# Patient Record
Sex: Female | Born: 1955
Health system: Southern US, Community
[De-identification: ages and names within clinical notes are randomized; demographics above are authoritative.]

## PROBLEM LIST (undated history)

## (undated) DIAGNOSIS — I10 Essential (primary) hypertension: Secondary | ICD-10-CM

## (undated) DIAGNOSIS — K219 Gastro-esophageal reflux disease without esophagitis: Secondary | ICD-10-CM

## (undated) DIAGNOSIS — R519 Headache, unspecified: Secondary | ICD-10-CM

## (undated) DIAGNOSIS — R51 Headache: Secondary | ICD-10-CM

## (undated) DIAGNOSIS — R32 Unspecified urinary incontinence: Secondary | ICD-10-CM

## (undated) HISTORY — DX: Unspecified urinary incontinence: R32

## (undated) HISTORY — DX: Headache: R51

## (undated) HISTORY — DX: Gastro-esophageal reflux disease without esophagitis: K21.9

## (undated) HISTORY — DX: Essential (primary) hypertension: I10

## (undated) HISTORY — DX: Headache, unspecified: R51.9

## (undated) HISTORY — PX: TUBAL LIGATION: SHX77

---

## 1983-12-01 HISTORY — PX: ABDOMINAL HYSTERECTOMY: SHX81

## 2006-08-09 ENCOUNTER — Ambulatory Visit: Payer: Self-pay | Admitting: Internal Medicine

## 2006-09-20 ENCOUNTER — Ambulatory Visit: Payer: Self-pay | Admitting: Internal Medicine

## 2006-09-21 ENCOUNTER — Ambulatory Visit: Payer: Self-pay | Admitting: Internal Medicine

## 2008-04-24 ENCOUNTER — Ambulatory Visit: Payer: Self-pay

## 2011-11-03 ENCOUNTER — Encounter: Payer: Self-pay | Admitting: Internal Medicine

## 2011-12-29 ENCOUNTER — Ambulatory Visit: Payer: Self-pay

## 2012-01-07 ENCOUNTER — Ambulatory Visit: Payer: Self-pay

## 2013-08-15 ENCOUNTER — Encounter: Payer: Self-pay | Admitting: Internal Medicine

## 2013-08-21 ENCOUNTER — Encounter: Payer: Self-pay | Admitting: Internal Medicine

## 2013-12-12 ENCOUNTER — Encounter: Payer: Self-pay | Admitting: Adult Health

## 2013-12-12 ENCOUNTER — Encounter (INDEPENDENT_AMBULATORY_CARE_PROVIDER_SITE_OTHER): Payer: No Typology Code available for payment source | Admitting: Adult Health

## 2013-12-12 NOTE — Progress Notes (Signed)
Pre visit review using our clinic review tool, if applicable. No additional management support is needed unless otherwise documented below in the visit note. 

## 2013-12-14 NOTE — Progress Notes (Signed)
This encounter was created in error - please disregard.  This encounter was created in error - please disregard.

## 2014-01-03 ENCOUNTER — Ambulatory Visit: Payer: No Typology Code available for payment source | Admitting: Adult Health

## 2014-05-15 ENCOUNTER — Ambulatory Visit: Payer: Self-pay | Admitting: Pain Medicine

## 2014-06-07 ENCOUNTER — Ambulatory Visit: Payer: Self-pay | Admitting: Gastroenterology

## 2014-12-01 ENCOUNTER — Inpatient Hospital Stay: Payer: Self-pay | Admitting: Internal Medicine

## 2014-12-01 LAB — CBC WITH DIFFERENTIAL/PLATELET
BASOS ABS: 0.2 10*3/uL — AB (ref 0.0–0.1)
Basophil %: 1 %
EOS ABS: 0.1 10*3/uL (ref 0.0–0.7)
Eosinophil %: 0.5 %
HCT: 28.9 % — ABNORMAL LOW (ref 35.0–47.0)
HGB: 9.6 g/dL — AB (ref 12.0–16.0)
LYMPHS PCT: 17.2 %
Lymphocyte #: 3 10*3/uL (ref 1.0–3.6)
MCH: 29.6 pg (ref 26.0–34.0)
MCHC: 33.1 g/dL (ref 32.0–36.0)
MCV: 90 fL (ref 80–100)
MONO ABS: 0.7 x10 3/mm (ref 0.2–0.9)
Monocyte %: 4 %
NEUTROS PCT: 77.3 %
Neutrophil #: 13.6 10*3/uL — ABNORMAL HIGH (ref 1.4–6.5)
PLATELETS: 359 10*3/uL (ref 150–440)
RBC: 3.23 10*6/uL — AB (ref 3.80–5.20)
RDW: 13.2 % (ref 11.5–14.5)
WBC: 17.6 10*3/uL — ABNORMAL HIGH (ref 3.6–11.0)

## 2014-12-01 LAB — PROTIME-INR
INR: 1
Prothrombin Time: 13.3 secs (ref 11.5–14.7)

## 2014-12-01 LAB — COMPREHENSIVE METABOLIC PANEL
ANION GAP: 10 (ref 7–16)
AST: 28 U/L (ref 15–37)
Albumin: 3.6 g/dL (ref 3.4–5.0)
Alkaline Phosphatase: 94 U/L
BILIRUBIN TOTAL: 0.2 mg/dL (ref 0.2–1.0)
BUN: 52 mg/dL — ABNORMAL HIGH (ref 7–18)
CALCIUM: 8.8 mg/dL (ref 8.5–10.1)
CO2: 25 mmol/L (ref 21–32)
CREATININE: 1.35 mg/dL — AB (ref 0.60–1.30)
Chloride: 101 mmol/L (ref 98–107)
EGFR (Non-African Amer.): 43 — ABNORMAL LOW
GFR CALC AF AMER: 52 — AB
Glucose: 150 mg/dL — ABNORMAL HIGH (ref 65–99)
Osmolality: 289 (ref 275–301)
Potassium: 4.1 mmol/L (ref 3.5–5.1)
SGPT (ALT): 30 U/L
Sodium: 136 mmol/L (ref 136–145)
TOTAL PROTEIN: 7.5 g/dL (ref 6.4–8.2)

## 2014-12-01 LAB — HEMOGLOBIN: HGB: 6.7 g/dL — AB (ref 12.0–16.0)

## 2014-12-01 LAB — LIPASE, BLOOD: LIPASE: 137 U/L (ref 73–393)

## 2014-12-01 LAB — APTT: Activated PTT: 27.6 secs (ref 23.6–35.9)

## 2014-12-02 LAB — CBC WITH DIFFERENTIAL/PLATELET
BASOS ABS: 0.1 10*3/uL (ref 0.0–0.1)
Basophil %: 0.6 %
Eosinophil #: 0.2 10*3/uL (ref 0.0–0.7)
Eosinophil %: 1.6 %
HCT: 31.3 % — AB (ref 35.0–47.0)
HGB: 10.6 g/dL — ABNORMAL LOW (ref 12.0–16.0)
LYMPHS ABS: 3.8 10*3/uL — AB (ref 1.0–3.6)
Lymphocyte %: 34.4 %
MCH: 31.1 pg (ref 26.0–34.0)
MCHC: 33.8 g/dL (ref 32.0–36.0)
MCV: 92 fL (ref 80–100)
MONO ABS: 0.8 x10 3/mm (ref 0.2–0.9)
Monocyte %: 7.2 %
Neutrophil #: 6.2 10*3/uL (ref 1.4–6.5)
Neutrophil %: 56.2 %
PLATELETS: 214 10*3/uL (ref 150–440)
RBC: 3.4 10*6/uL — ABNORMAL LOW (ref 3.80–5.20)
RDW: 13.3 % (ref 11.5–14.5)
WBC: 11 10*3/uL (ref 3.6–11.0)

## 2014-12-02 LAB — HEMOGLOBIN: HGB: 10 g/dL — ABNORMAL LOW (ref 12.0–16.0)

## 2014-12-02 LAB — BASIC METABOLIC PANEL
ANION GAP: 6 — AB (ref 7–16)
BUN: 31 mg/dL — ABNORMAL HIGH (ref 7–18)
CALCIUM: 7.6 mg/dL — AB (ref 8.5–10.1)
Chloride: 113 mmol/L — ABNORMAL HIGH (ref 98–107)
Co2: 21 mmol/L (ref 21–32)
Creatinine: 0.97 mg/dL (ref 0.60–1.30)
EGFR (Non-African Amer.): >60
Glucose: 96 mg/dL (ref 65–99)
Osmolality: 286 (ref 275–301)
Potassium: 4.1 mmol/L (ref 3.5–5.1)
Sodium: 140 mmol/L (ref 136–145)

## 2015-01-05 DIAGNOSIS — K922 Gastrointestinal hemorrhage, unspecified: Secondary | ICD-10-CM | POA: Insufficient documentation

## 2015-03-27 NOTE — Consult Note (Signed)
PATIENT NAME:  Priscilla Meza, Priscilla Meza MR#:  [id number] DATE OF BIRTH:  12-06-1955  DATE OF CONSULTATION:  12/02/2014  REFERRING PHYSICIAN:  Santina Evans P. Clent Ridges, MD  CONSULTING PHYSICIAN:  Dow Adolph, MD  REASON FOR CONSULTATION: Melena, hematemesis, anemia.   HISTORY OF PRESENT ILLNESS: Priscilla Meza is a 59 year old female with a past medical history notable for iron-deficiency anemia, hypertension and chronic back pain who is presenting to the Emergency Room for evaluation of melena, hematemesis, weakness and fatigue. Priscilla Meza does have a history of anemia. Of note, Priscilla Meza did undergo a colonoscopy and upper endoscopy back in July 2015 by Dr. Daleen Squibb for iron deficiency anemia. No source of anemia was found at that time. Priscilla Meza did have some mild gastritis on her upper endoscopy. On her colonoscopy, Priscilla Meza only had nonbleeding internal hemorrhoids.   Currently, Priscilla Meza reports that Priscilla Meza has had multiple episodes of black bowel movements in the past 24 hours. Priscilla Meza also had several episodes of dark brown/black vomitus. In addition, Priscilla Meza was feeling very weak, lightheaded and dizzy at home. Priscilla Meza presented to the Emergency Room and was found to be hypotensive in there with a systolic in the 90s. Her hemoglobin was 9.6 on presentation. Priscilla Meza does admit to taking intermittent Goody powder for her back pain.   Upon being admitted to the hospital, Priscilla Meza was noted to continue to be hypotensive. Priscilla Meza had a repeat hemoglobin of 6.9 and received 2 units of packed red blood cells with a response to 10.   PAST MEDICAL HISTORY:  1.  Iron-deficiency anemia.  2.  Gastritis.  3.  Hypertension.  4.  Shingles.  5.  Hyperlipidemia.  6.  Chronic low back pain.   PAST SURGICAL HISTORY: Just a hysterectomy.   SOCIAL HISTORY: Priscilla Meza was a former smoker. Priscilla Meza does now use vapor cigarette. Priscilla Meza denies alcohol.   FAMILY HISTORY: Priscilla Meza does have a grandmother with colon cancer.   ALLERGIES: No allergies.   HOME MEDICATIONS:  1.  Vitamin D 50,000  units daily.  2.  Omeprazole 20 mg daily.  3.  Norco 325/5 t.i.d.  4.  Lisinopril 20 mg daily.  5.  Hydrochlorothiazide 25 mg daily. 6.  Acyclovir 800 mg 5 times a day.   REVIEW OF SYSTEMS: A 10-system review was conducted and is negative except as stated in the HPI.   PHYSICAL EXAMINATION:  VITAL SIGNS: Currently, her temperature is 97.6, pulse 69, respirations 18, blood pressure 125/71, pulse oximetry 98% on room air.  GENERAL: Alert and oriented times 4.  No acute distress. Appears stated age. HEENT: Normocephalic/atraumatic. Extraocular movements are intact. Anicteric. NECK: Soft, supple. JVP appears normal. No adenopathy. CHEST: Clear to auscultation. No wheeze or crackle. Respirations unlabored. HEART: Regular. No murmur, rub, or gallop.  Normal S1 and S2. ABDOMEN: Soft, nontender, nondistended.  Normal active bowel sounds in all four quadrants.  No organomegaly. No masses EXTREMITIES: No swelling, well perfused. SKIN: No rash or lesion. Skin color, texture, turgor normal. NEUROLOGICAL: Grossly intact. PSYCHIATRIC: Normal tone and affect. MUSCULOSKELETAL: No joint swelling or erythema.    LABORATORY DATA: Currently her sodium is 140, potassium 4.1, BUN 31, creatinine 0.97. Lipase is normal. Liver enzymes are normal. White count 11, current hemoglobin 10.6, hematocrit 31, platelets 214,000. Her INR is 1.0.   ASSESSMENT:  Upper gastrointestinal bleed. Although Priscilla Meza did have an EGD back in July 2015, Priscilla Meza does appear to have had a significant upper GI bleed with a large drop in her hemoglobin from baseline and also  evidence of hemodynamic changes. Fortunately, Priscilla Meza has responded well to PPI drip and packed red blood cells.   RECOMMENDATIONS:  1.  N.p.o. after midnight.  2.  We will perform an EGD today for diagnostic and potentially therapeutic purposes.  3.  Continue PPI drip.  4.  Check H. pylori serology.  5.  Further recommendations pending above.   Thank you for this consult.     ____________________________ Dow AdolphMatthew Rein, MD mr:JT D: 12/02/2014 12:36:00 ET T: 12/02/2014 14:42:35 ET JOB#: [id number]  cc: Dow AdolphMatthew Rein, MD, <Dictator> Kathalene FramesMATTHEW G REIN MD ELECTRONICALLY SIGNED 12/03/2014 14:19

## 2015-03-27 NOTE — H&P (Signed)
PATIENT NAME:  Priscilla Meza, Priscilla Meza MR#:  [id number] DATE OF BIRTH:  July 21, 1956  DATE OF ADMISSION:  12/01/2014  PRIMARY CARE PHYSICIAN: Dr. Brooke Dare.   PRIMARY GASTROENTEROLOGIST: Midge Minium, MD.   REFERRING EMERGENCY ROOM PHYSICIAN: Glennie Isle, MD.   CHIEF COMPLAINT: Melena and hematochezia.   HISTORY OF PRESENT ILLNESS: This very pleasant, 59 year old woman with past medical history of iron-deficiency anemia, hypertension, gastritis seen on EGD in July of 2015, presents today with 24 hours of dark, black, tarry stools. She reports that she has had some abdominal cramping, multiple episodes of black stool, began to feel very weak. Today was so weak she was unable to move easily around her house without dizziness. She finally called EMS and when EMS arrived, she had 3 episodes of coffee-ground emesis. At the time of presentation to the emergency room, she is slightly hypotensive with a blood pressure of 97/62. Her hemoglobin is 9.6. She does report that she 2 Goody's powders daily and uses naproxen at least every other day for back pain.  PAST MEDICAL HISTORY: 1. Iron deficiency anemia.  2. Gastritis.  3. Hypertension.  4. Shingles.  5. Hyperlipidemia.  6. Chronic low back pain.   PAST SURGICAL HISTORY: Partial hysterectomy.   SOCIAL HISTORY: The patient lives alone. She smoked cigarettes until February of 2015 and now she uses a vapor cigarette. She does not drink alcohol. No illicit substance abuse. She is not currently working.   FAMILY MEDICAL HISTORY: Negative for coronary artery disease or stroke. One of her grandmothers had colon cancer. Her mother had diverticulitis.   ALLERGIES: No known allergies.   HOME MEDICATIONS: 1. Vitamin D 50,000 units 1 tablet once a week.  2. Omeprazole 20 mg 1 tablet once a day.  3. Norco 325 mg/5 mg orally 3 times a day as needed for pain.  4. Loperamide 2 mg 1 tablet every 4 hours as needed.  5. Lisinopril 20 mg 1 tablet once a day.   6. Hydrochlorothiazide 25 mg 1 tablet once a day.  7. Acyclovir 800 mg 1 tablet 5 times a day.   REVIEW OF SYSTEMS: CONSTITUTIONAL: Negative for fevers or chills. Positive for weakness, fatigue, abdominal pain. No change in weight.   HEENT: No change in vision or hearing. No pain in eyes or ears. No sore throat, difficulty swallowing or sinus pain.   RESPIRATORY: No cough, wheezing, hemoptysis or dyspnea.   CARDIOVASCULAR: No chest pain, orthopnea, edema or palpitations.   GASTROINTESTINAL: Positive as above for nausea, vomiting, diarrhea, abdominal pain, hematemesis, melena, but not hematochezia.   GENITOURINARY: Negative for dysuria or frequency.   MUSCULOSKELETAL: She does have chronic back pain. No new pain in the neck, back, shoulders, knees or hips. She does have a history of arthritis. No gout.   NEUROLOGIC: No focal numbness or weakness. No dysarthria. No history of CVA or seizure. No dementia.   PSYCHIATRIC: No uncontrolled depression or anxiety. No bipolar disorder or schizophrenia.   PHYSICAL EXAMINATION: VITAL SIGNS: Temperature 98.2, pulse 90, respirations 20, blood pressure 97/62, oxygenation 98% on room air.   GENERAL: The patient appears very fatigued and weak.   HEENT: Pupils are equal, round and reactive to light. Conjunctivae are clear. Extraocular motion intact. Mucous membranes are dry. There is blood caking on the teeth. Posterior oropharynx clear with no exudate, edema or erythema.   NECK: Supple. Trachea midline. No cervical lymphadenopathy. Thyroid nontender.   RESPIRATORY: Lungs clear to auscultation bilaterally on anterior examination.   CARDIAC: Tachycardic,  regular. No peripheral edema. Peripheral pulses are 2+.   ABDOMEN: Soft, diffusely tender. She localizes to be left upper quadrant. No guarding, no rebound, no mass, no hepatosplenomegaly.   MUSCULOSKELETAL: No joint effusions. Range of motion is normal, strength is 5/5 throughout.   SKIN: On  the left neck, underneath the hairline, there is an area of several small scabs. No blistering. No Exudates.    NEUROLOGIC: Cranial nerves II through XII grossly intact. Strength and sensation intact. Tone is normal. Nonfocal examination.   PSYCHIATRIC: The patient is oriented x 4. She is fairly fatigued. No signs of uncontrolled depression or anxiety at this time.   LABORATORY DATA: Sodium 136, potassium 4.0, chloride 101, bicarbonate 25, BUN 52, creatinine 1.35, glucose 150, lipase 137. LFTs are normal. White blood cells 17.6, hemoglobin 9.6, platelets 359,000, MCV is 90. INR is 1.0.   IMAGING: Three-way abdominal film shows no acute findings.   ASSESSMENT AND PLAN: 1. Acute gastrointestinal bleed: Likely an upper gastroinestinal bleed given hematemesis. We will recheck her CBC every 6 hours. We will type and screen for 2 units. I do not know what her baseline hemoglobin is. She does have a history of anemia. Would transfuse if she goes below 7. I have consulted gastroenterology and spoken to Dr. Shelle Ironein about this patient. She will need an esophagogastroduodenoscopy. The patient is on a Protonix drip. Upper gastroinestinal bleed is most likely due to nonsteroidal anti-inflammatory use.  2. Hypotension: The patient has chronic hypertension and reports that it is poorly controlled even on lisinopril and hydrochlorothiazide. Currently, she is hypotensive. I will hold off on her antihypertensive medications. Provide IV fluids. Continue to monitor her closely.  3. Acute blood loss anemia: I do not have a starting hemoglobin for her. Currently, it is 9.6. I have 2 units typed and crossed and ready for transfusion should her next CBC show a hemoglobin less than 7.  4. Acute kidney injury: I do not have a baseline creatinine for this patient. We will provide IV hydration. Continue to monitor.  5. Leukocytosis: This is likely due to gastroinestinal bleed: Chest x-ray shows no pneumonia. We will obtain a  urinalysis as well.  6. Hypertension: Hold antihypertensives.  7. Hyperlipidemia: Continue statin once she is taking p.o.  8. Shingles: Seems to be improving with valacyclovir. Continue when she is taking p.o.  9. Prophylaxis: She is on a proton pump inhibitor drip. No pharmacological deep venous thrombosis prophylaxis as she is having gastrointestinal bleed.   TIME SPENT ON ADMISSION: 50 minutes.     ____________________________ Ena Dawleyatherine P. Clent RidgesWalsh, MD cpw:TT D: 12/01/2014 19:22:20 ET T: 12/01/2014 19:39:58 ET JOB#: [id number]  cc: Santina Evansatherine P. Clent RidgesWalsh, MD, <Dictator> Gale JourneyATHERINE P Jernee Murtaugh MD ELECTRONICALLY SIGNED 12/02/2014 20:11

## 2015-03-31 NOTE — Consult Note (Signed)
Brief Consult Note: Diagnosis: UGI Bleed.   Patient was seen by consultant.   Consult note dictated.   Recommend to proceed with surgery or procedure.   Discussed with Attending MD.   Comments: UGIB.  Anemia.   - EGD today - cont PPI drip for now.  Electronic Signatures: Dow Adolphein, Matthew (MD)  (Signed 03-Jan-16 12:31)  Authored: Brief Consult Note   Last Updated: 03-Jan-16 12:31 by Dow Adolphein, Matthew (MD)

## 2015-03-31 NOTE — Consult Note (Signed)
Details:   - GI NOte:  EGD:  single angioectasia treated. Also gastric erosions.   Recs: - protonix 40 BID for 1 month then daily - no nsaids - check h pylori serology  - safe for d/c this afternoon if repeat Hgb stable.   Electronic Signatures: Dow Adolphein, Matthew (MD)  (Signed 03-Jan-16 14:18)  Authored: Details   Last Updated: 03-Jan-16 14:18 by Dow Adolphein, Matthew (MD)

## 2015-03-31 NOTE — Discharge Summary (Signed)
PATIENT NAME:  Priscilla Meza, Priscilla Meza MR#:  [id number] DATE OF BIRTH:  [birth year]  DATE OF ADMISSION:  12/01/2014 DATE OF DISCHARGE:  12/02/2014  PRIMARY CARE PHYSICIAN: Dr. Vear ClockPhillips.  DISCHARGE DIAGNOSES: 1.  Gastrointestinal bleeding with single angioectasia and gastric erosions.  2.  Acute blood loss anemia due to gastrointestinal bleeding.  3.  Acute renal failure.  4.  Hypertension.  5.  Hyperlipidemia.   CONDITION: Stable.   CODE STATUS: FULL CODE.   HOME MEDICATIONS: Please refer to the medication reconciliation list.   DIET: Low-sodium diet.   ACTIVITY: As tolerated.   FOLLOWUP CARE: Follow up with PCP within 1 to 2 weeks. Follow up with Dr. Shelle Ironein within 1 to 2 weeks.   REASON FOR ADMISSION: Melena and hematochezia.   HOSPITAL COURSE: The patient is a 59 year old Caucasian female with a history of iron deficiency anemia, hypertension, gastritis, who presented to the ED with melena and hematochezia. The patient's blood pressure was low to 97/62. The patient did report that she had a Goody's powder daily and uses naproxen at least every other day for back pain. For detailed history and physical examination, please refer to the admission note dictated by Dr. Clent RidgesWalsh. On admission date, the patient's hemoglobin was 9.6, WBC 17.6, BUN 52, creatinine 1.35.  1.  Acute GI bleeding with acute blood loss anemia: The patient has been treated with Protonix drip with normal saline fluid support. Dr. Shelle Ironein did an EGD, which showed a single angioectasia which was treated. Also, EGD showed gastric erosions. Dr. Shelle Ironein suggested Protonix 40 mg b.i.d. for 1 month, then change to daily. If hemoglobin is stable, the patient may be discharged this afternoon.  2.  Hypertension: The patient's blood pressure was on the low side. The patient's hypertension medication was on hold. The patient probably will need to resume.  3.  Leukocytosis, improved.   The patient has no complaints. Vital signs are stable. She is  clinically stable and will be discharged to home today. I discussed the patient's discharge plan with the patient, the patient's daughter, and nurse.   TIME SPENT: About 42 minutes.    ____________________________ Shaune PollackQing Makynzi Eastland, MD qc:ts D: 12/02/2014 15:04:52 ET T: 12/02/2014 18:16:12 ET JOB#: [id number]  cc: Shaune PollackQing Drey Shaff, MD, <Dictator> Shaune PollackQING Adarsh Mundorf MD ELECTRONICALLY SIGNED 12/03/2014 17:04

## 2015-05-30 ENCOUNTER — Other Ambulatory Visit: Payer: Self-pay

## 2015-05-30 ENCOUNTER — Ambulatory Visit (INDEPENDENT_AMBULATORY_CARE_PROVIDER_SITE_OTHER): Payer: PRIVATE HEALTH INSURANCE | Admitting: Physician Assistant

## 2015-05-30 ENCOUNTER — Encounter: Payer: Self-pay | Admitting: Physician Assistant

## 2015-05-30 VITALS — BP 140/78 | HR 66 | Temp 98.1°F | Resp 16 | Ht 61.0 in | Wt 160.8 lb

## 2015-05-30 DIAGNOSIS — K219 Gastro-esophageal reflux disease without esophagitis: Secondary | ICD-10-CM | POA: Insufficient documentation

## 2015-05-30 DIAGNOSIS — M545 Low back pain: Secondary | ICD-10-CM | POA: Diagnosis not present

## 2015-05-30 DIAGNOSIS — R32 Unspecified urinary incontinence: Secondary | ICD-10-CM

## 2015-05-30 DIAGNOSIS — R011 Cardiac murmur, unspecified: Secondary | ICD-10-CM | POA: Diagnosis not present

## 2015-05-30 DIAGNOSIS — G8929 Other chronic pain: Secondary | ICD-10-CM | POA: Diagnosis not present

## 2015-05-30 DIAGNOSIS — I10 Essential (primary) hypertension: Secondary | ICD-10-CM | POA: Insufficient documentation

## 2015-05-30 DIAGNOSIS — D649 Anemia, unspecified: Secondary | ICD-10-CM | POA: Insufficient documentation

## 2015-05-30 DIAGNOSIS — Z0000 Encounter for general adult medical examination without abnormal findings: Secondary | ICD-10-CM | POA: Diagnosis not present

## 2015-05-30 DIAGNOSIS — E7800 Pure hypercholesterolemia, unspecified: Secondary | ICD-10-CM | POA: Insufficient documentation

## 2015-05-30 DIAGNOSIS — M5136 Other intervertebral disc degeneration, lumbar region: Secondary | ICD-10-CM

## 2015-05-30 DIAGNOSIS — Z1239 Encounter for other screening for malignant neoplasm of breast: Secondary | ICD-10-CM | POA: Diagnosis not present

## 2015-05-30 MED ORDER — HYDROCODONE-ACETAMINOPHEN 5-325 MG PO TABS: 1.0000 | ORAL_TABLET | Freq: Four times a day (QID) | ORAL | Status: DC | PRN
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
## 2015-05-30 NOTE — Patient Instructions (Signed)

## 2015-06-26 ENCOUNTER — Encounter: Payer: Self-pay | Admitting: Physician Assistant

## 2015-06-26 ENCOUNTER — Telehealth: Payer: Self-pay | Admitting: Physician Assistant

## 2015-06-26 DIAGNOSIS — M545 Low back pain: Principal | ICD-10-CM

## 2015-06-26 DIAGNOSIS — G8929 Other chronic pain: Secondary | ICD-10-CM

## 2015-06-26 MED ORDER — HYDROCODONE-ACETAMINOPHEN 5-325 MG PO TABS: 1.0000 | ORAL_TABLET | Freq: Four times a day (QID) | ORAL | Status: DC | PRN

## 2015-06-26 NOTE — Addendum Note (Signed)
Addended by: Margaretann Loveless on: 06/26/2015 01:45 PM   Modules accepted: Orders

## 2015-07-16 ENCOUNTER — Other Ambulatory Visit: Payer: Self-pay | Admitting: Physician Assistant

## 2015-07-16 DIAGNOSIS — G8929 Other chronic pain: Secondary | ICD-10-CM

## 2015-07-16 DIAGNOSIS — M545 Low back pain: Principal | ICD-10-CM

## 2015-07-17 ENCOUNTER — Telehealth: Payer: Self-pay | Admitting: Physician Assistant

## 2015-07-17 MED ORDER — HYDROCODONE-ACETAMINOPHEN 5-325 MG PO TABS: 1.0000 | ORAL_TABLET | ORAL | Status: DC | PRN

## 2015-07-17 NOTE — Telephone Encounter (Signed)
Patient advised RX will be ready for pick up this afternoon.

## 2015-07-17 NOTE — Telephone Encounter (Signed)
Pt contacted office for refill request on the following medications: HYDROcodone-acetaminophen (NORCO/VICODIN) 5-325 MG per tablet. Pt stated that she will be out tomorrow night. Pt stated that she requested online but she knew the system was down this morning and she wanted to make sure we got the request. Pt stated she is leaving to go out of town and she wants to make sure she gets it filled before she leaves. Thanks TNP

## 2015-07-17 NOTE — Addendum Note (Signed)
Addended by: Margaretann Loveless on: 07/17/2015 01:43 PM   Modules accepted: Orders

## 2015-07-17 NOTE — Telephone Encounter (Signed)
I will print this as soon as I get to the office.  I did receive the message yesterday but was out of the office so I could not have signed it.  It will be ready for pick-up this afternoon.  Thanks!

## 2015-08-14 ENCOUNTER — Other Ambulatory Visit: Payer: Self-pay | Admitting: Physician Assistant

## 2015-08-14 DIAGNOSIS — M5136 Other intervertebral disc degeneration, lumbar region: Secondary | ICD-10-CM

## 2015-08-14 DIAGNOSIS — G8929 Other chronic pain: Secondary | ICD-10-CM

## 2015-08-14 DIAGNOSIS — M545 Low back pain: Secondary | ICD-10-CM

## 2015-08-15 MED ORDER — HYDROCODONE-ACETAMINOPHEN 5-325 MG PO TABS: 1.0000 | ORAL_TABLET | ORAL | Status: DC | PRN

## 2015-08-15 NOTE — Telephone Encounter (Signed)
Please notify patient I will print once I get to the office and should be ready for pick up anytime after 130 today and before closing at 5pm.  Thanks.

## 2015-09-12 ENCOUNTER — Other Ambulatory Visit: Payer: Self-pay | Admitting: Physician Assistant

## 2015-09-12 DIAGNOSIS — G8929 Other chronic pain: Secondary | ICD-10-CM

## 2015-09-12 DIAGNOSIS — M545 Low back pain, unspecified: Secondary | ICD-10-CM

## 2015-09-12 DIAGNOSIS — K0510 Chronic gingivitis, plaque induced: Secondary | ICD-10-CM

## 2015-09-12 MED ORDER — CLINDAMYCIN HCL 150 MG PO CAPS: 150.0000 mg | ORAL_CAPSULE | Freq: Three times a day (TID) | ORAL | Status: DC

## 2015-09-12 MED ORDER — HYDROCODONE-ACETAMINOPHEN 5-325 MG PO TABS: 1.0000 | ORAL_TABLET | ORAL | Status: DC | PRN

## 2015-09-12 NOTE — Addendum Note (Signed)
Addended by: Margaretann LovelessBURNETTE, JENNIFER M on: 09/12/2015 01:37 PM   Modules accepted: Orders

## 2015-10-10 ENCOUNTER — Other Ambulatory Visit: Payer: Self-pay | Admitting: Physician Assistant

## 2015-10-10 DIAGNOSIS — M5136 Other intervertebral disc degeneration, lumbar region: Secondary | ICD-10-CM

## 2015-10-10 DIAGNOSIS — G8929 Other chronic pain: Secondary | ICD-10-CM

## 2015-10-10 DIAGNOSIS — M545 Low back pain: Secondary | ICD-10-CM

## 2015-10-10 MED ORDER — HYDROCODONE-ACETAMINOPHEN 5-325 MG PO TABS: 1.0000 | ORAL_TABLET | ORAL | Status: DC | PRN

## 2015-11-04 ENCOUNTER — Other Ambulatory Visit: Payer: Self-pay | Admitting: Physician Assistant

## 2015-11-04 DIAGNOSIS — E7800 Pure hypercholesterolemia, unspecified: Secondary | ICD-10-CM

## 2015-11-04 DIAGNOSIS — I10 Essential (primary) hypertension: Secondary | ICD-10-CM

## 2015-11-06 ENCOUNTER — Other Ambulatory Visit: Payer: Self-pay | Admitting: Physician Assistant

## 2015-11-06 DIAGNOSIS — M545 Low back pain: Principal | ICD-10-CM

## 2015-11-06 DIAGNOSIS — G8929 Other chronic pain: Secondary | ICD-10-CM

## 2015-11-06 MED ORDER — HYDROCODONE-ACETAMINOPHEN 5-325 MG PO TABS: 1.0000 | ORAL_TABLET | ORAL | Status: DC | PRN

## 2015-12-05 ENCOUNTER — Other Ambulatory Visit: Payer: Self-pay | Admitting: Physician Assistant

## 2015-12-05 DIAGNOSIS — M545 Low back pain, unspecified: Secondary | ICD-10-CM

## 2015-12-05 DIAGNOSIS — G8929 Other chronic pain: Secondary | ICD-10-CM

## 2015-12-05 MED ORDER — HYDROCODONE-ACETAMINOPHEN 5-325 MG PO TABS: 1.0000 | ORAL_TABLET | ORAL | Status: DC | PRN

## 2015-12-05 NOTE — Telephone Encounter (Signed)
Pt contacted office for refill request on the following medications: °HYDROcodone-acetaminophen (NORCO/VICODIN) 5-325 MG tablet. Thanks TNP °

## 2016-01-01 ENCOUNTER — Other Ambulatory Visit: Payer: Self-pay | Admitting: Physician Assistant

## 2016-01-01 DIAGNOSIS — G8929 Other chronic pain: Secondary | ICD-10-CM

## 2016-01-01 DIAGNOSIS — M545 Low back pain, unspecified: Secondary | ICD-10-CM

## 2016-01-01 MED ORDER — HYDROCODONE-ACETAMINOPHEN 5-325 MG PO TABS: 1.0000 | ORAL_TABLET | ORAL | Status: DC | PRN

## 2016-01-28 ENCOUNTER — Other Ambulatory Visit: Payer: Self-pay | Admitting: Physician Assistant

## 2016-01-28 DIAGNOSIS — M545 Low back pain: Principal | ICD-10-CM

## 2016-01-28 DIAGNOSIS — I10 Essential (primary) hypertension: Secondary | ICD-10-CM

## 2016-01-28 DIAGNOSIS — G8929 Other chronic pain: Secondary | ICD-10-CM

## 2016-01-28 MED ORDER — HYDROCODONE-ACETAMINOPHEN 5-325 MG PO TABS: 1.0000 | ORAL_TABLET | ORAL | Status: DC | PRN

## 2016-02-19 ENCOUNTER — Ambulatory Visit
Admission: RE | Admit: 2016-02-19 | Discharge: 2016-02-19 | Disposition: A | Discharge status: Home / Self Care | Payer: Managed Care, Other (non HMO) | Source: Ambulatory Visit | Attending: Physician Assistant | Admitting: Physician Assistant

## 2016-02-19 DIAGNOSIS — Z1231 Encounter for screening mammogram for malignant neoplasm of breast: Secondary | ICD-10-CM | POA: Insufficient documentation

## 2016-02-19 DIAGNOSIS — Z1239 Encounter for other screening for malignant neoplasm of breast: Secondary | ICD-10-CM

## 2016-02-20 ENCOUNTER — Telehealth: Payer: Self-pay

## 2016-02-20 ENCOUNTER — Other Ambulatory Visit: Payer: Self-pay | Admitting: Physician Assistant

## 2016-02-20 DIAGNOSIS — G8929 Other chronic pain: Secondary | ICD-10-CM

## 2016-02-20 DIAGNOSIS — M545 Low back pain: Principal | ICD-10-CM

## 2016-02-20 MED ORDER — HYDROCODONE-ACETAMINOPHEN 5-325 MG PO TABS: 1.0000 | ORAL_TABLET | ORAL | Status: DC | PRN

## 2016-02-20 NOTE — Telephone Encounter (Signed)
-----   Message from Margaretann LovelessJennifer M Burnette, [place]PA-C sent at 02/20/2016  3:09 PM EDT ----- Normal mammogram. Repeat screening in one year.

## 2016-02-20 NOTE — Telephone Encounter (Signed)
Left patient a voicemail advising her that mammogram was normal (consent to leave message in chart)

## 2016-03-18 ENCOUNTER — Other Ambulatory Visit: Payer: Self-pay | Admitting: Physician Assistant

## 2016-03-18 DIAGNOSIS — K0510 Chronic gingivitis, plaque induced: Secondary | ICD-10-CM

## 2016-03-18 MED ORDER — CLINDAMYCIN HCL 150 MG PO CAPS: 150.0000 mg | ORAL_CAPSULE | Freq: Three times a day (TID) | ORAL | Status: DC

## 2016-03-20 ENCOUNTER — Other Ambulatory Visit: Payer: Self-pay | Admitting: Physician Assistant

## 2016-03-20 DIAGNOSIS — M545 Low back pain: Principal | ICD-10-CM

## 2016-03-20 DIAGNOSIS — G8929 Other chronic pain: Secondary | ICD-10-CM

## 2016-03-20 MED ORDER — HYDROCODONE-ACETAMINOPHEN 5-325 MG PO TABS: 1.0000 | ORAL_TABLET | ORAL | Status: DC | PRN

## 2016-04-15 ENCOUNTER — Telehealth: Payer: Self-pay

## 2016-04-15 ENCOUNTER — Other Ambulatory Visit: Payer: Self-pay | Admitting: Physician Assistant

## 2016-04-15 DIAGNOSIS — M545 Low back pain: Principal | ICD-10-CM

## 2016-04-15 DIAGNOSIS — G8929 Other chronic pain: Secondary | ICD-10-CM

## 2016-04-15 MED ORDER — HYDROCODONE-ACETAMINOPHEN 5-325 MG PO TABS: 1.0000 | ORAL_TABLET | ORAL | Status: DC | PRN

## 2016-04-15 NOTE — Telephone Encounter (Signed)
Patient advised that RX prescription for Norco/Vicodin is up front ready for pick up.  Thanks,  -Armondo Cech

## 2016-04-15 NOTE — Addendum Note (Signed)
Addended by: Margaretann LovelessBURNETTE, Lashonda Sonneborn M on: 04/15/2016 04:00 PM   Modules accepted: Orders

## 2016-05-13 ENCOUNTER — Other Ambulatory Visit: Payer: Self-pay | Admitting: Physician Assistant

## 2016-05-13 DIAGNOSIS — M545 Low back pain, unspecified: Secondary | ICD-10-CM

## 2016-05-13 DIAGNOSIS — G8929 Other chronic pain: Secondary | ICD-10-CM

## 2016-05-13 MED ORDER — HYDROCODONE-ACETAMINOPHEN 5-325 MG PO TABS: 1.0000 | ORAL_TABLET | ORAL | Status: DC | PRN

## 2016-05-13 NOTE — Addendum Note (Signed)
Addended by: Margaretann LovelessBURNETTE, Tyon Cerasoli M on: 05/13/2016 10:18 AM   Modules accepted: Orders

## 2016-06-09 ENCOUNTER — Other Ambulatory Visit: Payer: Self-pay | Admitting: Physician Assistant

## 2016-06-09 DIAGNOSIS — M545 Low back pain: Principal | ICD-10-CM

## 2016-06-09 DIAGNOSIS — G8929 Other chronic pain: Secondary | ICD-10-CM

## 2016-06-09 MED ORDER — HYDROCODONE-ACETAMINOPHEN 5-325 MG PO TABS: 1.0000 | ORAL_TABLET | ORAL | Status: DC | PRN

## 2016-06-09 NOTE — Telephone Encounter (Signed)
Patient advised Rx ready for pick up.  Thanks,  -Priscilla Meza 

## 2016-06-16 ENCOUNTER — Other Ambulatory Visit: Payer: Self-pay

## 2016-06-16 ENCOUNTER — Encounter: Payer: Self-pay | Admitting: Physician Assistant

## 2016-06-16 DIAGNOSIS — K219 Gastro-esophageal reflux disease without esophagitis: Secondary | ICD-10-CM

## 2016-06-16 MED ORDER — PANTOPRAZOLE SODIUM 40 MG PO TBEC: 40.0000 mg | DELAYED_RELEASE_TABLET | Freq: Every day | ORAL | Status: DC

## 2016-06-16 NOTE — Telephone Encounter (Signed)
hey [name] this message came from [place]. Hi [name], Could you please send a [facility] to [facility] [address] ([id number]) to refill my PANTOPRAZOLE SOD DR 40 MG TAB? They said they can't fax you a request since you were not the original prescriber. I take one a day.  Thank you, [name]

## 2016-07-08 ENCOUNTER — Other Ambulatory Visit: Payer: Self-pay | Admitting: Physician Assistant

## 2016-07-08 DIAGNOSIS — G8929 Other chronic pain: Secondary | ICD-10-CM

## 2016-07-08 DIAGNOSIS — M545 Low back pain, unspecified: Secondary | ICD-10-CM

## 2016-07-08 MED ORDER — HYDROCODONE-ACETAMINOPHEN 5-325 MG PO TABS: 1.0000 | ORAL_TABLET | ORAL | 0 refills | Status: DC | PRN

## 2016-07-10 ENCOUNTER — Encounter: Payer: Self-pay | Admitting: Gastroenterology

## 2016-07-27 ENCOUNTER — Other Ambulatory Visit: Payer: Self-pay | Admitting: Physician Assistant

## 2016-07-27 DIAGNOSIS — I10 Essential (primary) hypertension: Secondary | ICD-10-CM

## 2016-07-29 NOTE — Telephone Encounter (Signed)
jennifers patient, please review-aa 

## 2016-08-06 ENCOUNTER — Other Ambulatory Visit: Payer: Self-pay | Admitting: Physician Assistant

## 2016-08-06 DIAGNOSIS — M545 Low back pain: Principal | ICD-10-CM

## 2016-08-06 DIAGNOSIS — G8929 Other chronic pain: Secondary | ICD-10-CM

## 2016-08-06 MED ORDER — HYDROCODONE-ACETAMINOPHEN 5-325 MG PO TABS: 1.0000 | ORAL_TABLET | ORAL | 0 refills | Status: DC | PRN

## 2016-08-06 NOTE — Telephone Encounter (Signed)
Patient is requesting refill

## 2016-08-06 NOTE — Telephone Encounter (Signed)
LM that prescription is up front ready for pick up and also that she is due for CPE.  Thanks,  -Tanuj Mullens

## 2016-08-28 ENCOUNTER — Other Ambulatory Visit: Payer: Self-pay | Admitting: Family Medicine

## 2016-08-28 DIAGNOSIS — I10 Essential (primary) hypertension: Secondary | ICD-10-CM

## 2016-08-28 NOTE — Telephone Encounter (Signed)
See refill request.

## 2016-09-03 ENCOUNTER — Other Ambulatory Visit: Payer: Self-pay | Admitting: Physician Assistant

## 2016-09-03 DIAGNOSIS — M545 Low back pain, unspecified: Secondary | ICD-10-CM

## 2016-09-03 DIAGNOSIS — G8929 Other chronic pain: Secondary | ICD-10-CM

## 2016-09-03 MED ORDER — HYDROCODONE-ACETAMINOPHEN 5-325 MG PO TABS: 1.0000 | ORAL_TABLET | ORAL | 0 refills | Status: DC | PRN

## 2016-09-21 ENCOUNTER — Ambulatory Visit (INDEPENDENT_AMBULATORY_CARE_PROVIDER_SITE_OTHER): Payer: Managed Care, Other (non HMO) | Admitting: Physician Assistant

## 2016-09-21 ENCOUNTER — Encounter: Payer: Self-pay | Admitting: Physician Assistant

## 2016-09-21 VITALS — BP 120/70 | HR 82 | Temp 98.0°F | Resp 16 | Ht 61.0 in | Wt 160.4 lb

## 2016-09-21 DIAGNOSIS — Z833 Family history of diabetes mellitus: Secondary | ICD-10-CM

## 2016-09-21 DIAGNOSIS — Z1159 Encounter for screening for other viral diseases: Secondary | ICD-10-CM | POA: Diagnosis not present

## 2016-09-21 DIAGNOSIS — I159 Secondary hypertension, unspecified: Secondary | ICD-10-CM

## 2016-09-21 DIAGNOSIS — Z0000 Encounter for general adult medical examination without abnormal findings: Secondary | ICD-10-CM

## 2016-09-21 DIAGNOSIS — E7800 Pure hypercholesterolemia, unspecified: Secondary | ICD-10-CM | POA: Diagnosis not present

## 2016-09-21 NOTE — Progress Notes (Signed)
Patient: Priscilla PostinJane C Meza, Female    DOB: 05-23-1956, 60 y.o.   MRN: [id number] Visit Date: 09/21/2016  Today's Provider: Margaretann LovelessJennifer M Azara Gemme, PA-C   Chief Complaint  Patient presents with  . Annual Exam   Subjective:    Annual physical exam Priscilla Meza is a 60 y.o. female who presents today for health maintenance and complete physical. She feels well. She reports not exercising. She reports she is sleeping well.  Per patient she got her Influenza Vaccine [facility]. CPE-05/30/2015 Mammogram:02/19/2016 BI-RADS 1 Colon-09/20/2006-Polyps; Had 05/22/14 [facility] and to repeat in 5 years. EGD: Had UGI bleed in 12/2015 and had EGD with Dr. Shelle Ironein -----------------------------------------------------------------   Review of Systems  Constitutional: Negative.   HENT: Negative.   Eyes: Negative.   Respiratory: Negative.   Cardiovascular: Negative.   Gastrointestinal: Negative.   Endocrine: Negative.   Genitourinary: Positive for enuresis.  Musculoskeletal: Positive for arthralgias and back pain.  Skin: Negative.   Allergic/Immunologic: Negative.   Neurological: Negative.   Hematological: Negative.   Psychiatric/Behavioral: Negative.     Social History      She  reports that she has quit smoking. She has never used smokeless tobacco. She reports that she does not drink alcohol or use drugs.       Social History   Social History  . Marital status: Widowed    Spouse name: N/A  . Number of children: N/A  . Years of education: N/A   Social History Main Topics  . Smoking status: Former Games developermoker  . Smokeless tobacco: Never Used     Comment: quit in 12/2013  . Alcohol use No  . Drug use: No  . Sexual activity: Not Asked   Other Topics Concern  . None   Social History Narrative  . None    Past Medical History:  Diagnosis Date  . Frequent headaches   . GERD (gastroesophageal reflux disease)   . Hypertension   . Urine incontinence       Patient Active Problem List   Diagnosis Date Noted  . Anemia 05/30/2015  . Acid reflux 05/30/2015  . Hypertension 05/30/2015  . Hypercholesteremia 05/30/2015  . Urinary incontinence 05/30/2015  . Chronic low back pain 05/30/2015  . DDD (degenerative disc disease), lumbar 05/30/2015  . Systolic murmur 05/30/2015  . Gastrointestinal bleeding, upper 01/05/2015    Past Surgical History:  Procedure Laterality Date  . ABDOMINAL HYSTERECTOMY  1985  . TUBAL LIGATION      Family History        Family Status  Relation Status  . Maternal Aunt         Her family history includes Breast cancer in her maternal aunt.    No Known Allergies  Current Meds  Medication Sig  . atorvastatin (LIPITOR) 20 MG tablet TAKE 1 TABLET BY MOUTH AT BEDTIME  . hydrochlorothiazide (HYDRODIURIL) 25 MG tablet TAKE 1 TABLET BY MOUTH EVERY DAY  . HYDROcodone-acetaminophen (NORCO/VICODIN) 5-325 MG tablet Take 1 tablet by mouth every 4 (four) hours as needed for moderate pain.  Marland Kitchen. lisinopril (PRINIVIL,ZESTRIL) 20 MG tablet TAKE 1 TABLET EVERY DAY  . pantoprazole (PROTONIX) 40 MG tablet Take 1 tablet (40 mg total) by mouth daily.    Patient Care Team: Margaretann LovelessJennifer M Ilyssa Grennan, PA-C as PCP - General (Physician Assistant)     Objective:   Vitals: BP 120/70 (BP Location: Right Arm, Patient Position: Sitting, Cuff Size: Normal)   Pulse 82  Temp 98 F (36.7 C) (Oral)   Resp 16   Ht 5\' 1"  (1.549 m)   Wt 160 lb 6.4 oz (72.8 kg)   BMI 30.31 kg/m    Physical Exam  Constitutional: She is oriented to person, place, and time. She appears well-developed and well-nourished. No distress.  HENT:  Head: Normocephalic and atraumatic.  Right Ear: Tympanic membrane, external ear and ear canal normal.  Left Ear: Tympanic membrane, external ear and ear canal normal.  Nose: Nose normal.  Mouth/Throat: Uvula is midline, oropharynx is clear and moist and mucous membranes are normal. No oropharyngeal exudate.  Eyes:  Conjunctivae and EOM are normal. Pupils are equal, round, and reactive to light. Right eye exhibits no discharge. Left eye exhibits no discharge. No scleral icterus.  Neck: Normal range of motion. Neck supple. No JVD present. Carotid bruit is not present. No tracheal deviation present. No thyromegaly present.  Cardiovascular: Normal rate, regular rhythm and intact distal pulses.  Exam reveals no gallop and no friction rub.   Murmur heard.  Systolic murmur is present with a grade of 2/6  Pulmonary/Chest: Effort normal and breath sounds normal. No respiratory distress. She has no decreased breath sounds. She has no wheezes. She has no rhonchi. She has no rales. She exhibits no tenderness.  Abdominal: Soft. Bowel sounds are normal. She exhibits no distension and no mass. There is no tenderness. There is no rebound and no guarding.  Musculoskeletal: Normal range of motion. She exhibits no edema or tenderness.  Lymphadenopathy:    She has no cervical adenopathy.  Neurological: She is alert and oriented to person, place, and time.  Skin: Skin is warm and dry. No rash noted. She is not diaphoretic.  Psychiatric: She has a normal mood and affect. Her behavior is normal. Judgment and thought content normal.  Vitals reviewed.    Depression Screen PHQ 2/9 Scores 05/30/2015  PHQ - 2 Score 0      Assessment & Plan:     Routine Health Maintenance and Physical Exam  Exercise Activities and Dietary recommendations Goals    None       There is no immunization history on file for this patient.  Health Maintenance  Topic Date Due  . Hepatitis C Screening  [birth year]  . HIV Screening  09/18/1973  . TETANUS/TDAP  09/18/1977  . PAP SMEAR  09/19/1979  . COLONOSCOPY  09/18/2008  . INFLUENZA VACCINE  06/30/2016  . MAMMOGRAM  02/18/2018      Discussed health benefits of physical activity, and encouraged her to engage in regular exercise appropriate for her age and condition.    1. Annual  physical exam Normal physical exam today. Will check labs as below and f/u pending lab results. If labs are stable and WNL she will not need to have these rechecked for one year at her next annual physical exam. She is to call the office in the meantime if she has any acute issue, questions or concerns. - CBC with Differential/Platelet - TSH  2. Hypercholesteremia Stable on Lipitor 20mg . Will check labs as below and f/u pending results. - Comprehensive metabolic panel - Lipid panel  3. Secondary hypertension Stable on HCTZ 25mg  and lisinopril 40mg . Will check labs as below and f/u pending results. - CBC with Differential/Platelet - Comprehensive metabolic panel  4. Family history of diabetes mellitus Will check labs as below and f/u pending results. - Hemoglobin A1c  5. Need for hepatitis C screening test - Hepatitis C antibody  --------------------------------------------------------------------  Mar Daring, PA-C  [facility]

## 2016-09-21 NOTE — Patient Instructions (Signed)

## 2016-09-25 ENCOUNTER — Telehealth: Payer: Self-pay

## 2016-09-25 LAB — LIPID PANEL
CHOL/HDL RATIO: 4.3 ratio (ref 0.0–4.4)
CHOLESTEROL TOTAL: 172 mg/dL (ref 100–199)
HDL: 40 mg/dL (ref >39)
LDL CALC: 92 mg/dL (ref 0–99)
TRIGLYCERIDES: 198 mg/dL — AB (ref 0–149)
VLDL CHOLESTEROL CAL: 40 mg/dL (ref 5–40)

## 2016-09-25 LAB — CBC WITH DIFFERENTIAL/PLATELET
BASOS ABS: 0 10*3/uL (ref 0.0–0.2)
Basos: 0 %
EOS (ABSOLUTE): 0.2 10*3/uL (ref 0.0–0.4)
EOS: 2 %
HEMATOCRIT: 33.2 % — AB (ref 34.0–46.6)
Hemoglobin: 11.8 g/dL (ref 11.1–15.9)
IMMATURE GRANULOCYTES: 0 %
Immature Grans (Abs): 0 10*3/uL (ref 0.0–0.1)
Lymphocytes Absolute: 2.9 10*3/uL (ref 0.7–3.1)
Lymphs: 33 %
MCH: 30.5 pg (ref 26.6–33.0)
MCHC: 35.5 g/dL (ref 31.5–35.7)
MCV: 86 fL (ref 79–97)
MONOCYTES: 7 %
MONOS ABS: 0.6 10*3/uL (ref 0.1–0.9)
NEUTROS PCT: 58 %
Neutrophils Absolute: 5.2 10*3/uL (ref 1.4–7.0)
Platelets: 339 10*3/uL (ref 150–379)
RBC: 3.87 x10E6/uL (ref 3.77–5.28)
RDW: 12.6 % (ref 12.3–15.4)
WBC: 9 10*3/uL (ref 3.4–10.8)

## 2016-09-25 LAB — COMPREHENSIVE METABOLIC PANEL
ALK PHOS: 77 IU/L (ref 39–117)
ALT: 10 IU/L (ref 0–32)
AST: 10 IU/L (ref 0–40)
Albumin/Globulin Ratio: 1.6 (ref 1.2–2.2)
Albumin: 4.7 g/dL (ref 3.5–5.5)
BUN/Creatinine Ratio: 24 — ABNORMAL HIGH (ref 9–23)
BUN: 25 mg/dL — ABNORMAL HIGH (ref 6–24)
Bilirubin Total: 0.5 mg/dL (ref 0.0–1.2)
CALCIUM: 10.1 mg/dL (ref 8.7–10.2)
CO2: 22 mmol/L (ref 18–29)
CREATININE: 1.03 mg/dL — AB (ref 0.57–1.00)
Chloride: 98 mmol/L (ref 96–106)
GFR calc Af Amer: 69 mL/min/{1.73_m2} (ref >59)
GFR, EST NON AFRICAN AMERICAN: 60 mL/min/{1.73_m2} (ref >59)
GLOBULIN, TOTAL: 3 g/dL (ref 1.5–4.5)
GLUCOSE: 99 mg/dL (ref 65–99)
Potassium: 5 mmol/L (ref 3.5–5.2)
SODIUM: 139 mmol/L (ref 134–144)
Total Protein: 7.7 g/dL (ref 6.0–8.5)

## 2016-09-25 LAB — HEMOGLOBIN A1C
ESTIMATED AVERAGE GLUCOSE: 114 mg/dL
Hgb A1c MFr Bld: 5.6 % (ref 4.8–5.6)

## 2016-09-25 LAB — HEPATITIS C ANTIBODY

## 2016-09-25 LAB — TSH: TSH: 1.01 u[IU]/mL (ref 0.450–4.500)

## 2016-09-25 NOTE — Telephone Encounter (Signed)
-----   Message from Margaretann LovelessJennifer M Burnette, [place]PA-C sent at 09/25/2016  1:07 PM EDT ----- All labs are within normal limits and/or stable.  Thanks! -JB

## 2016-09-25 NOTE — Telephone Encounter (Signed)
Patient advised as directed below.  Thanks,  -Joseline 

## 2016-10-01 ENCOUNTER — Other Ambulatory Visit: Payer: Self-pay | Admitting: Physician Assistant

## 2016-10-01 DIAGNOSIS — M545 Low back pain, unspecified: Secondary | ICD-10-CM

## 2016-10-01 DIAGNOSIS — G8929 Other chronic pain: Secondary | ICD-10-CM

## 2016-10-01 MED ORDER — HYDROCODONE-ACETAMINOPHEN 5-325 MG PO TABS: 1.0000 | ORAL_TABLET | ORAL | 0 refills | Status: DC | PRN

## 2016-10-01 NOTE — Telephone Encounter (Signed)
Rx printed and ready for pick up.  

## 2016-10-28 ENCOUNTER — Other Ambulatory Visit: Payer: Self-pay | Admitting: Physician Assistant

## 2016-10-28 DIAGNOSIS — I10 Essential (primary) hypertension: Secondary | ICD-10-CM

## 2016-10-28 DIAGNOSIS — M545 Low back pain: Principal | ICD-10-CM

## 2016-10-28 DIAGNOSIS — G8929 Other chronic pain: Secondary | ICD-10-CM

## 2016-10-28 MED ORDER — HYDROCODONE-ACETAMINOPHEN 5-325 MG PO TABS: 1.0000 | ORAL_TABLET | ORAL | 0 refills | Status: DC | PRN

## 2016-10-28 NOTE — Telephone Encounter (Signed)
Rx printed

## 2016-11-05 ENCOUNTER — Other Ambulatory Visit: Payer: Self-pay | Admitting: Physician Assistant

## 2016-11-05 DIAGNOSIS — E7800 Pure hypercholesterolemia, unspecified: Secondary | ICD-10-CM

## 2016-11-18 ENCOUNTER — Telehealth: Payer: Self-pay

## 2016-11-18 ENCOUNTER — Other Ambulatory Visit: Payer: Self-pay | Admitting: Physician Assistant

## 2016-11-18 DIAGNOSIS — G8929 Other chronic pain: Secondary | ICD-10-CM

## 2016-11-18 DIAGNOSIS — M545 Low back pain: Principal | ICD-10-CM

## 2016-11-18 MED ORDER — HYDROCODONE-ACETAMINOPHEN 5-325 MG PO TABS: 1.0000 | ORAL_TABLET | ORAL | 0 refills | Status: DC | PRN

## 2016-11-18 NOTE — Telephone Encounter (Signed)
[facility].   HYDROcodone-acetaminophen (NORCO/VICODIN) 5-325 MG tablet  Qty:150 R:0

## 2016-12-15 ENCOUNTER — Other Ambulatory Visit: Payer: Self-pay | Admitting: Physician Assistant

## 2016-12-15 DIAGNOSIS — M545 Low back pain: Principal | ICD-10-CM

## 2016-12-15 DIAGNOSIS — G8929 Other chronic pain: Secondary | ICD-10-CM

## 2016-12-15 MED ORDER — HYDROCODONE-ACETAMINOPHEN 5-325 MG PO TABS: 1.0000 | ORAL_TABLET | ORAL | 0 refills | Status: DC | PRN

## 2016-12-15 NOTE — Telephone Encounter (Signed)
Patient pick up prescription.  Thanks,  -Ivy Meriwether 

## 2016-12-15 NOTE — Telephone Encounter (Signed)
Pt called to check to see if her rx was ready for pick up.  It was not, I spoke to Joseline and she said she would speak to [place] and call the patient back,.  Thanks. teri

## 2017-01-12 ENCOUNTER — Other Ambulatory Visit: Payer: Self-pay | Admitting: Physician Assistant

## 2017-01-12 DIAGNOSIS — G8929 Other chronic pain: Secondary | ICD-10-CM

## 2017-01-12 DIAGNOSIS — M545 Low back pain: Principal | ICD-10-CM

## 2017-01-12 MED ORDER — HYDROCODONE-ACETAMINOPHEN 5-325 MG PO TABS: 1.0000 | ORAL_TABLET | ORAL | 0 refills | Status: DC | PRN

## 2017-01-12 NOTE — Telephone Encounter (Signed)
Patient advised that prescription is placed up front.

## 2017-01-12 NOTE — Telephone Encounter (Signed)
Vicodin Rx printed

## 2017-02-01 ENCOUNTER — Other Ambulatory Visit: Payer: Self-pay | Admitting: Physician Assistant

## 2017-02-01 DIAGNOSIS — Z1231 Encounter for screening mammogram for malignant neoplasm of breast: Secondary | ICD-10-CM

## 2017-02-05 ENCOUNTER — Other Ambulatory Visit: Payer: Self-pay | Admitting: Physician Assistant

## 2017-02-05 DIAGNOSIS — G8929 Other chronic pain: Secondary | ICD-10-CM

## 2017-02-05 DIAGNOSIS — M545 Low back pain, unspecified: Secondary | ICD-10-CM

## 2017-02-05 MED ORDER — HYDROCODONE-ACETAMINOPHEN 5-325 MG PO TABS: 1.0000 | ORAL_TABLET | ORAL | 0 refills | Status: DC | PRN

## 2017-02-05 NOTE — Telephone Encounter (Signed)
Rx printed

## 2017-02-05 NOTE — Telephone Encounter (Signed)
Pt picked up rx

## 2017-02-20 ENCOUNTER — Other Ambulatory Visit: Payer: Self-pay | Admitting: Physician Assistant

## 2017-02-20 DIAGNOSIS — I10 Essential (primary) hypertension: Secondary | ICD-10-CM

## 2017-03-01 ENCOUNTER — Ambulatory Visit
Admission: RE | Admit: 2017-03-01 | Discharge: 2017-03-01 | Disposition: A | Discharge status: Home / Self Care | Payer: Managed Care, Other (non HMO) | Source: Ambulatory Visit | Attending: Physician Assistant | Admitting: Physician Assistant

## 2017-03-01 DIAGNOSIS — Z1231 Encounter for screening mammogram for malignant neoplasm of breast: Secondary | ICD-10-CM | POA: Insufficient documentation

## 2017-03-02 ENCOUNTER — Telehealth: Payer: Self-pay

## 2017-03-02 NOTE — Telephone Encounter (Signed)
LMTCB  Thanks,  -Lanaysia Fritchman 

## 2017-03-02 NOTE — Telephone Encounter (Signed)
Patient called back to let me know she saw the result on her MyChart.  Thanks,  -Partick Musselman

## 2017-03-02 NOTE — Telephone Encounter (Signed)
-----   Message from Margaretann Loveless, PA-C sent at 03/02/2017 10:15 AM EDT ----- Normal mammogram. Repeat screening in one year.

## 2017-03-04 ENCOUNTER — Other Ambulatory Visit: Payer: Self-pay | Admitting: Physician Assistant

## 2017-03-04 DIAGNOSIS — M545 Low back pain: Principal | ICD-10-CM

## 2017-03-04 DIAGNOSIS — G8929 Other chronic pain: Secondary | ICD-10-CM

## 2017-03-05 ENCOUNTER — Other Ambulatory Visit: Payer: Self-pay

## 2017-03-05 MED ORDER — HYDROCODONE-ACETAMINOPHEN 5-325 MG PO TABS: 1.0000 | ORAL_TABLET | ORAL | 0 refills | Status: DC | PRN

## 2017-03-05 NOTE — Telephone Encounter (Signed)
Refilled. [place] Substance abuse registry checked for patient.

## 2017-03-05 NOTE — Telephone Encounter (Signed)
Pt contacted office for refill request on the following medications:  HYDROcodone-acetaminophen (NORCO/VICODIN) 5-325 MG tablet.  CB#[phone]/MW   Pt would like to pick this up today at lunch/MW

## 2017-03-31 ENCOUNTER — Other Ambulatory Visit: Payer: Self-pay | Admitting: Physician Assistant

## 2017-03-31 ENCOUNTER — Encounter (INDEPENDENT_AMBULATORY_CARE_PROVIDER_SITE_OTHER): Payer: Self-pay

## 2017-03-31 DIAGNOSIS — M545 Low back pain, unspecified: Secondary | ICD-10-CM

## 2017-03-31 DIAGNOSIS — G8929 Other chronic pain: Secondary | ICD-10-CM

## 2017-03-31 MED ORDER — HYDROCODONE-ACETAMINOPHEN 5-325 MG PO TABS: 1.0000 | ORAL_TABLET | ORAL | 0 refills | Status: DC | PRN

## 2017-03-31 NOTE — Telephone Encounter (Signed)
Rx printed and will be up front for pick up. [place] Substance registry checked and no red flags noted.

## 2017-04-28 ENCOUNTER — Other Ambulatory Visit: Payer: Self-pay | Admitting: Physician Assistant

## 2017-04-28 DIAGNOSIS — G8929 Other chronic pain: Secondary | ICD-10-CM

## 2017-04-28 DIAGNOSIS — M545 Low back pain: Principal | ICD-10-CM

## 2017-04-28 MED ORDER — HYDROCODONE-ACETAMINOPHEN 5-325 MG PO TABS: 1.0000 | ORAL_TABLET | ORAL | 0 refills | Status: DC | PRN

## 2017-04-28 NOTE — Telephone Encounter (Signed)
Rx printed. NCSR reviewed and no red flags noted. 

## 2017-05-26 ENCOUNTER — Other Ambulatory Visit: Payer: Self-pay | Admitting: Physician Assistant

## 2017-05-26 DIAGNOSIS — M545 Low back pain, unspecified: Secondary | ICD-10-CM

## 2017-05-26 DIAGNOSIS — G8929 Other chronic pain: Secondary | ICD-10-CM

## 2017-05-26 MED ORDER — HYDROCODONE-ACETAMINOPHEN 5-325 MG PO TABS: 1.0000 | ORAL_TABLET | ORAL | 0 refills | Status: DC | PRN

## 2017-05-26 NOTE — Telephone Encounter (Signed)
Rx printed and available for pick up 

## 2017-06-21 ENCOUNTER — Other Ambulatory Visit: Payer: Self-pay | Admitting: Physician Assistant

## 2017-06-21 DIAGNOSIS — M545 Low back pain, unspecified: Secondary | ICD-10-CM

## 2017-06-21 DIAGNOSIS — G8929 Other chronic pain: Secondary | ICD-10-CM

## 2017-06-22 ENCOUNTER — Other Ambulatory Visit: Payer: Self-pay | Admitting: Physician Assistant

## 2017-06-22 DIAGNOSIS — K219 Gastro-esophageal reflux disease without esophagitis: Secondary | ICD-10-CM

## 2017-06-22 MED ORDER — HYDROCODONE-ACETAMINOPHEN 5-325 MG PO TABS: 1.0000 | ORAL_TABLET | ORAL | 0 refills | Status: DC | PRN

## 2017-06-22 NOTE — Telephone Encounter (Signed)
Rx printed. NCCSR reviewed and no red flags noted.  

## 2017-06-22 NOTE — Telephone Encounter (Signed)
Left patient a message advising her that RX is ready for pick up 

## 2017-07-20 ENCOUNTER — Other Ambulatory Visit: Payer: Self-pay | Admitting: Physician Assistant

## 2017-07-20 DIAGNOSIS — M545 Low back pain: Principal | ICD-10-CM

## 2017-07-20 DIAGNOSIS — G8929 Other chronic pain: Secondary | ICD-10-CM

## 2017-07-21 MED ORDER — HYDROCODONE-ACETAMINOPHEN 5-325 MG PO TABS: 1.0000 | ORAL_TABLET | ORAL | 0 refills | Status: DC | PRN

## 2017-07-21 NOTE — Telephone Encounter (Signed)
Rx printed. NCCSR reviewed. 

## 2017-07-21 NOTE — Telephone Encounter (Signed)
LM that prescription is ready for pick up.  Thanks,  -Kearney Evitt 

## 2017-07-23 ENCOUNTER — Other Ambulatory Visit: Payer: Self-pay | Admitting: Physician Assistant

## 2017-07-23 DIAGNOSIS — I10 Essential (primary) hypertension: Secondary | ICD-10-CM

## 2017-08-11 ENCOUNTER — Other Ambulatory Visit: Payer: Self-pay | Admitting: Physician Assistant

## 2017-08-11 DIAGNOSIS — G8929 Other chronic pain: Secondary | ICD-10-CM

## 2017-08-11 DIAGNOSIS — M545 Low back pain: Principal | ICD-10-CM

## 2017-08-11 MED ORDER — HYDROCODONE-ACETAMINOPHEN 5-325 MG PO TABS: 1.0000 | ORAL_TABLET | ORAL | 0 refills | Status: DC | PRN
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
## 2017-08-11 NOTE — Telephone Encounter (Signed)
Patient advised prescription placed up front ready for pick up.  Thanks,  -Zafirah Vanzee 

## 2017-08-20 ENCOUNTER — Other Ambulatory Visit: Payer: Self-pay | Admitting: Physician Assistant

## 2017-08-20 DIAGNOSIS — I10 Essential (primary) hypertension: Secondary | ICD-10-CM

## 2017-09-07 ENCOUNTER — Other Ambulatory Visit: Payer: Self-pay | Admitting: Physician Assistant

## 2017-09-07 DIAGNOSIS — G8929 Other chronic pain: Secondary | ICD-10-CM

## 2017-09-07 DIAGNOSIS — M545 Low back pain: Principal | ICD-10-CM

## 2017-09-08 MED ORDER — HYDROCODONE-ACETAMINOPHEN 5-325 MG PO TABS: 1.0000 | ORAL_TABLET | ORAL | 0 refills | Status: DC | PRN

## 2017-09-08 NOTE — Telephone Encounter (Signed)
NCCSR reviewed. Rx printed. 

## 2017-10-05 ENCOUNTER — Other Ambulatory Visit: Payer: Self-pay | Admitting: Physician Assistant

## 2017-10-05 DIAGNOSIS — M545 Low back pain, unspecified: Secondary | ICD-10-CM

## 2017-10-05 DIAGNOSIS — G8929 Other chronic pain: Secondary | ICD-10-CM

## 2017-10-06 ENCOUNTER — Other Ambulatory Visit: Payer: Self-pay | Admitting: Physician Assistant

## 2017-10-06 DIAGNOSIS — M545 Low back pain, unspecified: Secondary | ICD-10-CM

## 2017-10-06 DIAGNOSIS — G8929 Other chronic pain: Secondary | ICD-10-CM

## 2017-10-06 MED ORDER — HYDROCODONE-ACETAMINOPHEN 5-325 MG PO TABS: 1.0000 | ORAL_TABLET | ORAL | 0 refills | Status: DC | PRN

## 2017-10-06 NOTE — Telephone Encounter (Signed)
NCCSR reviewed. Rx printed. 

## 2017-10-23 ENCOUNTER — Encounter: Payer: Self-pay | Admitting: Physician Assistant

## 2017-10-23 DIAGNOSIS — K047 Periapical abscess without sinus: Secondary | ICD-10-CM

## 2017-10-25 ENCOUNTER — Other Ambulatory Visit: Payer: Self-pay | Admitting: Physician Assistant

## 2017-10-25 DIAGNOSIS — K047 Periapical abscess without sinus: Secondary | ICD-10-CM

## 2017-10-25 DIAGNOSIS — E7800 Pure hypercholesterolemia, unspecified: Secondary | ICD-10-CM

## 2017-10-25 MED ORDER — CLINDAMYCIN HCL 150 MG PO CAPS: 150.0000 mg | ORAL_CAPSULE | Freq: Three times a day (TID) | ORAL | 0 refills | Status: DC

## 2017-11-02 ENCOUNTER — Other Ambulatory Visit: Payer: Self-pay | Admitting: Physician Assistant

## 2017-11-02 DIAGNOSIS — G8929 Other chronic pain: Secondary | ICD-10-CM

## 2017-11-02 DIAGNOSIS — M545 Low back pain: Principal | ICD-10-CM

## 2017-11-03 MED ORDER — HYDROCODONE-ACETAMINOPHEN 5-325 MG PO TABS: 1.0000 | ORAL_TABLET | ORAL | 0 refills | Status: DC | PRN

## 2017-11-03 NOTE — Telephone Encounter (Signed)
NCCSR reviewed and Rx printed. 

## 2017-11-25 ENCOUNTER — Other Ambulatory Visit: Payer: Self-pay | Admitting: Physician Assistant

## 2017-11-25 DIAGNOSIS — M545 Low back pain: Principal | ICD-10-CM

## 2017-11-25 DIAGNOSIS — G8929 Other chronic pain: Secondary | ICD-10-CM

## 2017-11-25 MED ORDER — HYDROCODONE-ACETAMINOPHEN 5-325 MG PO TABS: 1.0000 | ORAL_TABLET | ORAL | 0 refills | Status: DC | PRN

## 2017-11-25 NOTE — Telephone Encounter (Signed)
NCCSR reviewed. Rx printed.   Patient needs to schedule CPE before more refills since she has not been seen since 08/2016

## 2017-11-25 NOTE — Telephone Encounter (Signed)
NCCSR reviewed. Rx printed.  Patient needs to schedule CPE before more refills as she has not been seen

## 2017-11-25 NOTE — Telephone Encounter (Signed)
Patient advised prescription is ready for pick up.  Thanks,  -Bernisha Verma

## 2017-12-06 ENCOUNTER — Ambulatory Visit (INDEPENDENT_AMBULATORY_CARE_PROVIDER_SITE_OTHER): Payer: Managed Care, Other (non HMO) | Admitting: Physician Assistant

## 2017-12-06 ENCOUNTER — Encounter: Payer: Self-pay | Admitting: Physician Assistant

## 2017-12-06 VITALS — BP 130/70 | HR 84 | Temp 98.1°F | Resp 16 | Ht 61.0 in | Wt 156.8 lb

## 2017-12-06 DIAGNOSIS — Z833 Family history of diabetes mellitus: Secondary | ICD-10-CM | POA: Diagnosis not present

## 2017-12-06 DIAGNOSIS — E7800 Pure hypercholesterolemia, unspecified: Secondary | ICD-10-CM | POA: Diagnosis not present

## 2017-12-06 DIAGNOSIS — Z1211 Encounter for screening for malignant neoplasm of colon: Secondary | ICD-10-CM

## 2017-12-06 DIAGNOSIS — Z0000 Encounter for general adult medical examination without abnormal findings: Secondary | ICD-10-CM | POA: Diagnosis not present

## 2017-12-06 DIAGNOSIS — Z1231 Encounter for screening mammogram for malignant neoplasm of breast: Secondary | ICD-10-CM | POA: Diagnosis not present

## 2017-12-06 DIAGNOSIS — I159 Secondary hypertension, unspecified: Secondary | ICD-10-CM

## 2017-12-06 DIAGNOSIS — Z1239 Encounter for other screening for malignant neoplasm of breast: Secondary | ICD-10-CM

## 2017-12-06 NOTE — Progress Notes (Signed)
Patient: Priscilla Meza, Female    DOB: [birth year], 62 y.o.   MRN: [id number] Visit Date: 12/06/2017  Today's Provider: Margaretann Loveless, PA-C   Chief Complaint  Patient presents with  . Annual Exam   Subjective:    Annual physical exam Priscilla Meza is a 62 y.o. female who presents today for health maintenance and complete physical. She feels well. She reports exercising none. She reports she is sleeping well.  Last CPE:09/21/16 Mammogram:03/02/17 BI-RADS 1 Colonoscopy:05/22/14 Polyps, Recheck in 5 yrs Flu Vaccine at [facility] 08/26/2017 -----------------------------------------------------------------   Review of Systems  Constitutional: Negative.   HENT: Negative.   Eyes: Negative.   Respiratory: Negative.   Cardiovascular: Negative.   Gastrointestinal: Negative.   Endocrine: Negative.   Genitourinary: Negative.   Musculoskeletal: Negative.   Skin: Negative.   Allergic/Immunologic: Negative.   Neurological: Negative.   Hematological: Negative.   Psychiatric/Behavioral: Negative.     Social History      She  reports that she has quit smoking. she has never used smokeless tobacco. She reports that she does not drink alcohol or use drugs.       Social History   Socioeconomic History  . Marital status: Widowed    Spouse name: None  . Number of children: None  . Years of education: None  . Highest education level: None  Social Needs  . Financial resource strain: None  . Food insecurity - worry: None  . Food insecurity - inability: None  . Transportation needs - medical: None  . Transportation needs - non-medical: None  Occupational History  . None  Tobacco Use  . Smoking status: Former Games developer  . Smokeless tobacco: Never Used  . Tobacco comment: quit in 12/2013  Substance and Sexual Activity  . Alcohol use: No    Alcohol/week: 0.0 oz  . Drug use: No  . Sexual activity: None  Other Topics Concern  . None  Social History Narrative  . None     Past Medical History:  Diagnosis Date  . Frequent headaches   . GERD (gastroesophageal reflux disease)   . Hypertension   . Urine incontinence      Patient Active Problem List   Diagnosis Date Noted  . Anemia 05/30/2015  . Acid reflux 05/30/2015  . Hypertension 05/30/2015  . Hypercholesteremia 05/30/2015  . Urinary incontinence 05/30/2015  . Chronic low back pain 05/30/2015  . DDD (degenerative disc disease), lumbar 05/30/2015  . Systolic murmur 05/30/2015  . Gastrointestinal bleeding, upper 01/05/2015    Past Surgical History:  Procedure Laterality Date  . ABDOMINAL HYSTERECTOMY  1985  . TUBAL LIGATION      Family History        Family Status  Relation Name Status  . Mat Aunt  (Not Specified)        Her family history includes Breast cancer in her maternal aunt.     No Known Allergies   Current Outpatient Medications:  .  atorvastatin (LIPITOR) 20 MG tablet, TAKE 1 TABLET BY MOUTH AT BEDTIME, Disp: 90 tablet, Rfl: 3 .  hydrochlorothiazide (HYDRODIURIL) 25 MG tablet, TAKE 1 TABLET BY MOUTH EVERY DAY, Disp: 90 tablet, Rfl: 1 .  HYDROcodone-acetaminophen (NORCO/VICODIN) 5-325 MG tablet, Take 1 tablet by mouth every 4 (four) hours as needed for moderate pain., Disp: 150 tablet, Rfl: 0 .  lisinopril (PRINIVIL,ZESTRIL) 20 MG tablet, TAKE 1 TABLET EVERY DAY, Disp: 90 tablet, Rfl: 3 .  pantoprazole (PROTONIX) 40 MG tablet, TAKE  1 TABLET (40 MG TOTAL) BY MOUTH DAILY., Disp: 30 tablet, Rfl: 11 .  clindamycin (CLEOCIN) 150 MG capsule, TAKE 1 CAPSULE (150 MG TOTAL) BY MOUTH 3 (THREE) TIMES DAILY. (Patient not taking: Reported on 12/06/2017), Disp: 21 capsule, Rfl: 0   Patient Care Team: Margaretann LovelessBurnette, Jennifer M, PA-C as PCP - General (Physician Assistant)      Objective:   Vitals: BP 130/70 (BP Location: Left Arm, Patient Position: Sitting, Cuff Size: Normal)   Pulse 84   Temp 98.1 F (36.7 C) (Oral)   Resp 16   Ht 5\' 1"  (1.549 m)   Wt 156 lb 12.8 oz (71.1 kg)   BMI  29.63 kg/m    Physical Exam  Constitutional: She is oriented to person, place, and time. She appears well-developed and well-nourished. No distress.  HENT:  Head: Normocephalic and atraumatic.  Right Ear: Hearing, tympanic membrane, external ear and ear canal normal.  Left Ear: Hearing, tympanic membrane, external ear and ear canal normal.  Nose: Nose normal.  Mouth/Throat: Uvula is midline, oropharynx is clear and moist and mucous membranes are normal. No oropharyngeal exudate.  Eyes: Conjunctivae and EOM are normal. Pupils are equal, round, and reactive to light. Right eye exhibits no discharge. Left eye exhibits no discharge. No scleral icterus.  Neck: Normal range of motion. Neck supple. No JVD present. Carotid bruit is not present. No tracheal deviation present. No thyromegaly present.  Cardiovascular: Normal rate, regular rhythm and intact distal pulses. Exam reveals no gallop and no friction rub.  Murmur heard.  Systolic murmur is present with a grade of 2/6. Pulmonary/Chest: Effort normal and breath sounds normal. No respiratory distress. She has no wheezes. She has no rales. She exhibits no tenderness.  Patient deferred breast exam  Abdominal: Soft. Bowel sounds are normal. She exhibits no distension and no mass. There is no tenderness. There is no rebound and no guarding.  Genitourinary:  Genitourinary Comments: S/p hysterectomy  Musculoskeletal: Normal range of motion. She exhibits no edema or tenderness.  Lymphadenopathy:    She has no cervical adenopathy.  Neurological: She is alert and oriented to person, place, and time. She has normal reflexes.  Skin: Skin is warm and dry. No rash noted. She is not diaphoretic.  Psychiatric: She has a normal mood and affect. Her behavior is normal. Judgment and thought content normal.  Vitals reviewed.    Depression Screen PHQ 2/9 Scores 05/30/2015  PHQ - 2 Score 0      Assessment & Plan:     Routine Health Maintenance and  Physical Exam  Exercise Activities and Dietary recommendations Goals    None       There is no immunization history on file for this patient.  Health Maintenance  Topic Date Due  . TETANUS/TDAP  09/18/1977  . MAMMOGRAM  03/02/2019  . COLONOSCOPY  05/23/2019  . INFLUENZA VACCINE  Completed  . Hepatitis C Screening  Completed  . PAP SMEAR  Discontinued  . HIV Screening  Discontinued     Discussed health benefits of physical activity, and encouraged her to engage in regular exercise appropriate for her age and condition.    1. Annual physical exam Normal physical exam today. Will check labs as below and f/u pending lab results. If labs are stable and WNL she will not need to have these rechecked for one year at her next annual physical exam. She is to call the office in the meantime if she has any acute issue, questions or concerns. -  CBC with Differential/Platelet - Comprehensive metabolic panel - TSH  2. Breast cancer screening There is no family history of breast cancer. She does perform regular self breast exams. Mammogram was ordered as below. Information for [facility] was given to patient so she may schedule her mammogram at her convenience. - MM DIGITAL SCREENING BILATERAL; Future  3. Colon cancer screening Family history of colon cancer in grandmother. Due for repeat colonoscopy next year. Patient aware and in agreement.   4. Hypercholesteremia Stable on atorvastatin 20mg . Will check labs as below and f/u pending results. - Lipid panel  5. Secondary hypertension Stable on HCTZ 25mg  and lisinopril 20mg . Will check labs as below and f/u pending results. - CBC with Differential/Platelet - Comprehensive metabolic panel - TSH  6. Family history of diabetes mellitus Will check labs as below and f/u pending results. - Hemoglobin A1c  --------------------------------------------------------------------    Margaretann Loveless, PA-C  [facility]

## 2017-12-06 NOTE — Patient Instructions (Signed)

## 2017-12-08 ENCOUNTER — Telehealth: Payer: Self-pay

## 2017-12-08 LAB — CBC WITH DIFFERENTIAL/PLATELET
BASOS: 1 %
Basophils Absolute: 0.1 10*3/uL (ref 0.0–0.2)
EOS (ABSOLUTE): 0.2 10*3/uL (ref 0.0–0.4)
EOS: 2 %
HEMATOCRIT: 36.1 % (ref 34.0–46.6)
HEMOGLOBIN: 11.8 g/dL (ref 11.1–15.9)
IMMATURE GRANS (ABS): 0 10*3/uL (ref 0.0–0.1)
Immature Granulocytes: 0 %
LYMPHS ABS: 3.5 10*3/uL — AB (ref 0.7–3.1)
LYMPHS: 33 %
MCH: 30 pg (ref 26.6–33.0)
MCHC: 32.7 g/dL (ref 31.5–35.7)
MCV: 92 fL (ref 79–97)
MONOCYTES: 7 %
Monocytes Absolute: 0.7 10*3/uL (ref 0.1–0.9)
Neutrophils Absolute: 6 10*3/uL (ref 1.4–7.0)
Neutrophils: 57 %
Platelets: 343 10*3/uL (ref 150–379)
RBC: 3.93 x10E6/uL (ref 3.77–5.28)
RDW: 13.7 % (ref 12.3–15.4)
WBC: 10.6 10*3/uL (ref 3.4–10.8)

## 2017-12-08 LAB — HEMOGLOBIN A1C
Est. average glucose Bld gHb Est-mCnc: 114 mg/dL
HEMOGLOBIN A1C: 5.6 % (ref 4.8–5.6)

## 2017-12-08 LAB — COMPREHENSIVE METABOLIC PANEL
ALBUMIN: 4.8 g/dL (ref 3.5–5.5)
ALK PHOS: 71 IU/L (ref 39–117)
ALT: 12 IU/L (ref 0–32)
AST: 17 IU/L (ref 0–40)
Albumin/Globulin Ratio: 1.6 (ref 1.2–2.2)
BILIRUBIN TOTAL: 0.4 mg/dL (ref 0.0–1.2)
BUN / CREAT RATIO: 15 (ref 9–23)
BUN: 15 mg/dL (ref 6–24)
CO2: 21 mmol/L (ref 20–29)
CREATININE: 1 mg/dL (ref 0.57–1.00)
Calcium: 10 mg/dL (ref 8.7–10.2)
Chloride: 103 mmol/L (ref 96–106)
GFR calc non Af Amer: 62 mL/min/{1.73_m2} (ref >59)
GFR, EST AFRICAN AMERICAN: 71 mL/min/{1.73_m2} (ref >59)
GLOBULIN, TOTAL: 3 g/dL (ref 1.5–4.5)
Glucose: 101 mg/dL — ABNORMAL HIGH (ref 65–99)
Potassium: 4.6 mmol/L (ref 3.5–5.2)
SODIUM: 139 mmol/L (ref 134–144)
TOTAL PROTEIN: 7.8 g/dL (ref 6.0–8.5)

## 2017-12-08 LAB — LIPID PANEL
CHOL/HDL RATIO: 3.9 ratio (ref 0.0–4.4)
Cholesterol, Total: 163 mg/dL (ref 100–199)
HDL: 42 mg/dL (ref >39)
LDL CALC: 78 mg/dL (ref 0–99)
TRIGLYCERIDES: 214 mg/dL — AB (ref 0–149)
VLDL Cholesterol Cal: 43 mg/dL — ABNORMAL HIGH (ref 5–40)

## 2017-12-08 LAB — TSH: TSH: 0.733 u[IU]/mL (ref 0.450–4.500)

## 2017-12-08 NOTE — Telephone Encounter (Signed)
-----   Message from Margaretann LovelessJennifer M Burnette, PA-C sent at 12/08/2017  9:38 AM EST ----- All labs are within normal limits and stable.  Thanks! -JB

## 2017-12-08 NOTE — Telephone Encounter (Signed)
LM that I was calling regarding her labs results and reminder that she can also view the results in her mychart. If any questions or concerns to call.  Thanks,  -Priscilla Meza

## 2017-12-09 NOTE — Telephone Encounter (Signed)
Patient advised as below.  

## 2017-12-22 ENCOUNTER — Other Ambulatory Visit: Payer: Self-pay | Admitting: Physician Assistant

## 2017-12-22 DIAGNOSIS — M545 Low back pain, unspecified: Secondary | ICD-10-CM

## 2017-12-22 DIAGNOSIS — G8929 Other chronic pain: Secondary | ICD-10-CM

## 2017-12-22 MED ORDER — HYDROCODONE-ACETAMINOPHEN 5-325 MG PO TABS: 1.0000 | ORAL_TABLET | ORAL | 0 refills | Status: DC | PRN

## 2017-12-22 NOTE — Telephone Encounter (Signed)
NCCSR reviewed. Rx printed. 

## 2017-12-22 NOTE — Telephone Encounter (Signed)
Patient advised RX is ready for pick up.  

## 2018-01-18 ENCOUNTER — Other Ambulatory Visit: Payer: Self-pay | Admitting: Physician Assistant

## 2018-01-18 DIAGNOSIS — G8929 Other chronic pain: Secondary | ICD-10-CM

## 2018-01-18 DIAGNOSIS — M545 Low back pain: Principal | ICD-10-CM

## 2018-01-19 MED ORDER — HYDROCODONE-ACETAMINOPHEN 5-325 MG PO TABS: 1.0000 | ORAL_TABLET | ORAL | 0 refills | Status: DC | PRN

## 2018-01-19 NOTE — Telephone Encounter (Signed)
NCCSR reviewed. Rx printed. 

## 2018-01-19 NOTE — Telephone Encounter (Signed)
Lm-Prescription is placed up front ready for pick up. Thanks,  -Euell Schiff

## 2018-02-12 ENCOUNTER — Other Ambulatory Visit: Payer: Self-pay | Admitting: Physician Assistant

## 2018-02-12 DIAGNOSIS — I10 Essential (primary) hypertension: Secondary | ICD-10-CM

## 2018-02-14 ENCOUNTER — Other Ambulatory Visit: Payer: Self-pay | Admitting: Physician Assistant

## 2018-02-14 DIAGNOSIS — G8929 Other chronic pain: Secondary | ICD-10-CM

## 2018-02-14 DIAGNOSIS — M545 Low back pain, unspecified: Secondary | ICD-10-CM

## 2018-02-15 ENCOUNTER — Other Ambulatory Visit: Payer: Self-pay | Admitting: Physician Assistant

## 2018-02-15 DIAGNOSIS — M545 Low back pain, unspecified: Secondary | ICD-10-CM

## 2018-02-15 DIAGNOSIS — G8929 Other chronic pain: Secondary | ICD-10-CM

## 2018-02-15 MED ORDER — HYDROCODONE-ACETAMINOPHEN 5-325 MG PO TABS: 1.0000 | ORAL_TABLET | ORAL | 0 refills | Status: DC | PRN

## 2018-02-15 NOTE — Progress Notes (Signed)
Rx sent in

## 2018-03-11 ENCOUNTER — Other Ambulatory Visit: Payer: Self-pay | Admitting: Physician Assistant

## 2018-03-11 DIAGNOSIS — M545 Low back pain: Principal | ICD-10-CM

## 2018-03-11 DIAGNOSIS — G8929 Other chronic pain: Secondary | ICD-10-CM

## 2018-03-11 MED ORDER — HYDROCODONE-ACETAMINOPHEN 5-325 MG PO TABS: 1.0000 | ORAL_TABLET | ORAL | 0 refills | Status: DC | PRN

## 2018-03-11 NOTE — Telephone Encounter (Signed)
NCCSR reviewed. No red flags.

## 2018-04-04 ENCOUNTER — Telehealth: Payer: Self-pay | Admitting: Physician Assistant

## 2018-04-04 DIAGNOSIS — M545 Low back pain: Principal | ICD-10-CM

## 2018-04-04 DIAGNOSIS — G8929 Other chronic pain: Secondary | ICD-10-CM

## 2018-04-04 MED ORDER — HYDROCODONE-ACETAMINOPHEN 5-325 MG PO TABS: 1.0000 | ORAL_TABLET | ORAL | 0 refills | Status: DC | PRN

## 2018-04-04 NOTE — Telephone Encounter (Signed)
Pt contacted office for refill request on the following medications:  HYDROcodone-acetaminophen (NORCO/VICODIN) 5-325 MG tablet  [facility]  Last Rx: 03/11/18 LOV: 12/06/17 Pt stated that she is currently [facility] because she has been helping with her parents medication. [facility] pt's preferred [facility] list. Please advise. Thanks TNP

## 2018-04-04 NOTE — Telephone Encounter (Signed)
NCCSR reviewed. Rx sent in. 

## 2018-04-28 ENCOUNTER — Other Ambulatory Visit: Payer: Self-pay | Admitting: Physician Assistant

## 2018-04-28 DIAGNOSIS — M545 Low back pain, unspecified: Secondary | ICD-10-CM

## 2018-04-28 DIAGNOSIS — G8929 Other chronic pain: Secondary | ICD-10-CM

## 2018-04-28 MED ORDER — HYDROCODONE-ACETAMINOPHEN 5-325 MG PO TABS: 1.0000 | ORAL_TABLET | ORAL | 0 refills | Status: DC | PRN

## 2018-04-28 NOTE — Telephone Encounter (Signed)
NCCSR reviewed. 

## 2018-05-20 ENCOUNTER — Other Ambulatory Visit: Payer: Self-pay | Admitting: Physician Assistant

## 2018-05-20 DIAGNOSIS — M545 Low back pain, unspecified: Secondary | ICD-10-CM

## 2018-05-20 DIAGNOSIS — G8929 Other chronic pain: Secondary | ICD-10-CM

## 2018-05-20 MED ORDER — HYDROCODONE-ACETAMINOPHEN 5-325 MG PO TABS: 1.0000 | ORAL_TABLET | ORAL | 0 refills | Status: DC | PRN

## 2018-05-20 NOTE — Telephone Encounter (Signed)
NCCSR reviewed. 

## 2018-05-27 ENCOUNTER — Telehealth: Payer: Self-pay

## 2018-05-27 ENCOUNTER — Emergency Department
Admission: EM | Admit: 2018-05-27 | Discharge: 2018-05-27 | Disposition: A | Discharge status: Home / Self Care | Payer: Managed Care, Other (non HMO) | Attending: Emergency Medicine | Admitting: Emergency Medicine

## 2018-05-27 ENCOUNTER — Other Ambulatory Visit: Payer: Self-pay

## 2018-05-27 DIAGNOSIS — Z203 Contact with and (suspected) exposure to rabies: Secondary | ICD-10-CM | POA: Diagnosis not present

## 2018-05-27 DIAGNOSIS — Z23 Encounter for immunization: Secondary | ICD-10-CM | POA: Diagnosis not present

## 2018-05-27 DIAGNOSIS — F419 Anxiety disorder, unspecified: Secondary | ICD-10-CM

## 2018-05-27 MED ORDER — RABIES VACCINE, PCEC IM SUSR
1.0000 mL | Freq: Once | INTRAMUSCULAR | Status: AC
  Administered 2018-05-27: 1 mL via INTRAMUSCULAR
  Filled 2018-05-27: qty 1

## 2018-05-27 MED ORDER — HYDROXYZINE HCL 50 MG PO TABS: 50.0000 mg | ORAL_TABLET | Freq: Four times a day (QID) | ORAL | 0 refills | Status: DC | PRN

## 2018-05-27 NOTE — Telephone Encounter (Signed)
Please advise 

## 2018-05-27 NOTE — ED Notes (Signed)
No reaction to medication. Pt ambulatory and in NAD at time of discharge.

## 2018-05-27 NOTE — Telephone Encounter (Signed)
Patient advised as directed below. Per patient she went to the ED and got her first Rabies injection today and is scheduled to complete the series.

## 2018-05-27 NOTE — ED Provider Notes (Signed)
[facility] Emergency Department Provider Note   ____________________________________________   None    (approximate)  I have reviewed the triage vital signs and the nursing notes.   HISTORY  Chief Complaint Rabies Injection    HPI Priscilla Meza is a 62 y.o. female patient presents for pre-exposure prophylactics for rabies secondary to beds in her house.  Patient statesanimal control will not remove bats because they have babies until August 2019.   Past Medical History:  Diagnosis Date  . Frequent headaches   . GERD (gastroesophageal reflux disease)   . Hypertension   . Urine incontinence     Patient Active Problem List   Diagnosis Date Noted  . Anemia 05/30/2015  . Acid reflux 05/30/2015  . Hypertension 05/30/2015  . Hypercholesteremia 05/30/2015  . Urinary incontinence 05/30/2015  . Chronic low back pain 05/30/2015  . DDD (degenerative disc disease), lumbar 05/30/2015  . Systolic murmur 05/30/2015  . Gastrointestinal bleeding, upper 01/05/2015    Past Surgical History:  Procedure Laterality Date  . ABDOMINAL HYSTERECTOMY  1985  . TUBAL LIGATION      Prior to Admission medications   Medication Sig Start Date End Date Taking? Authorizing Provider  atorvastatin (LIPITOR) 20 MG tablet TAKE 1 TABLET BY MOUTH AT BEDTIME 10/25/17   Joycelyn Man M, PA-C  clindamycin (CLEOCIN) 150 MG capsule TAKE 1 CAPSULE (150 MG TOTAL) BY MOUTH 3 (THREE) TIMES DAILY. Patient not taking: Reported on 12/06/2017 10/26/17   Margaretann Loveless, PA-C  hydrochlorothiazide (HYDRODIURIL) 25 MG tablet TAKE 1 TABLET BY MOUTH EVERY DAY 02/14/18   Margaretann Loveless, PA-C  HYDROcodone-acetaminophen (NORCO/VICODIN) 5-325 MG tablet Take 1 tablet by mouth every 4 (four) hours as needed for moderate pain. 05/20/18   Margaretann Loveless, PA-C  hydrOXYzine (ATARAX/VISTARIL) 50 MG tablet Take 1 tablet (50 mg total) by mouth every 6 (six) hours as needed. 05/27/18    Margaretann Loveless, PA-C  lisinopril (PRINIVIL,ZESTRIL) 20 MG tablet TAKE 1 TABLET EVERY DAY 07/23/17   Margaretann Loveless, PA-C  pantoprazole (PROTONIX) 40 MG tablet TAKE 1 TABLET (40 MG TOTAL) BY MOUTH DAILY. 06/22/17   Margaretann Loveless, PA-C    Allergies Patient has no known allergies.  Family History  Problem Relation Age of Onset  . Breast cancer Maternal Aunt     Social History Social History   Tobacco Use  . Smoking status: Former Games developer  . Smokeless tobacco: Never Used  . Tobacco comment: quit in 12/2013  Substance Use Topics  . Alcohol use: No    Alcohol/week: 0.0 oz  . Drug use: No    Review of Systems Constitutional: No fever/chills Eyes: No visual changes. ENT: No sore throat. Cardiovascular: Denies chest pain. Respiratory: Denies shortness of breath. Gastrointestinal: No abdominal pain.  No nausea, no vomiting.  No diarrhea.  No constipation. Genitourinary: Negative for dysuria. Musculoskeletal: Chronic back pain. Skin: Negative for rash. Neurological: Negative for headaches, focal weakness or numbness. Endocrine:Hypertension   ____________________________________________   PHYSICAL EXAM:  VITAL SIGNS: ED Triage Vitals [05/27/18 1319]  Enc Vitals Group     BP (!) 156/65     Pulse Rate 91     Resp 18     Temp 98.6 F (37 C)     Temp Source Oral     SpO2 97 %     Weight 153 lb (69.4 kg)     Height 5\' 1"  (1.549 m)     Head Circumference  Peak Flow      Pain Score 0     Pain Loc      Pain Edu?      Excl. in GC?    Constitutional: Alert and oriented. Well appearing and in no acute distress. Cardiovascular: Normal rate, regular rhythm. Grossly normal heart sounds.  Good peripheral circulation. Respiratory: Normal respiratory effort.  No retractions. Lungs CTAB. Musculoskeletal: No lower extremity tenderness nor edema.  No joint effusions. Neurologic:  Normal speech and language. No gross focal neurologic deficits are appreciated. No  gait instability. Skin:  Skin is warm, dry and intact. No rash noted. Psychiatric: Mood and affect are normal. Speech and behavior are normal.  ____________________________________________   LABS (all labs ordered are listed, but only abnormal results are displayed)  Labs Reviewed - No data to display ____________________________________________  EKG   ____________________________________________  RADIOLOGY  ED MD interpretation:    Official radiology report(s): No results found.  ____________________________________________   PROCEDURES  Procedure(s) performed: None  Procedures  Critical Care performed: No  ____________________________________________   INITIAL IMPRESSION / ASSESSMENT AND PLAN / ED COURSE  As part of my medical decision making, I reviewed the following data within the electronic MEDICAL RECORD NUMBER    Patient presents for preexposure prophylactics rabies secondary to infestation in her house.  Patient given first injection today and a schedule to complete the series.      ____________________________________________   FINAL CLINICAL IMPRESSION(S) / ED DIAGNOSES  Final diagnoses:  Rabies, need for prophylactic vaccination against     ED Discharge Orders    None       Note:  This document was prepared using Dragon voice recognition software and may include unintentional dictation errors.    Joni ReiningSmith, Ronald K, PA-C 05/27/18 1344    Sharyn CreamerQuale, Mark, MD 05/27/18 1704

## 2018-05-27 NOTE — Discharge Instructions (Addendum)
Returned on 04 June 2018, 11 June 2018, and 18 June 2017 to complete series of preexposure rabies injections.

## 2018-05-27 NOTE — ED Notes (Signed)
Pt to ED reporting she has bats in her house. Pt does not believe she has been exposed. No symptoms reported. Pt in NAD but requesting the rabies vaccine.

## 2018-05-27 NOTE — Telephone Encounter (Signed)
Patient called saying that she had a bat infestation in her house the last few days. She reports that she has been traumatized by this because she also has a bird-phobia. She will have to move out of her home until they get the bats out.   She is requesting something mild to "calm her nerves" to help her get through this process. [facility] [place] [employer] info is correct. Thanks!

## 2018-05-27 NOTE — ED Triage Notes (Signed)
Pt states she has bats in her home and is needing the rabies injection. States they are not able to remove them until august due to fact that they have babies.

## 2018-05-27 NOTE — Telephone Encounter (Signed)
Vistaril sent in.   Patient also needs to go to health dept for rabies vaccination due to bat exposure if she has not already.

## 2018-05-29 ENCOUNTER — Encounter: Payer: Self-pay | Admitting: Physician Assistant

## 2018-05-29 DIAGNOSIS — F418 Other specified anxiety disorders: Secondary | ICD-10-CM

## 2018-05-30 ENCOUNTER — Ambulatory Visit
Admission: RE | Admit: 2018-05-30 | Discharge: 2018-05-30 | Disposition: A | Discharge status: Home / Self Care | Payer: Managed Care, Other (non HMO) | Source: Ambulatory Visit | Attending: Physician Assistant | Admitting: Physician Assistant

## 2018-05-30 DIAGNOSIS — Z1231 Encounter for screening mammogram for malignant neoplasm of breast: Secondary | ICD-10-CM | POA: Diagnosis present

## 2018-05-30 DIAGNOSIS — Z1239 Encounter for other screening for malignant neoplasm of breast: Secondary | ICD-10-CM

## 2018-05-30 MED ORDER — FLUOXETINE HCL 10 MG PO CAPS: 10.0000 mg | ORAL_CAPSULE | Freq: Every day | ORAL | 3 refills | Status: DC

## 2018-06-01 ENCOUNTER — Telehealth: Payer: Self-pay

## 2018-06-01 NOTE — Telephone Encounter (Signed)
Viewed by Windy CarinaJanie C Crisafulli on 06/01/2018 8:46 AM

## 2018-06-01 NOTE — Telephone Encounter (Signed)
-----   Message from Jennifer M Burnette, PA-C sent at 06/01/2018  8:45 AM EDT ----- Normal mammogram. Repeat screening in one year. 

## 2018-06-04 ENCOUNTER — Other Ambulatory Visit: Payer: Self-pay

## 2018-06-04 ENCOUNTER — Ambulatory Visit
Admission: EM | Admit: 2018-06-04 | Discharge: 2018-06-04 | Disposition: A | Discharge status: Home / Self Care | Payer: Managed Care, Other (non HMO) | Attending: Family Medicine | Admitting: Family Medicine

## 2018-06-04 DIAGNOSIS — Z203 Contact with and (suspected) exposure to rabies: Secondary | ICD-10-CM | POA: Diagnosis not present

## 2018-06-04 DIAGNOSIS — W5589XA Other contact with other mammals, initial encounter: Secondary | ICD-10-CM | POA: Diagnosis not present

## 2018-06-04 DIAGNOSIS — Z23 Encounter for immunization: Secondary | ICD-10-CM

## 2018-06-04 MED ORDER — RABIES VACCINE, PCEC IM SUSR
1.0000 mL | Freq: Once | INTRAMUSCULAR | Status: AC
  Administered 2018-06-04: 1 mL via INTRAMUSCULAR

## 2018-06-04 NOTE — Discharge Instructions (Signed)
Return for 3 vaccine for rabies prophylaxis injection on July 13th, 2019 [facility].

## 2018-06-04 NOTE — ED Triage Notes (Signed)
Patient is here for 2nd rabies vaccine for prophylaxsis. Patient schedule includes injections once weekly x 4 week. Patient instructed to return for additional vaccines on 07/13 and 07/20.

## 2018-06-09 ENCOUNTER — Other Ambulatory Visit: Payer: Self-pay | Admitting: Physician Assistant

## 2018-06-09 DIAGNOSIS — G8929 Other chronic pain: Secondary | ICD-10-CM

## 2018-06-09 DIAGNOSIS — M545 Low back pain, unspecified: Secondary | ICD-10-CM

## 2018-06-09 MED ORDER — HYDROCODONE-ACETAMINOPHEN 5-325 MG PO TABS: 1.0000 | ORAL_TABLET | ORAL | 0 refills | Status: DC | PRN

## 2018-06-09 NOTE — Telephone Encounter (Signed)
NCCSR reviewed. 

## 2018-06-11 ENCOUNTER — Ambulatory Visit
Admission: EM | Admit: 2018-06-11 | Discharge: 2018-06-11 | Disposition: A | Discharge status: Home / Self Care | Payer: Managed Care, Other (non HMO) | Attending: Emergency Medicine | Admitting: Emergency Medicine

## 2018-06-11 DIAGNOSIS — Z23 Encounter for immunization: Secondary | ICD-10-CM | POA: Diagnosis not present

## 2018-06-11 DIAGNOSIS — Z203 Contact with and (suspected) exposure to rabies: Secondary | ICD-10-CM | POA: Diagnosis not present

## 2018-06-11 DIAGNOSIS — W5589XD Other contact with other mammals, subsequent encounter: Secondary | ICD-10-CM | POA: Diagnosis not present

## 2018-06-11 MED ORDER — RABIES VACCINE, PCEC IM SUSR
1.0000 mL | Freq: Once | INTRAMUSCULAR | Status: AC
  Administered 2018-06-11: 1 mL via INTRAMUSCULAR

## 2018-06-11 NOTE — ED Triage Notes (Signed)
Pt presents for her 3rd rabies vaccination for prophylaxis. She states she was advised by the ER that she could have bitten by a bat in her home without realizing it. Therefore, it was decided that she should be vaccinated for rabies after being exposed rather than per-exposure. She will be due for her next vaccine in 7 days, which will be 06/18/2018.

## 2018-06-18 ENCOUNTER — Other Ambulatory Visit: Payer: Self-pay | Admitting: Physician Assistant

## 2018-06-18 ENCOUNTER — Ambulatory Visit
Admission: EM | Admit: 2018-06-18 | Discharge: 2018-06-18 | Disposition: A | Discharge status: Home / Self Care | Payer: Managed Care, Other (non HMO) | Attending: Family Medicine | Admitting: Family Medicine

## 2018-06-18 ENCOUNTER — Encounter: Payer: Self-pay | Admitting: Emergency Medicine

## 2018-06-18 ENCOUNTER — Other Ambulatory Visit: Payer: Self-pay

## 2018-06-18 DIAGNOSIS — Z203 Contact with and (suspected) exposure to rabies: Secondary | ICD-10-CM

## 2018-06-18 DIAGNOSIS — Z23 Encounter for immunization: Secondary | ICD-10-CM | POA: Diagnosis not present

## 2018-06-18 DIAGNOSIS — F419 Anxiety disorder, unspecified: Secondary | ICD-10-CM

## 2018-06-18 DIAGNOSIS — W5589XD Other contact with other mammals, subsequent encounter: Secondary | ICD-10-CM

## 2018-06-18 DIAGNOSIS — K219 Gastro-esophageal reflux disease without esophagitis: Secondary | ICD-10-CM

## 2018-06-18 MED ORDER — RABIES VACCINE, PCEC IM SUSR
1.0000 mL | Freq: Once | INTRAMUSCULAR | Status: AC
  Administered 2018-06-18: 1 mL via INTRAMUSCULAR

## 2018-06-18 NOTE — ED Triage Notes (Signed)
Patient here for her 4th rabies injection prophylactic.

## 2018-06-18 NOTE — ED Triage Notes (Signed)
Patient here for her last dose of rabies vaccine for prophylactic exposure to bats.  Per [facility]'s [facility] ED note and discharge paperwork her 4th rabies (last dose) to be given on 06/18/18.

## 2018-06-21 ENCOUNTER — Other Ambulatory Visit: Payer: Self-pay | Admitting: Physician Assistant

## 2018-06-21 DIAGNOSIS — F418 Other specified anxiety disorders: Secondary | ICD-10-CM

## 2018-06-30 ENCOUNTER — Other Ambulatory Visit: Payer: Self-pay | Admitting: Physician Assistant

## 2018-06-30 DIAGNOSIS — M545 Low back pain, unspecified: Secondary | ICD-10-CM

## 2018-06-30 DIAGNOSIS — G8929 Other chronic pain: Secondary | ICD-10-CM

## 2018-07-01 MED ORDER — HYDROCODONE-ACETAMINOPHEN 5-325 MG PO TABS: 1.0000 | ORAL_TABLET | ORAL | 0 refills | Status: DC | PRN

## 2018-07-01 NOTE — Telephone Encounter (Signed)
NCCSR reviewed. Rx sent in. 

## 2018-07-21 ENCOUNTER — Other Ambulatory Visit: Payer: Self-pay | Admitting: Physician Assistant

## 2018-07-21 DIAGNOSIS — G8929 Other chronic pain: Secondary | ICD-10-CM

## 2018-07-21 DIAGNOSIS — M545 Low back pain, unspecified: Secondary | ICD-10-CM

## 2018-07-21 MED ORDER — HYDROCODONE-ACETAMINOPHEN 5-325 MG PO TABS: 1.0000 | ORAL_TABLET | ORAL | 0 refills | Status: DC | PRN

## 2018-08-11 ENCOUNTER — Other Ambulatory Visit: Payer: Self-pay | Admitting: Physician Assistant

## 2018-08-11 DIAGNOSIS — G8929 Other chronic pain: Secondary | ICD-10-CM

## 2018-08-11 DIAGNOSIS — M545 Low back pain: Principal | ICD-10-CM

## 2018-08-12 ENCOUNTER — Other Ambulatory Visit: Payer: Self-pay | Admitting: Physician Assistant

## 2018-08-12 DIAGNOSIS — M545 Low back pain: Principal | ICD-10-CM

## 2018-08-12 DIAGNOSIS — G8929 Other chronic pain: Secondary | ICD-10-CM

## 2018-08-12 DIAGNOSIS — I10 Essential (primary) hypertension: Secondary | ICD-10-CM

## 2018-08-12 MED ORDER — HYDROCODONE-ACETAMINOPHEN 5-325 MG PO TABS: 1.0000 | ORAL_TABLET | ORAL | 0 refills | Status: DC | PRN

## 2018-08-12 NOTE — Telephone Encounter (Signed)
NCCSR reviewed. 

## 2018-09-01 ENCOUNTER — Other Ambulatory Visit: Payer: Self-pay | Admitting: Physician Assistant

## 2018-09-01 DIAGNOSIS — M545 Low back pain: Principal | ICD-10-CM

## 2018-09-01 DIAGNOSIS — G8929 Other chronic pain: Secondary | ICD-10-CM

## 2018-09-02 MED ORDER — HYDROCODONE-ACETAMINOPHEN 5-325 MG PO TABS: 1.0000 | ORAL_TABLET | ORAL | 0 refills | Status: DC | PRN

## 2018-09-02 NOTE — Telephone Encounter (Signed)
NCCSR reviewed. 

## 2018-09-02 NOTE — Telephone Encounter (Signed)
Please review

## 2018-09-16 ENCOUNTER — Other Ambulatory Visit: Payer: Self-pay | Admitting: Physician Assistant

## 2018-09-16 DIAGNOSIS — K219 Gastro-esophageal reflux disease without esophagitis: Secondary | ICD-10-CM

## 2018-09-26 ENCOUNTER — Other Ambulatory Visit: Payer: Self-pay | Admitting: Physician Assistant

## 2018-09-26 DIAGNOSIS — M545 Low back pain, unspecified: Secondary | ICD-10-CM

## 2018-09-26 DIAGNOSIS — G8929 Other chronic pain: Secondary | ICD-10-CM

## 2018-09-26 NOTE — Telephone Encounter (Signed)
Please review for [name].   Thanks,   -Ancel Easler  

## 2018-09-27 MED ORDER — HYDROCODONE-ACETAMINOPHEN 5-325 MG PO TABS: 1.0000 | ORAL_TABLET | ORAL | 0 refills | Status: DC | PRN

## 2018-09-27 NOTE — Telephone Encounter (Signed)
Pt needing a refill on:  HYDROcodone-acetaminophen (NORCO/VICODIN) 5-325 MG tablet  Please fill at:  [id number] [place], [place] - [address] [phone] (Phone) [phone] (Fax)    Thanks, [employer]

## 2018-09-27 NOTE — Telephone Encounter (Signed)
Pt called requesting refill yesterday and requesting since Antony Contras is out she is requesting someone else fill.

## 2018-09-28 ENCOUNTER — Other Ambulatory Visit: Payer: Self-pay | Admitting: Family Medicine

## 2018-09-28 ENCOUNTER — Other Ambulatory Visit: Payer: Self-pay

## 2018-09-28 DIAGNOSIS — G8929 Other chronic pain: Secondary | ICD-10-CM

## 2018-09-28 DIAGNOSIS — M545 Low back pain, unspecified: Secondary | ICD-10-CM

## 2018-09-28 NOTE — Telephone Encounter (Signed)
She can have 50 tablets until she can get in to see JB. thx

## 2018-09-28 NOTE — Telephone Encounter (Signed)
Patient advised she needed appt. with Antony Contras before she can get any more refills per Dr. Sullivan Lone.  cbe

## 2018-09-28 NOTE — Telephone Encounter (Signed)
Patient needs refill on Hydrocodone-Acet 5-325 mg. [facility] [place].  Patient advised she needs office visit with Antony Contras before any more refills are given per Dr. Sullivan Lone.

## 2018-09-28 NOTE — Telephone Encounter (Signed)
Please review

## 2018-09-29 MED ORDER — HYDROCODONE-ACETAMINOPHEN 5-325 MG PO TABS: 1.0000 | ORAL_TABLET | ORAL | 0 refills | Status: DC | PRN

## 2018-09-29 NOTE — Telephone Encounter (Signed)
Please review for [name].   Thanks,   -Cosandra Plouffe  

## 2018-10-04 ENCOUNTER — Encounter: Payer: Self-pay | Admitting: Physician Assistant

## 2018-10-04 DIAGNOSIS — G8929 Other chronic pain: Secondary | ICD-10-CM

## 2018-10-04 DIAGNOSIS — M545 Low back pain: Principal | ICD-10-CM

## 2018-10-05 MED ORDER — HYDROCODONE-ACETAMINOPHEN 5-325 MG PO TABS: 1.0000 | ORAL_TABLET | ORAL | 0 refills | Status: DC | PRN

## 2018-10-17 ENCOUNTER — Other Ambulatory Visit: Payer: Self-pay | Admitting: Physician Assistant

## 2018-10-17 DIAGNOSIS — I10 Essential (primary) hypertension: Secondary | ICD-10-CM

## 2018-10-24 ENCOUNTER — Other Ambulatory Visit: Payer: Self-pay | Admitting: Physician Assistant

## 2018-10-24 DIAGNOSIS — E7800 Pure hypercholesterolemia, unspecified: Secondary | ICD-10-CM

## 2018-10-25 ENCOUNTER — Other Ambulatory Visit: Payer: Self-pay | Admitting: Physician Assistant

## 2018-10-25 DIAGNOSIS — G8929 Other chronic pain: Secondary | ICD-10-CM

## 2018-10-25 DIAGNOSIS — M545 Low back pain, unspecified: Secondary | ICD-10-CM

## 2018-10-26 MED ORDER — HYDROCODONE-ACETAMINOPHEN 5-325 MG PO TABS: 1.0000 | ORAL_TABLET | ORAL | 0 refills | Status: DC | PRN

## 2018-10-26 NOTE — Telephone Encounter (Signed)
NCCSR reviewed. Patient has appt scheduled for 12/08/18

## 2018-10-26 NOTE — Telephone Encounter (Signed)
She needs to be seen regularly.

## 2018-10-28 ENCOUNTER — Encounter: Payer: Self-pay | Admitting: Physician Assistant

## 2018-10-28 DIAGNOSIS — G8929 Other chronic pain: Secondary | ICD-10-CM

## 2018-10-28 DIAGNOSIS — M545 Low back pain: Principal | ICD-10-CM

## 2018-10-28 MED ORDER — HYDROCODONE-ACETAMINOPHEN 5-325 MG PO TABS: 1.0000 | ORAL_TABLET | ORAL | 0 refills | Status: DC | PRN

## 2018-11-16 ENCOUNTER — Other Ambulatory Visit: Payer: Self-pay | Admitting: Physician Assistant

## 2018-11-16 DIAGNOSIS — G8929 Other chronic pain: Secondary | ICD-10-CM

## 2018-11-16 DIAGNOSIS — M545 Low back pain: Principal | ICD-10-CM

## 2018-11-16 MED ORDER — HYDROCODONE-ACETAMINOPHEN 5-325 MG PO TABS: 1.0000 | ORAL_TABLET | ORAL | 0 refills | Status: DC | PRN

## 2018-11-18 ENCOUNTER — Encounter: Payer: Self-pay | Admitting: Physician Assistant

## 2018-11-18 DIAGNOSIS — M545 Low back pain: Principal | ICD-10-CM

## 2018-11-18 DIAGNOSIS — G8929 Other chronic pain: Secondary | ICD-10-CM

## 2018-11-18 MED ORDER — HYDROCODONE-ACETAMINOPHEN 5-325 MG PO TABS: 1.0000 | ORAL_TABLET | ORAL | 0 refills | Status: DC | PRN

## 2018-12-06 ENCOUNTER — Ambulatory Visit (INDEPENDENT_AMBULATORY_CARE_PROVIDER_SITE_OTHER): Payer: Managed Care, Other (non HMO) | Admitting: Physician Assistant

## 2018-12-06 ENCOUNTER — Encounter: Payer: Self-pay | Admitting: Physician Assistant

## 2018-12-06 VITALS — BP 137/76 | HR 80 | Temp 98.4°F | Resp 16 | Ht 61.0 in | Wt 149.0 lb

## 2018-12-06 DIAGNOSIS — F1190 Opioid use, unspecified, uncomplicated: Secondary | ICD-10-CM

## 2018-12-06 DIAGNOSIS — Z800 Family history of malignant neoplasm of digestive organs: Secondary | ICD-10-CM

## 2018-12-06 DIAGNOSIS — M5136 Other intervertebral disc degeneration, lumbar region: Secondary | ICD-10-CM

## 2018-12-06 DIAGNOSIS — Z0000 Encounter for general adult medical examination without abnormal findings: Secondary | ICD-10-CM

## 2018-12-06 DIAGNOSIS — Z1211 Encounter for screening for malignant neoplasm of colon: Secondary | ICD-10-CM

## 2018-12-06 DIAGNOSIS — I10 Essential (primary) hypertension: Secondary | ICD-10-CM

## 2018-12-06 DIAGNOSIS — Z1239 Encounter for other screening for malignant neoplasm of breast: Secondary | ICD-10-CM

## 2018-12-06 DIAGNOSIS — Z6828 Body mass index (BMI) 28.0-28.9, adult: Secondary | ICD-10-CM

## 2018-12-06 DIAGNOSIS — E7800 Pure hypercholesterolemia, unspecified: Secondary | ICD-10-CM

## 2018-12-06 DIAGNOSIS — D649 Anemia, unspecified: Secondary | ICD-10-CM

## 2018-12-06 NOTE — Progress Notes (Signed)
Patient: Priscilla Meza, Female    DOB: 02/11/1956, 63 y.o.   MRN: [id number] Visit Date: 12/06/2018  Today's Provider: Margaretann LovelessJennifer M , PA-C   Chief Complaint  Patient presents with  . Annual Exam   Subjective:    Annual physical exam Priscilla Meza is a 63 y.o. female who presents today for health maintenance and complete physical. She feels well. She reports exercising none. She reports she is sleeping fairly well. ---------------------------------------------------------------   Review of Systems  Constitutional: Negative.   HENT: Negative.   Eyes: Negative.   Respiratory: Negative.   Cardiovascular: Negative.   Gastrointestinal: Negative.   Endocrine: Negative.   Genitourinary: Positive for frequency.  Musculoskeletal: Positive for arthralgias and back pain.  Skin: Negative.   Allergic/Immunologic: Negative.   Neurological: Negative.   Hematological: Negative.   Psychiatric/Behavioral: Negative.     Social History      She  reports that she has quit smoking. She has never used smokeless tobacco. She reports that she does not drink alcohol or use drugs.       Social History   Socioeconomic History  . Marital status: Widowed    Spouse name: Not on file  . Number of children: Not on file  . Years of education: Not on file  . Highest education level: Not on file  Occupational History  . Not on file  Social Needs  . Financial resource strain: Not on file  . Food insecurity:    Worry: Not on file    Inability: Not on file  . Transportation needs:    Medical: Not on file    Non-medical: Not on file  Tobacco Use  . Smoking status: Former Games developermoker  . Smokeless tobacco: Never Used  . Tobacco comment: quit in 12/2013  Substance and Sexual Activity  . Alcohol use: No    Alcohol/week: 0.0 standard drinks  . Drug use: No  . Sexual activity: Not on file  Lifestyle  . Physical activity:    Days per week: Not on file    Minutes per session: Not on file  .  Stress: Not on file  Relationships  . Social connections:    Talks on phone: Not on file    Gets together: Not on file    Attends religious service: Not on file    Active member of club or organization: Not on file    Attends meetings of clubs or organizations: Not on file    Relationship status: Not on file  Other Topics Concern  . Not on file  Social History Narrative  . Not on file    Past Medical History:  Diagnosis Date  . Frequent headaches   . GERD (gastroesophageal reflux disease)   . Hypertension   . Urine incontinence      Patient Active Problem List   Diagnosis Date Noted  . Anemia 05/30/2015  . Acid reflux 05/30/2015  . Hypertension 05/30/2015  . Hypercholesteremia 05/30/2015  . Urinary incontinence 05/30/2015  . Chronic low back pain 05/30/2015  . DDD (degenerative disc disease), lumbar 05/30/2015  . Systolic murmur 05/30/2015  . Gastrointestinal bleeding, upper 01/05/2015    Past Surgical History:  Procedure Laterality Date  . ABDOMINAL HYSTERECTOMY  1985  . TUBAL LIGATION      Family History        Family Status  Relation Name Status  . Mat Aunt  (Not Specified)        Her family  history includes Breast cancer in her maternal aunt.      No Known Allergies   Current Outpatient Medications:  .  atorvastatin (LIPITOR) 20 MG tablet, TAKE 1 TABLET BY MOUTH AT BEDTIME, Disp: 90 tablet, Rfl: 1 .  hydrochlorothiazide (HYDRODIURIL) 25 MG tablet, TAKE 1 TABLET BY MOUTH EVERY DAY, Disp: 90 tablet, Rfl: 1 .  HYDROcodone-acetaminophen (NORCO/VICODIN) 5-325 MG tablet, Take 1 tablet by mouth every 4 (four) hours as needed for moderate pain., Disp: 150 tablet, Rfl: 0 .  lisinopril (PRINIVIL,ZESTRIL) 20 MG tablet, TAKE 1 TABLET EVERY DAY, Disp: 90 tablet, Rfl: 1 .  pantoprazole (PROTONIX) 40 MG tablet, TAKE 1 TABLET (40 MG TOTAL) BY MOUTH DAILY., Disp: 90 tablet, Rfl: 1 .  clindamycin (CLEOCIN) 150 MG capsule, TAKE 1 CAPSULE (150 MG TOTAL) BY MOUTH 3 (THREE)  TIMES DAILY. (Patient not taking: Reported on 12/06/2017), Disp: 21 capsule, Rfl: 0 .  FLUoxetine (PROZAC) 10 MG capsule, TAKE 1 CAPSULE (10 MG TOTAL) BY MOUTH AT BEDTIME., Disp: 90 capsule, Rfl: 3 .  hydrOXYzine (ATARAX/VISTARIL) 50 MG tablet, TAKE 1 TABLET (50 MG TOTAL) BY MOUTH EVERY 6 (SIX) HOURS AS NEEDED., Disp: 270 tablet, Rfl: 1   Patient Care Team: Reine Just as PCP - General (Physician Assistant)      Objective:   Vitals: BP 137/76 (BP Location: Right Arm, Patient Position: Sitting, Cuff Size: Normal)   Pulse 80   Temp 98.4 F (36.9 C) (Oral)   Resp 16   Ht 5\' 1"  (1.549 m)   Wt 149 lb (67.6 kg)   SpO2 99%   BMI 28.15 kg/m    Vitals:   12/06/18 1614  BP: 137/76  Pulse: 80  Resp: 16  Temp: 98.4 F (36.9 C)  TempSrc: Oral  SpO2: 99%  Weight: 149 lb (67.6 kg)  Height: 5\' 1"  (1.549 m)     Physical Exam Vitals signs reviewed.  Constitutional:      General: She is not in acute distress.    Appearance: She is well-developed. She is not diaphoretic.  HENT:     Head: Normocephalic and atraumatic.     Right Ear: Hearing, tympanic membrane, ear canal and external ear normal.     Left Ear: Hearing, tympanic membrane, ear canal and external ear normal.     Nose: Nose normal.     Mouth/Throat:     Lips: Pink.     Pharynx: Oropharynx is clear. Uvula midline. No oropharyngeal exudate.  Eyes:     General: No scleral icterus.       Right eye: No discharge.        Left eye: No discharge.     Conjunctiva/sclera: Conjunctivae normal.     Pupils: Pupils are equal, round, and reactive to light.  Neck:     Musculoskeletal: Normal range of motion and neck supple.     Thyroid: No thyromegaly.     Vascular: No carotid bruit or JVD.     Trachea: No tracheal deviation.  Cardiovascular:     Rate and Rhythm: Normal rate and regular rhythm.     Heart sounds: Murmur present. Systolic murmur present with a grade of 2/6. No friction rub. No gallop.   Pulmonary:      Effort: Pulmonary effort is normal. No respiratory distress.     Breath sounds: Normal breath sounds. No wheezing or rales.  Chest:     Chest wall: No tenderness.  Abdominal:     General: Bowel sounds are normal. There  is no distension.     Palpations: Abdomen is soft. There is no mass.     Tenderness: There is no abdominal tenderness. There is no guarding or rebound.  Musculoskeletal: Normal range of motion.        General: No tenderness.  Lymphadenopathy:     Cervical: No cervical adenopathy.  Skin:    General: Skin is warm and dry.     Findings: No rash.  Neurological:     Mental Status: She is alert and oriented to person, place, and time.  Psychiatric:        Behavior: Behavior normal.        Thought Content: Thought content normal.        Judgment: Judgment normal.      Depression Screen PHQ 2/9 Scores 12/06/2018 05/30/2015  PHQ - 2 Score 0 0  PHQ- 9 Score 0 -      Assessment & Plan:     Routine Health Maintenance and Physical Exam  Exercise Activities and Dietary recommendations Goals   None     Immunization History  Administered Date(s) Administered  . Influenza-Unspecified 08/12/2018  . Rabies, IM 05/27/2018, 06/04/2018, 06/11/2018, 06/18/2018    Health Maintenance  Topic Date Due  . Janet BerlinETANUS/TDAP  09/18/1977  . COLONOSCOPY  05/23/2019  . MAMMOGRAM  05/30/2020  . INFLUENZA VACCINE  Completed  . Hepatitis C Screening  Completed  . PAP SMEAR-Modifier  Discontinued  . HIV Screening  Discontinued     Discussed health benefits of physical activity, and encouraged her to engage in regular exercise appropriate for her age and condition.    1. Annual physical exam Normal physical exam today. Will check labs as below and f/u pending lab results. If labs are stable and WNL she will not need to have these rechecked for one year at her next annual physical exam. She is to call the office in the meantime if she has any acute issue, questions or concerns.  2.  Breast cancer screening Mammogram due in July 2020.   3. Colon cancer screening Family history of colon cancer in grandmother (cause of death). Due for colonoscopy this year. Previously done by Dr. Servando SnareWohl.  - Ambulatory referral to Gastroenterology  4. Family history of colon cancer Grandmother.  5. Chronic narcotic use Yearly drug screen collected today. Continue Norco as prescribed.  - ToxASSURE Select 13 (MW), Urine  6. DDD (degenerative disc disease), lumbar See above medical treatment plan. - ToxASSURE Select 13 (MW), Urine  7. Essential hypertension Stable. Continue lisinopril 20mg . Will check labs as below and f/u pending results. - CBC w/Diff/Platelet - Comprehensive Metabolic Panel (CMET) - Lipid Profile - HgB A1c - TSH  8. Anemia, unspecified type Will check labs as below and f/u pending results. - CBC w/Diff/Platelet - Comprehensive Metabolic Panel (CMET)  9. Hypercholesteremia Stable. Continue atorvastatin 20mg . Will check labs as below and f/u pending results. - Comprehensive Metabolic Panel (CMET) - Lipid Profile - HgB A1c  10. BMI 28.0-28.9,adult Counseled patient on healthy lifestyle modifications including dieting and exercise.  - Comprehensive Metabolic Panel (CMET) - HgB A1c  --------------------------------------------------------------------    Margaretann LovelessJennifer M , PA-C  [facility]

## 2018-12-06 NOTE — Patient Instructions (Signed)
Health Maintenance for Postmenopausal Women Menopause is a normal process in which your reproductive ability comes to an end. This process happens gradually over a span of months to years, usually between the ages of 62 and 89. Menopause is complete when you have missed 12 consecutive menstrual periods. It is important to talk with your health care provider about some of the most common conditions that affect postmenopausal women, such as heart disease, cancer, and bone loss (osteoporosis). Adopting a healthy lifestyle and getting preventive care can help to promote your health and wellness. Those actions can also lower your chances of developing some of these common conditions. What should I know about menopause? During menopause, you may experience a number of symptoms, such as:  Moderate-to-severe hot flashes.  Night sweats.  Decrease in sex drive.  Mood swings.  Headaches.  Tiredness.  Irritability.  Memory problems.  Insomnia. Choosing to treat or not to treat menopausal changes is an individual decision that you make with your health care provider. What should I know about hormone replacement therapy and supplements? Hormone therapy products are effective for treating symptoms that are associated with menopause, such as hot flashes and night sweats. Hormone replacement carries certain risks, especially as you become older. If you are thinking about using estrogen or estrogen with progestin treatments, discuss the benefits and risks with your health care provider. What should I know about heart disease and stroke? Heart disease, heart attack, and stroke become more likely as you age. This may be due, in part, to the hormonal changes that your body experiences during menopause. These can affect how your body processes dietary fats, triglycerides, and cholesterol. Heart attack and stroke are both medical emergencies. There are many things that you can do to help prevent heart disease  and stroke:  Have your blood pressure checked at least every 1-2 years. High blood pressure causes heart disease and increases the risk of stroke.  If you are 79-72 years old, ask your health care provider if you should take aspirin to prevent a heart attack or a stroke.  Do not use any tobacco products, including cigarettes, chewing tobacco, or electronic cigarettes. If you need help quitting, ask your health care provider.  It is important to eat a healthy diet and maintain a healthy weight. ? Be sure to include plenty of vegetables, fruits, low-fat dairy products, and lean protein. ? Avoid eating foods that are high in solid fats, added sugars, or salt (sodium).  Get regular exercise. This is one of the most important things that you can do for your health. ? Try to exercise for at least 150 minutes each week. The type of exercise that you do should increase your heart rate and make you sweat. This is known as moderate-intensity exercise. ? Try to do strengthening exercises at least twice each week. Do these in addition to the moderate-intensity exercise.  Know your numbers.Ask your health care provider to check your cholesterol and your blood glucose. Continue to have your blood tested as directed by your health care provider.  What should I know about cancer screening? There are several types of cancer. Take the following steps to reduce your risk and to catch any cancer development as early as possible. Breast Cancer  Practice breast self-awareness. ? This means understanding how your breasts normally appear and feel. ? It also means doing regular breast self-exams. Let your health care provider know about any changes, no matter how small.  If you are 40 or  older, have a clinician do a breast exam (clinical breast exam or CBE) every year. Depending on your age, family history, and medical history, it may be recommended that you also have a yearly breast X-ray (mammogram).  If you  have a family history of breast cancer, talk with your health care provider about genetic screening.  If you are at high risk for breast cancer, talk with your health care provider about having an MRI and a mammogram every year.  Breast cancer (BRCA) gene test is recommended for women who have family members with BRCA-related cancers. Results of the assessment will determine the need for genetic counseling and BRCA1 and for BRCA2 testing. BRCA-related cancers include these types: ? Breast. This occurs in males or females. ? Ovarian. ? Tubal. This may also be called fallopian tube cancer. ? Cancer of the abdominal or pelvic lining (peritoneal cancer). ? Prostate. ? Pancreatic. Cervical, Uterine, and Ovarian Cancer Your health care provider may recommend that you be screened regularly for cancer of the pelvic organs. These include your ovaries, uterus, and vagina. This screening involves a pelvic exam, which includes checking for microscopic changes to the surface of your cervix (Pap test).  For women ages 21-65, health care providers may recommend a pelvic exam and a Pap test every three years. For women ages 39-65, they may recommend the Pap test and pelvic exam, combined with testing for human papilloma virus (HPV), every five years. Some types of HPV increase your risk of cervical cancer. Testing for HPV may also be done on women of any age who have unclear Pap test results.  Other health care providers may not recommend any screening for nonpregnant women who are considered low risk for pelvic cancer and have no symptoms. Ask your health care provider if a screening pelvic exam is right for you.  If you have had past treatment for cervical cancer or a condition that could lead to cancer, you need Pap tests and screening for cancer for at least 20 years after your treatment. If Pap tests have been discontinued for you, your risk factors (such as having a new sexual partner) need to be reassessed  to determine if you should start having screenings again. Some women have medical problems that increase the chance of getting cervical cancer. In these cases, your health care provider may recommend that you have screening and Pap tests more often.  If you have a family history of uterine cancer or ovarian cancer, talk with your health care provider about genetic screening.  If you have vaginal bleeding after reaching menopause, tell your health care provider.  There are currently no reliable tests available to screen for ovarian cancer. Lung Cancer Lung cancer screening is recommended for adults 57-50 years old who are at high risk for lung cancer because of a history of smoking. A yearly low-dose CT scan of the lungs is recommended if you:  Currently smoke.  Have a history of at least 30 pack-years of smoking and you currently smoke or have quit within the past 15 years. A pack-year is smoking an average of one pack of cigarettes per day for one year. Yearly screening should:  Continue until it has been 15 years since you quit.  Stop if you develop a health problem that would prevent you from having lung cancer treatment. Colorectal Cancer  This type of cancer can be detected and can often be prevented.  Routine colorectal cancer screening usually begins at age 12 and continues through  age 75.  If you have risk factors for colon cancer, your health care provider may recommend that you be screened at an earlier age.  If you have a family history of colorectal cancer, talk with your health care provider about genetic screening.  Your health care provider may also recommend using home test kits to check for hidden blood in your stool.  A small camera at the end of a tube can be used to examine your colon directly (sigmoidoscopy or colonoscopy). This is done to check for the earliest forms of colorectal cancer.  Direct examination of the colon should be repeated every 5-10 years until  age 75. However, if early forms of precancerous polyps or small growths are found or if you have a family history or genetic risk for colorectal cancer, you may need to be screened more often. Skin Cancer  Check your skin from head to toe regularly.  Monitor any moles. Be sure to tell your health care provider: ? About any new moles or changes in moles, especially if there is a change in a mole's shape or color. ? If you have a mole that is larger than the size of a pencil eraser.  If any of your family members has a history of skin cancer, especially at a young age, talk with your health care provider about genetic screening.  Always use sunscreen. Apply sunscreen liberally and repeatedly throughout the day.  Whenever you are outside, protect yourself by wearing long sleeves, pants, a wide-brimmed hat, and sunglasses. What should I know about osteoporosis? Osteoporosis is a condition in which bone destruction happens more quickly than new bone creation. After menopause, you may be at an increased risk for osteoporosis. To help prevent osteoporosis or the bone fractures that can happen because of osteoporosis, the following is recommended:  If you are 19-50 years old, get at least 1,000 mg of calcium and at least 600 mg of vitamin D per day.  If you are older than age 50 but younger than age 70, get at least 1,200 mg of calcium and at least 600 mg of vitamin D per day.  If you are older than age 70, get at least 1,200 mg of calcium and at least 800 mg of vitamin D per day. Smoking and excessive alcohol intake increase the risk of osteoporosis. Eat foods that are rich in calcium and vitamin D, and do weight-bearing exercises several times each week as directed by your health care provider. What should I know about how menopause affects my mental health? Depression may occur at any age, but it is more common as you become older. Common symptoms of depression include:  Low or sad  mood.  Changes in sleep patterns.  Changes in appetite or eating patterns.  Feeling an overall lack of motivation or enjoyment of activities that you previously enjoyed.  Frequent crying spells. Talk with your health care provider if you think that you are experiencing depression. What should I know about immunizations? It is important that you get and maintain your immunizations. These include:  Tetanus, diphtheria, and pertussis (Tdap) booster vaccine.  Influenza every year before the flu season begins.  Pneumonia vaccine.  Shingles vaccine. Your health care provider may also recommend other immunizations. This information is not intended to replace advice given to you by your health care provider. Make sure you discuss any questions you have with your health care provider. Document Released: 01/08/2006 Document Revised: 06/05/2016 Document Reviewed: 08/20/2015 Elsevier Interactive Patient Education    2019 [place].

## 2018-12-08 ENCOUNTER — Encounter: Payer: Self-pay | Admitting: Physician Assistant

## 2018-12-11 ENCOUNTER — Other Ambulatory Visit: Payer: Self-pay | Admitting: Physician Assistant

## 2018-12-11 DIAGNOSIS — M545 Low back pain: Principal | ICD-10-CM

## 2018-12-11 DIAGNOSIS — G8929 Other chronic pain: Secondary | ICD-10-CM

## 2018-12-12 LAB — TOXASSURE SELECT 13 (MW), URINE

## 2018-12-12 MED ORDER — HYDROCODONE-ACETAMINOPHEN 5-325 MG PO TABS: 1.0000 | ORAL_TABLET | ORAL | 0 refills | Status: DC | PRN

## 2018-12-17 LAB — CBC WITH DIFFERENTIAL/PLATELET
BASOS ABS: 0.1 10*3/uL (ref 0.0–0.2)
Basos: 1 %
EOS (ABSOLUTE): 0.2 10*3/uL (ref 0.0–0.4)
EOS: 3 %
HEMATOCRIT: 32.4 % — AB (ref 34.0–46.6)
HEMOGLOBIN: 11.2 g/dL (ref 11.1–15.9)
IMMATURE GRANS (ABS): 0 10*3/uL (ref 0.0–0.1)
Immature Granulocytes: 0 %
LYMPHS: 33 %
Lymphocytes Absolute: 2.8 10*3/uL (ref 0.7–3.1)
MCH: 30.9 pg (ref 26.6–33.0)
MCHC: 34.6 g/dL (ref 31.5–35.7)
MCV: 90 fL (ref 79–97)
MONOCYTES: 9 %
Monocytes Absolute: 0.8 10*3/uL (ref 0.1–0.9)
NEUTROS ABS: 4.7 10*3/uL (ref 1.4–7.0)
Neutrophils: 54 %
Platelets: 321 10*3/uL (ref 150–450)
RBC: 3.62 x10E6/uL — AB (ref 3.77–5.28)
RDW: 12.2 % (ref 11.7–15.4)
WBC: 8.6 10*3/uL (ref 3.4–10.8)

## 2018-12-17 LAB — COMPREHENSIVE METABOLIC PANEL
ALT: 8 IU/L (ref 0–32)
AST: 13 IU/L (ref 0–40)
Albumin/Globulin Ratio: 1.6 (ref 1.2–2.2)
Albumin: 4.5 g/dL (ref 3.6–4.8)
Alkaline Phosphatase: 75 IU/L (ref 39–117)
BUN/Creatinine Ratio: 21 (ref 12–28)
BUN: 24 mg/dL (ref 8–27)
Bilirubin Total: 0.4 mg/dL (ref 0.0–1.2)
CO2: 22 mmol/L (ref 20–29)
Calcium: 10.2 mg/dL (ref 8.7–10.3)
Chloride: 102 mmol/L (ref 96–106)
Creatinine, Ser: 1.13 mg/dL — ABNORMAL HIGH (ref 0.57–1.00)
GFR calc Af Amer: 61 mL/min/{1.73_m2} (ref >59)
GFR calc non Af Amer: 53 mL/min/{1.73_m2} — ABNORMAL LOW (ref >59)
GLOBULIN, TOTAL: 2.9 g/dL (ref 1.5–4.5)
Glucose: 97 mg/dL (ref 65–99)
Potassium: 4.3 mmol/L (ref 3.5–5.2)
SODIUM: 137 mmol/L (ref 134–144)
Total Protein: 7.4 g/dL (ref 6.0–8.5)

## 2018-12-17 LAB — HEMOGLOBIN A1C
ESTIMATED AVERAGE GLUCOSE: 108 mg/dL
Hgb A1c MFr Bld: 5.4 % (ref 4.8–5.6)

## 2018-12-17 LAB — TSH: TSH: 1.42 u[IU]/mL (ref 0.450–4.500)

## 2018-12-17 LAB — LIPID PANEL
Chol/HDL Ratio: 4 ratio (ref 0.0–4.4)
Cholesterol, Total: 164 mg/dL (ref 100–199)
HDL: 41 mg/dL (ref >39)
LDL Calculated: 86 mg/dL (ref 0–99)
Triglycerides: 184 mg/dL — ABNORMAL HIGH (ref 0–149)
VLDL Cholesterol Cal: 37 mg/dL (ref 5–40)

## 2018-12-19 ENCOUNTER — Telehealth: Payer: Self-pay

## 2018-12-19 ENCOUNTER — Encounter: Payer: Self-pay | Admitting: *Deleted

## 2018-12-19 NOTE — Telephone Encounter (Signed)
-----   Message from Margaretann LovelessJennifer M Burnette, [place]PA-C sent at 12/19/2018  8:40 AM EST ----- All labs a re stable when compared to previous years. Push fluids and continue healthy dieting habits and slowly increase physical activity as tolerated.

## 2018-12-19 NOTE — Telephone Encounter (Signed)
Advised  ED 

## 2018-12-19 NOTE — Telephone Encounter (Signed)
lmtcb

## 2019-01-01 ENCOUNTER — Other Ambulatory Visit: Payer: Self-pay | Admitting: Physician Assistant

## 2019-01-01 DIAGNOSIS — M545 Low back pain, unspecified: Secondary | ICD-10-CM

## 2019-01-01 DIAGNOSIS — G8929 Other chronic pain: Secondary | ICD-10-CM

## 2019-01-02 MED ORDER — HYDROCODONE-ACETAMINOPHEN 5-325 MG PO TABS: 1.0000 | ORAL_TABLET | ORAL | 0 refills | Status: DC | PRN

## 2019-01-24 ENCOUNTER — Other Ambulatory Visit: Payer: Self-pay | Admitting: Physician Assistant

## 2019-01-24 DIAGNOSIS — G8929 Other chronic pain: Secondary | ICD-10-CM

## 2019-01-24 DIAGNOSIS — M545 Low back pain: Principal | ICD-10-CM

## 2019-01-25 MED ORDER — HYDROCODONE-ACETAMINOPHEN 5-325 MG PO TABS: 1.0000 | ORAL_TABLET | ORAL | 0 refills | Status: DC | PRN

## 2019-02-12 ENCOUNTER — Other Ambulatory Visit: Payer: Self-pay | Admitting: Physician Assistant

## 2019-02-12 DIAGNOSIS — I10 Essential (primary) hypertension: Secondary | ICD-10-CM

## 2019-02-15 ENCOUNTER — Other Ambulatory Visit: Payer: Self-pay | Admitting: Physician Assistant

## 2019-02-15 DIAGNOSIS — M545 Low back pain: Principal | ICD-10-CM

## 2019-02-15 DIAGNOSIS — G8929 Other chronic pain: Secondary | ICD-10-CM

## 2019-02-15 MED ORDER — HYDROCODONE-ACETAMINOPHEN 5-325 MG PO TABS: 1.0000 | ORAL_TABLET | ORAL | 0 refills | Status: DC | PRN

## 2019-03-09 ENCOUNTER — Other Ambulatory Visit: Payer: Self-pay | Admitting: Physician Assistant

## 2019-03-09 DIAGNOSIS — M545 Low back pain, unspecified: Secondary | ICD-10-CM

## 2019-03-09 DIAGNOSIS — G8929 Other chronic pain: Secondary | ICD-10-CM

## 2019-03-09 MED ORDER — HYDROCODONE-ACETAMINOPHEN 5-325 MG PO TABS: 1.0000 | ORAL_TABLET | ORAL | 0 refills | Status: DC | PRN

## 2019-03-10 ENCOUNTER — Encounter: Payer: Self-pay | Admitting: Physician Assistant

## 2019-03-20 ENCOUNTER — Other Ambulatory Visit: Payer: Self-pay | Admitting: Physician Assistant

## 2019-03-20 DIAGNOSIS — K219 Gastro-esophageal reflux disease without esophagitis: Secondary | ICD-10-CM

## 2019-03-28 DIAGNOSIS — H00014 Hordeolum externum left upper eyelid: Secondary | ICD-10-CM | POA: Diagnosis not present

## 2019-03-30 ENCOUNTER — Other Ambulatory Visit: Payer: Self-pay | Admitting: Physician Assistant

## 2019-03-30 DIAGNOSIS — G8929 Other chronic pain: Secondary | ICD-10-CM

## 2019-03-30 DIAGNOSIS — M545 Low back pain, unspecified: Secondary | ICD-10-CM

## 2019-03-31 MED ORDER — HYDROCODONE-ACETAMINOPHEN 5-325 MG PO TABS: 1.0000 | ORAL_TABLET | ORAL | 0 refills | Status: DC | PRN

## 2019-03-31 NOTE — Telephone Encounter (Signed)
Please Review

## 2019-04-14 ENCOUNTER — Other Ambulatory Visit: Payer: Self-pay | Admitting: Physician Assistant

## 2019-04-14 DIAGNOSIS — I10 Essential (primary) hypertension: Secondary | ICD-10-CM

## 2019-04-20 ENCOUNTER — Other Ambulatory Visit: Payer: Self-pay | Admitting: Physician Assistant

## 2019-04-20 DIAGNOSIS — G8929 Other chronic pain: Secondary | ICD-10-CM

## 2019-04-21 ENCOUNTER — Other Ambulatory Visit: Payer: Self-pay | Admitting: Physician Assistant

## 2019-04-21 DIAGNOSIS — E7800 Pure hypercholesterolemia, unspecified: Secondary | ICD-10-CM

## 2019-04-23 MED ORDER — HYDROCODONE-ACETAMINOPHEN 5-325 MG PO TABS: 1.0000 | ORAL_TABLET | ORAL | 0 refills | Status: DC | PRN

## 2019-05-02 ENCOUNTER — Telehealth: Payer: Self-pay

## 2019-05-02 NOTE — Telephone Encounter (Signed)
Patient called that sh paid out of pocket and that she will continue to pay and to don't do the PA for the Hydrocodone-Acetaminophen

## 2019-05-11 ENCOUNTER — Telehealth: Payer: 59 | Admitting: Family

## 2019-05-11 DIAGNOSIS — S40861A Insect bite (nonvenomous) of right upper arm, initial encounter: Secondary | ICD-10-CM

## 2019-05-11 DIAGNOSIS — W57XXXA Bitten or stung by nonvenomous insect and other nonvenomous arthropods, initial encounter: Secondary | ICD-10-CM | POA: Diagnosis not present

## 2019-05-11 MED ORDER — DOXYCYCLINE HYCLATE 100 MG PO TABS: 100.0000 mg | ORAL_TABLET | Freq: Two times a day (BID) | ORAL | 0 refills | Status: DC

## 2019-05-11 NOTE — Progress Notes (Signed)
Greater than 5 minutes, yet less than 10 minutes of time have been spent researching, coordinating, and implementing care for this patient today.  Thank you for the details you included in the comment boxes. Those details are very helpful in determining the best course of treatment for you and help us to provide the best care.  Thank you for describing your tick bite, Here is how we plan to help! Based on the information that you shared with me it looks like you have An infected or complicated tick bite that requires a longer course of antibiotics and will need for you to schedule a follow-up visit with a provider.  In most cases a tick bite is painless and does not itch.  Most tick bites in which the tick is quickly removed do not require prescriptions. Ticks can transmit several diseases if they are infected and remain attacked to your skin. Therefore the length that the tick was attached and any symptoms you have experienced after the bite are import to accurately develop your custom treatment plan. In most cases a single dose of doxycycline may prevent the development of a more serious condition.  Based on your information I have Provided a home care guide for tick bites  and  instructions on when to call for help. and Your symptoms indicate that you need a longer course of antibiotics and a follow up visit with a provider. I have sent doxycycline 100 mg twice a day for 21 days to the pharmacy that you selected. You will need to schedule a follow up visit with your provider. If you do not have a primary care provider you may use our telehealth physicians on the web at [facility].  Which ticks  are associated with illness?  The Wood Tick (dog tick) is the size of a watermelon seed and can sometimes transmit Rocky [place] spotted fever and [place] tick fever.   The Deer Tick (black-legged tick) is between the size of a poppy seed (pin head) and an apple seed, and can sometimes transmit  Lyme disease.  A brown to black tick with a white splotch on its back is likely a female Amblyomma americanum ([place] tick). This tick has been associated with Southern Tick Associated illness ( STARI)  Lyme disease has become the most common tick-borne illness in the [place]. The risk of Lyme disease following a recognized deer tick bite is estimated to be 1%.  The majority of cases of Lyme disease start with a bull's eye rash at the site of the tick bite. The rash can occur days to weeks (typically 7-10 days) after a tick bite. Treatment with antibiotics is indicated if this rash appears. Flu-like symptoms may accompany the rash, including: fever, chills, headaches, muscle aches, and fatigue. Removing ticks promptly may prevent tick borne disease.  What can be used to prevent Tick Bites?   Insect repellant with at leas 20% DEET.  Wearing long pants with sock and shoes.  Avoiding tall grass and heavily wooded areas.  Checking your skin after being outdoors.  Shower with a washcloth after outdoor exposures.  HOME CARE ADVICE FOR TICK BITE  1. Wood Tick Removal:  o Use a pair of tweezers and grasp the wood tick close to the skin (on its head). Pull the wood tick straight upward without twisting or crushing it. Maintain a steady pressure until it releases its grip.   o If tweezers aren't available, use fingers, a loop of thread around the jaws,   or a needle between the jaws for traction.  o Note: covering the tick with petroleum jelly, nail polish or rubbing alcohol doesn't work. Neither does touching the tick with a hot or cold object. 2. Tiny Deer Tick Removal:   o Needs to be scraped off with a knife blade or credit card edge. o Place tick in a sealed container (e.g. glass jar, zip lock plastic bag), in case your doctor wants to see it. 3. Tick's Head Removal:  o If the wood tick's head breaks off in the skin, it must be removed. Clean the skin. Then use a sterile needle to  uncover the head and lift it out or scrape it off.  o If a very small piece of the head remains, the skin will eventually slough it off. 4. Antibiotic Ointment:  o Wash the wound and your hands with soap and water after removal to prevent catching any tick disease.  Apply an over the counter antibiotic ointment (e.g. bacitracin) to the bite once. 5. Expected Course: Tick bites normally don't itch or hurt. That's why they often go unnoticed. 6. Call Your Doctor If:  o You can't remove the tick or the tick's head o Fever, a severe head ache, or rash occur in the next 2 weeks o Bite begins to look infected o Lyme's disease is common in your area o You have not had a tetanus in the last 10 years o Your current symptoms become worse    MAKE SURE YOU   Understand these instructions.  Will watch your condition.  Will get help right away if you are not doing well or get worse.   Thank you for choosing an e-visit.  Your e-visit answers were reviewed by a board certified advanced clinical practitioner to complete your personal care plan. Depending upon the condition, your plan could have included both over the counter or prescription medications. Please review your pharmacy choice. If there is a problem you may use MyChart messaging to have the prescription routed to another pharmacy. Your safety is important to us. If you have drug allergies check your prescription carefully.   You can use MyChart to ask questions about today's visit, request a non-urgent call back, or ask for a work or school excuse for 24 hours related to this e-Visit. If it has been greater than 24 hours you will need to follow up with your provider, or enter a new e-Visit to address those concerns.  You will get an email in the next two days asking about your experience. I hope  that your e-visit has been valuable and will speed your recovery   

## 2019-05-14 ENCOUNTER — Other Ambulatory Visit: Payer: Self-pay | Admitting: Family Medicine

## 2019-05-14 DIAGNOSIS — G8929 Other chronic pain: Secondary | ICD-10-CM

## 2019-05-15 MED ORDER — HYDROCODONE-ACETAMINOPHEN 5-325 MG PO TABS: 1.0000 | ORAL_TABLET | ORAL | 0 refills | Status: DC | PRN

## 2019-06-05 ENCOUNTER — Other Ambulatory Visit: Payer: Self-pay | Admitting: Physician Assistant

## 2019-06-05 DIAGNOSIS — G8929 Other chronic pain: Secondary | ICD-10-CM

## 2019-06-05 DIAGNOSIS — M545 Low back pain, unspecified: Secondary | ICD-10-CM

## 2019-06-06 MED ORDER — HYDROCODONE-ACETAMINOPHEN 5-325 MG PO TABS: 1.0000 | ORAL_TABLET | ORAL | 0 refills | Status: DC | PRN

## 2019-06-07 ENCOUNTER — Ambulatory Visit: Payer: Managed Care, Other (non HMO) | Admitting: Physician Assistant

## 2019-06-07 ENCOUNTER — Encounter: Payer: Self-pay | Admitting: Physician Assistant

## 2019-06-07 ENCOUNTER — Other Ambulatory Visit: Payer: Self-pay

## 2019-06-07 ENCOUNTER — Telehealth (INDEPENDENT_AMBULATORY_CARE_PROVIDER_SITE_OTHER): Payer: 59 | Admitting: Physician Assistant

## 2019-06-07 VITALS — BP 150/81 | HR 91 | Wt 150.0 lb

## 2019-06-07 DIAGNOSIS — M5136 Other intervertebral disc degeneration, lumbar region: Secondary | ICD-10-CM | POA: Diagnosis not present

## 2019-06-07 DIAGNOSIS — R69 Illness, unspecified: Secondary | ICD-10-CM | POA: Diagnosis not present

## 2019-06-07 DIAGNOSIS — F1190 Opioid use, unspecified, uncomplicated: Secondary | ICD-10-CM | POA: Diagnosis not present

## 2019-06-07 NOTE — Progress Notes (Signed)
Patient: Priscilla PostinJane C Vondrasek Female    DOB: 21-Apr-1956   63 y.o.   MRN: [id number] Visit Date: 06/07/2019  Today's Provider: Margaretann LovelessJennifer M Burnette, PA-C   No chief complaint on file.  Subjective:    I,Joseline E. Rosas,RMA am acting as a Neurosurgeonscribe for PPG IndustriesJennifer M. Burnette, PA-C.  Virtual Visit via Video Note  I connected with Priscilla Meza on 06/07/19 at  8:40 AM EDT by a video enabled telemedicine application and verified that I am speaking with the correct person using two identifiers.  Location: Patient: home Provider: [facility]   I discussed the limitations of evaluation and management by telemedicine and the availability of in person appointments. The patient expressed understanding and agreed to proceed.   HPI  Patient seeing provider for 6 months follow up chronic narcotic use. Reports she is actually doing slightly better now since she is working from home. She has an activity tracker and has it set to alert her every hour to get up and move. This helps keep her from getting stiff and having issues with moving around.   No Known Allergies   Current Outpatient Medications:  .  atorvastatin (LIPITOR) 20 MG tablet, TAKE 1 TABLET BY MOUTH AT BEDTIME, Disp: 90 tablet, Rfl: 1 .  doxycycline (VIBRA-TABS) 100 MG tablet, Take 1 tablet (100 mg total) by mouth 2 (two) times daily., Disp: 42 tablet, Rfl: 0 .  hydrochlorothiazide (HYDRODIURIL) 25 MG tablet, TAKE 1 TABLET BY MOUTH EVERY DAY, Disp: 90 tablet, Rfl: 1 .  HYDROcodone-acetaminophen (NORCO/VICODIN) 5-325 MG tablet, Take 1 tablet by mouth every 4 (four) hours as needed for moderate pain., Disp: 150 tablet, Rfl: 0 .  lisinopril (ZESTRIL) 20 MG tablet, TAKE 1 TABLET EVERY DAY, Disp: 90 tablet, Rfl: 1 .  pantoprazole (PROTONIX) 40 MG tablet, TAKE 1 TABLET (40 MG TOTAL) BY MOUTH DAILY., Disp: 90 tablet, Rfl: 1  Review of Systems  Constitutional: Negative.   Respiratory: Negative.   Cardiovascular: Negative.   Musculoskeletal: Positive  for arthralgias, back pain, gait problem and myalgias.  Neurological: Negative for weakness and numbness.    Social History   Tobacco Use  . Smoking status: Former Games developermoker  . Smokeless tobacco: Never Used  . Tobacco comment: quit in 12/2013  Substance Use Topics  . Alcohol use: No    Alcohol/week: 0.0 standard drinks      Objective:   There were no vitals taken for this visit. There were no vitals filed for this visit.   Physical Exam Vitals signs reviewed.  Constitutional:      General: She is not in acute distress.    Appearance: Normal appearance. She is well-developed. She is obese. She is not ill-appearing.  HENT:     Head: Normocephalic and atraumatic.  Neck:     Musculoskeletal: Normal range of motion and neck supple.  Pulmonary:     Effort: Pulmonary effort is normal. No respiratory distress.  Neurological:     Mental Status: She is alert.  Psychiatric:        Mood and Affect: Mood normal.        Behavior: Behavior normal.        Thought Content: Thought content normal.        Judgment: Judgment normal.      No results found for any visits on 06/07/19.     Assessment & Plan     1. DDD (degenerative disc disease), lumbar Stable on Hydrocodone-APAP 5-325mg  to take 1  tab PO every 4-6 hours. NCCSR has been reviewed with every fill and she always gets from same provider and fills at the same pharmacy. No red flags noted. She will be due back in the office in 6 months for CPE.   2. Chronic narcotic use See above medical treatment plan.  I discussed the assessment and treatment plan with the patient. The patient was provided an opportunity to ask questions and all were answered. The patient agreed with the plan and demonstrated an understanding of the instructions.   The patient was advised to call back or seek an in-person evaluation if the symptoms worsen or if the condition fails to improve as anticipated.  I provided 17 minutes of non-face-to-face time  during this encounter.    Mar Daring, PA-C  [facility]

## 2019-06-27 ENCOUNTER — Other Ambulatory Visit: Payer: Self-pay | Admitting: Physician Assistant

## 2019-06-27 DIAGNOSIS — G8929 Other chronic pain: Secondary | ICD-10-CM

## 2019-06-27 MED ORDER — HYDROCODONE-ACETAMINOPHEN 5-325 MG PO TABS: 1.0000 | ORAL_TABLET | ORAL | 0 refills | Status: DC | PRN

## 2019-07-19 ENCOUNTER — Other Ambulatory Visit: Payer: Self-pay | Admitting: Physician Assistant

## 2019-07-19 DIAGNOSIS — G8929 Other chronic pain: Secondary | ICD-10-CM

## 2019-07-19 MED ORDER — HYDROCODONE-ACETAMINOPHEN 5-325 MG PO TABS: 1.0000 | ORAL_TABLET | ORAL | 0 refills | Status: DC | PRN

## 2019-08-06 ENCOUNTER — Other Ambulatory Visit: Payer: Self-pay | Admitting: Physician Assistant

## 2019-08-06 DIAGNOSIS — I10 Essential (primary) hypertension: Secondary | ICD-10-CM

## 2019-08-10 ENCOUNTER — Other Ambulatory Visit: Payer: Self-pay | Admitting: Physician Assistant

## 2019-08-10 DIAGNOSIS — G8929 Other chronic pain: Secondary | ICD-10-CM

## 2019-08-10 MED ORDER — HYDROCODONE-ACETAMINOPHEN 5-325 MG PO TABS: 1.0000 | ORAL_TABLET | ORAL | 0 refills | Status: DC | PRN

## 2019-09-01 ENCOUNTER — Other Ambulatory Visit: Payer: Self-pay | Admitting: Physician Assistant

## 2019-09-01 DIAGNOSIS — G8929 Other chronic pain: Secondary | ICD-10-CM

## 2019-09-01 DIAGNOSIS — M545 Low back pain, unspecified: Secondary | ICD-10-CM

## 2019-09-01 MED ORDER — HYDROCODONE-ACETAMINOPHEN 5-325 MG PO TABS: 1.0000 | ORAL_TABLET | ORAL | 0 refills | Status: DC | PRN

## 2019-09-04 ENCOUNTER — Other Ambulatory Visit: Payer: Self-pay | Admitting: Physician Assistant

## 2019-09-04 DIAGNOSIS — K219 Gastro-esophageal reflux disease without esophagitis: Secondary | ICD-10-CM

## 2019-09-06 DIAGNOSIS — Z23 Encounter for immunization: Secondary | ICD-10-CM | POA: Diagnosis not present

## 2019-09-22 ENCOUNTER — Other Ambulatory Visit: Payer: Self-pay | Admitting: Physician Assistant

## 2019-09-22 DIAGNOSIS — G8929 Other chronic pain: Secondary | ICD-10-CM

## 2019-09-22 MED ORDER — HYDROCODONE-ACETAMINOPHEN 5-325 MG PO TABS: 1.0000 | ORAL_TABLET | ORAL | 0 refills | Status: DC | PRN

## 2019-09-25 ENCOUNTER — Other Ambulatory Visit: Payer: Self-pay | Admitting: Physician Assistant

## 2019-09-25 DIAGNOSIS — I10 Essential (primary) hypertension: Secondary | ICD-10-CM

## 2019-10-03 ENCOUNTER — Other Ambulatory Visit: Payer: Self-pay | Admitting: Physician Assistant

## 2019-10-03 DIAGNOSIS — I10 Essential (primary) hypertension: Secondary | ICD-10-CM

## 2019-10-09 ENCOUNTER — Other Ambulatory Visit: Payer: Self-pay | Admitting: Physician Assistant

## 2019-10-09 DIAGNOSIS — E7800 Pure hypercholesterolemia, unspecified: Secondary | ICD-10-CM

## 2019-11-06 ENCOUNTER — Other Ambulatory Visit: Payer: Self-pay | Admitting: Physician Assistant

## 2019-11-06 DIAGNOSIS — M545 Low back pain, unspecified: Secondary | ICD-10-CM

## 2019-11-06 MED ORDER — HYDROCODONE-ACETAMINOPHEN 5-325 MG PO TABS: 1.0000 | ORAL_TABLET | ORAL | 0 refills | Status: DC | PRN

## 2019-12-03 ENCOUNTER — Other Ambulatory Visit: Payer: Self-pay | Admitting: Physician Assistant

## 2019-12-03 DIAGNOSIS — M545 Low back pain, unspecified: Secondary | ICD-10-CM

## 2019-12-03 DIAGNOSIS — G8929 Other chronic pain: Secondary | ICD-10-CM

## 2019-12-04 MED ORDER — HYDROCODONE-ACETAMINOPHEN 5-325 MG PO TABS: 1.0000 | ORAL_TABLET | ORAL | 0 refills | Status: DC | PRN

## 2019-12-08 ENCOUNTER — Encounter: Payer: Self-pay | Admitting: Physician Assistant

## 2019-12-08 ENCOUNTER — Ambulatory Visit: Payer: 59 | Admitting: Physician Assistant

## 2019-12-21 ENCOUNTER — Encounter: Payer: 59 | Admitting: Physician Assistant

## 2019-12-26 ENCOUNTER — Other Ambulatory Visit: Payer: Self-pay | Admitting: Physician Assistant

## 2019-12-26 DIAGNOSIS — G8929 Other chronic pain: Secondary | ICD-10-CM

## 2019-12-27 ENCOUNTER — Other Ambulatory Visit: Payer: Self-pay | Admitting: Physician Assistant

## 2019-12-27 DIAGNOSIS — Z1231 Encounter for screening mammogram for malignant neoplasm of breast: Secondary | ICD-10-CM

## 2019-12-27 MED ORDER — HYDROCODONE-ACETAMINOPHEN 5-325 MG PO TABS: 1.0000 | ORAL_TABLET | ORAL | 0 refills | Status: DC | PRN

## 2020-01-01 ENCOUNTER — Ambulatory Visit
Admission: RE | Admit: 2020-01-01 | Discharge: 2020-01-01 | Disposition: A | Discharge status: Home / Self Care | Payer: 59 | Source: Ambulatory Visit | Attending: Physician Assistant | Admitting: Physician Assistant

## 2020-01-01 DIAGNOSIS — Z1231 Encounter for screening mammogram for malignant neoplasm of breast: Secondary | ICD-10-CM

## 2020-01-01 NOTE — Progress Notes (Signed)
Patient: Priscilla Meza, Female    DOB: November 02, 1956, 64 y.o.   MRN: [id number] Visit Date: 01/04/2020  Today's Provider: Mar Daring, PA-C   Chief Complaint  Patient presents with  . Annual Exam   Subjective:     Annual physical exam Priscilla Meza is a 64 y.o. female who presents today for health maintenance and complete physical. She feels well. She reports exercising no. She reports she is sleeping well.  01/01/2020 Mammogram-BI-RADS 1 06/07/2014 Colonoscopy-internal hemorrhaoids Tdap-overdue-pt refused today -----------------------------------------------------------------   Review of Systems  Constitutional: Negative.   HENT: Negative.   Eyes: Negative.   Respiratory: Negative.   Cardiovascular: Negative.   Gastrointestinal: Negative.   Endocrine: Negative.   Genitourinary: Negative.   Musculoskeletal: Negative.   Skin: Negative.   Allergic/Immunologic: Negative.   Neurological: Negative.   Hematological: Negative.   Psychiatric/Behavioral: Negative.     Social History      She  reports that she has quit smoking. She has never used smokeless tobacco. She reports that she does not drink alcohol or use drugs.       Social History   Socioeconomic History  . Marital status: Widowed    Spouse name: Not on file  . Number of children: Not on file  . Years of education: Not on file  . Highest education level: Not on file  Occupational History  . Not on file  Tobacco Use  . Smoking status: Former Research scientist (life sciences)  . Smokeless tobacco: Never Used  . Tobacco comment: quit in 12/2013  Substance and Sexual Activity  . Alcohol use: No    Alcohol/week: 0.0 standard drinks  . Drug use: No  . Sexual activity: Not on file  Other Topics Concern  . Not on file  Social History Narrative  . Not on file   Social Determinants of Health   Financial Resource Strain:   . Difficulty of Paying Living Expenses: Not on file  Food Insecurity:   . Worried About Ship broker in the Last Year: Not on file  . Ran Out of Food in the Last Year: Not on file  Transportation Needs:   . Lack of Transportation (Medical): Not on file  . Lack of Transportation (Non-Medical): Not on file  Physical Activity:   . Days of Exercise per Week: Not on file  . Minutes of Exercise per Session: Not on file  Stress:   . Feeling of Stress : Not on file  Social Connections:   . Frequency of Communication with Friends and Family: Not on file  . Frequency of Social Gatherings with Friends and Family: Not on file  . Attends Religious Services: Not on file  . Active Member of Clubs or Organizations: Not on file  . Attends Archivist Meetings: Not on file  . Marital Status: Not on file    Past Medical History:  Diagnosis Date  . Frequent headaches   . GERD (gastroesophageal reflux disease)   . Hypertension   . Urine incontinence      Patient Active Problem List   Diagnosis Date Noted  . Chronic narcotic use 06/07/2019  . Family history of colon cancer 12/06/2018  . Anemia 05/30/2015  . Acid reflux 05/30/2015  . Hypertension 05/30/2015  . Hypercholesteremia 05/30/2015  . Urinary incontinence 05/30/2015  . Chronic low back pain 05/30/2015  . DDD (degenerative disc disease), lumbar 05/30/2015  . Systolic murmur 75/08/2584  . Gastrointestinal bleeding, upper 01/05/2015  Past Surgical History:  Procedure Laterality Date  . ABDOMINAL HYSTERECTOMY  1985  . TUBAL LIGATION      Family History        Family Status  Relation Name Status  . Mat Aunt  (Not Specified)        Her family history includes Breast cancer in her maternal aunt.      No Known Allergies   Current Outpatient Medications:  .  atorvastatin (LIPITOR) 20 MG tablet, TAKE 1 TABLET BY MOUTH EVERYDAY AT BEDTIME, Disp: 30 tablet, Rfl: 5 .  hydrochlorothiazide (HYDRODIURIL) 25 MG tablet, TAKE 1 TABLET BY MOUTH EVERY DAY, Disp: 90 tablet, Rfl: 1 .  HYDROcodone-acetaminophen  (NORCO/VICODIN) 5-325 MG tablet, Take 1 tablet by mouth every 4 (four) hours as needed for moderate pain., Disp: 150 tablet, Rfl: 0 .  lisinopril (ZESTRIL) 20 MG tablet, TAKE 1 TABLET BY MOUTH EVERY DAY, Disp: 30 tablet, Rfl: 3 .  pantoprazole (PROTONIX) 40 MG tablet, TAKE 1 TABLET BY MOUTH EVERY DAY, Disp: 30 tablet, Rfl: 5   Patient Care Team: Reine Just as PCP - General (Physician Assistant)    Objective:    Vitals: BP 118/70 (BP Location: Left Arm, Patient Position: Sitting, Cuff Size: Normal)   Pulse 75   Temp (!) 96.9 F (36.1 C) (Temporal)   Resp 16   Ht 5' 1.5" (1.562 m)   Wt 137 lb (62.1 kg)   BMI 25.47 kg/m    Vitals:   01/04/20 0905 01/04/20 0906  BP: (!) 143/78 118/70  Pulse: 71 75  Resp: 16   Temp: (!) 96.9 F (36.1 C)   TempSrc: Temporal   Weight: 137 lb (62.1 kg)   Height: 5' 1.5" (1.562 m)      Physical Exam Vitals reviewed.  Constitutional:      General: She is not in acute distress.    Appearance: Normal appearance. She is well-developed and normal weight. She is not ill-appearing or diaphoretic.  HENT:     Head: Normocephalic and atraumatic.     Right Ear: Tympanic membrane, ear canal and external ear normal.     Left Ear: Tympanic membrane, ear canal and external ear normal.  Eyes:     General: No scleral icterus.       Right eye: No discharge.        Left eye: No discharge.     Extraocular Movements: Extraocular movements intact.     Conjunctiva/sclera: Conjunctivae normal.     Pupils: Pupils are equal, round, and reactive to light.  Neck:     Thyroid: No thyromegaly.     Vascular: No carotid bruit or JVD.     Trachea: No tracheal deviation.  Cardiovascular:     Rate and Rhythm: Normal rate and regular rhythm.     Pulses: Normal pulses.     Heart sounds: Murmur present. Systolic murmur present with a grade of 2/6. No friction rub. No gallop.   Pulmonary:     Effort: Pulmonary effort is normal. No respiratory distress.      Breath sounds: Normal breath sounds. No wheezing or rales.  Chest:     Chest wall: No tenderness.  Abdominal:     General: Bowel sounds are normal. There is no distension.     Palpations: Abdomen is soft. There is no mass.     Tenderness: There is no abdominal tenderness. There is no guarding or rebound.  Musculoskeletal:        General: No tenderness.  Normal range of motion.     Cervical back: Normal range of motion and neck supple.  Lymphadenopathy:     Cervical: No cervical adenopathy.  Skin:    General: Skin is warm and dry.     Findings: No rash.  Neurological:     Mental Status: She is alert and oriented to person, place, and time.  Psychiatric:        Behavior: Behavior normal.        Thought Content: Thought content normal.        Judgment: Judgment normal.      Depression Screen PHQ 2/9 Scores 01/04/2020 12/06/2018 05/30/2015  PHQ - 2 Score 0 0 0  PHQ- 9 Score 1 0 -       Assessment & Plan:     Routine Health Maintenance and Physical Exam  Exercise Activities and Dietary recommendations Goals   None     Immunization History  Administered Date(s) Administered  . Influenza-Unspecified 08/12/2018  . Rabies, IM 05/27/2018, 06/04/2018, 06/11/2018, 06/18/2018    Health Maintenance  Topic Date Due  . Janet Berlin  09/18/1977  . COLONOSCOPY  05/23/2019  . INFLUENZA VACCINE  07/01/2019  . MAMMOGRAM  12/31/2021  . Hepatitis C Screening  Completed  . PAP SMEAR-Modifier  Discontinued  . HIV Screening  Discontinued     Discussed health benefits of physical activity, and encouraged her to engage in regular exercise appropriate for her age and condition.    1. Annual physical exam Normal physical exam today. Will check labs as below and f/u pending lab results. If labs are stable and WNL she will not need to have these rechecked for one year at her next annual physical exam. She is to call the office in the meantime if she has any acute issue, questions or  concerns. - CBC w/Diff/Platelet - Comprehensive Metabolic Panel (CMET) - TSH - Lipid Panel With LDL/HDL Ratio - HgB A1c  2. Colon cancer screening Family history in maternal grandmother. Dr. Servando Snare did last in 2015. Referral placed for colonoscopy. - Ambulatory referral to Gastroenterology  3. Essential hypertension Stable. Continue HCTZ 25mg , Lisinopril 20mg . Will check labs as below and f/u pending results. - CBC w/Diff/Platelet - Comprehensive Metabolic Panel (CMET) - Lipid Panel With LDL/HDL Ratio - HgB A1c  4. Hypercholesteremia Stable. Continue Atorvastatin 20mg . Will check labs as below and f/u pending results. - CBC w/Diff/Platelet - Comprehensive Metabolic Panel (CMET) - Lipid Panel With LDL/HDL Ratio - HgB A1c  5. Family history of colon cancer Grandmother as stated above.  --------------------------------------------------------------------    , PA-C  [facility]

## 2020-01-02 ENCOUNTER — Telehealth: Payer: Self-pay

## 2020-01-02 NOTE — Telephone Encounter (Signed)
-----   Message from Margaretann Loveless, [place] sent at 01/02/2020  1:58 PM EST ----- Normal mammogram. Repeat screening in one year.

## 2020-01-02 NOTE — Telephone Encounter (Signed)
Comment seen by patient Priscilla Meza on 01/02/2020 2:17 PM EST

## 2020-01-04 ENCOUNTER — Other Ambulatory Visit: Payer: Self-pay

## 2020-01-04 ENCOUNTER — Ambulatory Visit (INDEPENDENT_AMBULATORY_CARE_PROVIDER_SITE_OTHER): Payer: 59 | Admitting: Physician Assistant

## 2020-01-04 ENCOUNTER — Encounter: Payer: Self-pay | Admitting: Physician Assistant

## 2020-01-04 VITALS — BP 118/70 | HR 75 | Temp 96.9°F | Resp 16 | Ht 61.5 in | Wt 137.0 lb

## 2020-01-04 DIAGNOSIS — E7800 Pure hypercholesterolemia, unspecified: Secondary | ICD-10-CM | POA: Diagnosis not present

## 2020-01-04 DIAGNOSIS — Z0000 Encounter for general adult medical examination without abnormal findings: Secondary | ICD-10-CM | POA: Diagnosis not present

## 2020-01-04 DIAGNOSIS — Z800 Family history of malignant neoplasm of digestive organs: Secondary | ICD-10-CM

## 2020-01-04 DIAGNOSIS — Z1211 Encounter for screening for malignant neoplasm of colon: Secondary | ICD-10-CM | POA: Diagnosis not present

## 2020-01-04 DIAGNOSIS — I10 Essential (primary) hypertension: Secondary | ICD-10-CM

## 2020-01-04 NOTE — Patient Instructions (Signed)

## 2020-01-05 ENCOUNTER — Telehealth: Payer: Self-pay

## 2020-01-05 LAB — COMPREHENSIVE METABOLIC PANEL
ALT: 6 IU/L (ref 0–32)
AST: 12 IU/L (ref 0–40)
Albumin/Globulin Ratio: 1.7 (ref 1.2–2.2)
Albumin: 4.5 g/dL (ref 3.8–4.8)
Alkaline Phosphatase: 51 IU/L (ref 39–117)
BUN/Creatinine Ratio: 19 (ref 12–28)
BUN: 19 mg/dL (ref 8–27)
Bilirubin Total: 0.5 mg/dL (ref 0.0–1.2)
CO2: 20 mmol/L (ref 20–29)
Calcium: 10 mg/dL (ref 8.7–10.3)
Chloride: 105 mmol/L (ref 96–106)
Creatinine, Ser: 0.98 mg/dL (ref 0.57–1.00)
GFR calc Af Amer: 72 mL/min/{1.73_m2} (ref >59)
GFR calc non Af Amer: 62 mL/min/{1.73_m2} (ref >59)
Globulin, Total: 2.6 g/dL (ref 1.5–4.5)
Glucose: 90 mg/dL (ref 65–99)
Potassium: 4.3 mmol/L (ref 3.5–5.2)
Sodium: 142 mmol/L (ref 134–144)
Total Protein: 7.1 g/dL (ref 6.0–8.5)

## 2020-01-05 LAB — HEMOGLOBIN A1C
Est. average glucose Bld gHb Est-mCnc: 100 mg/dL
Hgb A1c MFr Bld: 5.1 % (ref 4.8–5.6)

## 2020-01-05 LAB — CBC WITH DIFFERENTIAL/PLATELET
Basophils Absolute: 0.1 10*3/uL (ref 0.0–0.2)
Basos: 1 %
EOS (ABSOLUTE): 0.1 10*3/uL (ref 0.0–0.4)
Eos: 2 %
Hematocrit: 32.6 % — ABNORMAL LOW (ref 34.0–46.6)
Hemoglobin: 11.3 g/dL (ref 11.1–15.9)
Immature Grans (Abs): 0 10*3/uL (ref 0.0–0.1)
Immature Granulocytes: 0 %
Lymphocytes Absolute: 2.4 10*3/uL (ref 0.7–3.1)
Lymphs: 30 %
MCH: 31.9 pg (ref 26.6–33.0)
MCHC: 34.7 g/dL (ref 31.5–35.7)
MCV: 92 fL (ref 79–97)
Monocytes Absolute: 0.6 10*3/uL (ref 0.1–0.9)
Monocytes: 7 %
Neutrophils Absolute: 4.9 10*3/uL (ref 1.4–7.0)
Neutrophils: 60 %
Platelets: 296 10*3/uL (ref 150–450)
RBC: 3.54 x10E6/uL — ABNORMAL LOW (ref 3.77–5.28)
RDW: 13.1 % (ref 11.7–15.4)
WBC: 8.1 10*3/uL (ref 3.4–10.8)

## 2020-01-05 LAB — LIPID PANEL WITH LDL/HDL RATIO
Cholesterol, Total: 163 mg/dL (ref 100–199)
HDL: 43 mg/dL (ref >39)
LDL Chol Calc (NIH): 94 mg/dL (ref 0–99)
LDL/HDL Ratio: 2.2 ratio (ref 0.0–3.2)
Triglycerides: 149 mg/dL (ref 0–149)
VLDL Cholesterol Cal: 26 mg/dL (ref 5–40)

## 2020-01-05 LAB — TSH: TSH: 0.952 u[IU]/mL (ref 0.450–4.500)

## 2020-01-05 NOTE — Telephone Encounter (Signed)
Patient advised on lab results. 

## 2020-01-16 ENCOUNTER — Other Ambulatory Visit: Payer: Self-pay | Admitting: Physician Assistant

## 2020-01-16 DIAGNOSIS — M545 Low back pain, unspecified: Secondary | ICD-10-CM

## 2020-01-16 DIAGNOSIS — G8929 Other chronic pain: Secondary | ICD-10-CM

## 2020-01-16 NOTE — Telephone Encounter (Signed)
Please advise 

## 2020-01-17 MED ORDER — HYDROCODONE-ACETAMINOPHEN 5-325 MG PO TABS: 1.0000 | ORAL_TABLET | ORAL | 0 refills | Status: DC | PRN

## 2020-01-22 ENCOUNTER — Other Ambulatory Visit: Payer: Self-pay | Admitting: Physician Assistant

## 2020-01-22 DIAGNOSIS — I10 Essential (primary) hypertension: Secondary | ICD-10-CM

## 2020-01-24 ENCOUNTER — Encounter: Payer: Self-pay | Admitting: *Deleted

## 2020-02-01 ENCOUNTER — Other Ambulatory Visit: Payer: Self-pay | Admitting: Physician Assistant

## 2020-02-01 DIAGNOSIS — I10 Essential (primary) hypertension: Secondary | ICD-10-CM

## 2020-02-04 ENCOUNTER — Ambulatory Visit: Payer: 59 | Attending: Internal Medicine

## 2020-02-04 DIAGNOSIS — Z23 Encounter for immunization: Secondary | ICD-10-CM | POA: Insufficient documentation

## 2020-02-04 NOTE — Progress Notes (Signed)
   Covid-19 Vaccination Clinic  Name:  RAEANNE DESCHLER    MRN: [id number] DOB: [birth year]  02/04/2020  Ms. Rackers was observed post Covid-19 immunization for 15 minutes without incident. She was provided with Vaccine Information Sheet and instruction to access the V-Safe system.   Ms. Ozbun was instructed to call 911 with any severe reactions post vaccine: Marland Kitchen Difficulty breathing  . Swelling of face and throat  . A fast heartbeat  . A bad rash all over body  . Dizziness and weakness   Immunizations Administered    Name Date Dose VIS Date Route   Pfizer COVID-19 Vaccine 02/04/2020  3:23 PM 0.3 mL 11/10/2019 Intramuscular   Manufacturer: [employer], [employer]   Lot: [id number]   NDC: [address]-7

## 2020-02-07 ENCOUNTER — Other Ambulatory Visit: Payer: Self-pay | Admitting: Physician Assistant

## 2020-02-07 DIAGNOSIS — G8929 Other chronic pain: Secondary | ICD-10-CM

## 2020-02-07 DIAGNOSIS — M545 Low back pain, unspecified: Secondary | ICD-10-CM

## 2020-02-08 MED ORDER — HYDROCODONE-ACETAMINOPHEN 5-325 MG PO TABS: 1.0000 | ORAL_TABLET | ORAL | 0 refills | Status: DC | PRN

## 2020-02-27 ENCOUNTER — Ambulatory Visit: Payer: 59 | Attending: Internal Medicine

## 2020-02-27 DIAGNOSIS — Z23 Encounter for immunization: Secondary | ICD-10-CM

## 2020-02-27 NOTE — Progress Notes (Signed)
   Covid-19 Vaccination Clinic  Name:  Priscilla Meza    MRN: [id number] DOB: 08-Dec-1955  02/27/2020  [name] was observed post Covid-19 immunization for 15 minutes without incident. She was provided with Vaccine Information Sheet and instruction to access the V-Safe system.   [name] was instructed to call 911 with any severe reactions post vaccine: Marland Kitchen Difficulty breathing  . Swelling of face and throat  . A fast heartbeat  . A bad rash all over body  . Dizziness and weakness   Immunizations Administered    Name Date Dose VIS Date Route   Pfizer COVID-19 Vaccine 02/27/2020  9:37 AM 0.3 mL 11/10/2019 Intramuscular   Manufacturer: [employer], [employer]   Lot: [id number]   NDC: [address]-5

## 2020-02-28 ENCOUNTER — Other Ambulatory Visit: Payer: Self-pay | Admitting: Physician Assistant

## 2020-02-28 DIAGNOSIS — G8929 Other chronic pain: Secondary | ICD-10-CM

## 2020-02-29 ENCOUNTER — Other Ambulatory Visit: Payer: Self-pay | Admitting: Physician Assistant

## 2020-02-29 DIAGNOSIS — K219 Gastro-esophageal reflux disease without esophagitis: Secondary | ICD-10-CM

## 2020-02-29 MED ORDER — HYDROCODONE-ACETAMINOPHEN 5-325 MG PO TABS: 1.0000 | ORAL_TABLET | ORAL | 0 refills | Status: DC | PRN

## 2020-03-21 ENCOUNTER — Other Ambulatory Visit: Payer: Self-pay | Admitting: Physician Assistant

## 2020-03-21 DIAGNOSIS — G8929 Other chronic pain: Secondary | ICD-10-CM

## 2020-03-22 MED ORDER — HYDROCODONE-ACETAMINOPHEN 5-325 MG PO TABS: 1.0000 | ORAL_TABLET | ORAL | 0 refills | Status: DC | PRN

## 2020-03-25 ENCOUNTER — Other Ambulatory Visit: Payer: Self-pay | Admitting: Physician Assistant

## 2020-03-25 DIAGNOSIS — E7800 Pure hypercholesterolemia, unspecified: Secondary | ICD-10-CM

## 2020-03-25 NOTE — Telephone Encounter (Signed)
Requested Prescriptions  Pending Prescriptions Disp Refills  . atorvastatin (LIPITOR) 20 MG tablet [Pharmacy Med Name: ATORVASTATIN 20 MG TABLET] 90 tablet 3    Sig: TAKE 1 TABLET BY MOUTH EVERYDAY AT BEDTIME     Cardiovascular:  Antilipid - Statins Failed - 03/25/2020  2:31 PM      Failed - LDL in normal range and within 360 days    LDL Chol Calc (NIH)  Date Value Ref Range Status  01/04/2020 94 0 - 99 mg/dL Final         Passed - Total Cholesterol in normal range and within 360 days    Cholesterol, Total  Date Value Ref Range Status  01/04/2020 163 100 - 199 mg/dL Final         Passed - HDL in normal range and within 360 days    HDL  Date Value Ref Range Status  01/04/2020 43 >39 mg/dL Final         Passed - Triglycerides in normal range and within 360 days    Triglycerides  Date Value Ref Range Status  01/04/2020 149 0 - 149 mg/dL Final         Passed - Patient is not pregnant      Passed - Valid encounter within last 12 months    Recent Outpatient Visits          2 months ago Annual physical exam   [facility] Joycelyn Man M, PA-C   9 months ago DDD (degenerative disc disease), lumbar   [facility] [place], Alessandra Bevels, [place]   1 year ago Annual physical exam   [facility] [place], Alessandra Bevels, [place]   2 years ago Annual physical exam   [facility] [place], Alessandra Bevels, [place]   3 years ago Annual physical exam   [facility] [place], Alessandra Bevels, [place]      Future Appointments            In 3 months Burnette, Alessandra Bevels, PA-C [facility]

## 2020-04-14 ENCOUNTER — Other Ambulatory Visit: Payer: Self-pay | Admitting: Physician Assistant

## 2020-04-14 DIAGNOSIS — G8929 Other chronic pain: Secondary | ICD-10-CM

## 2020-04-16 NOTE — Telephone Encounter (Signed)
Please advise 

## 2020-04-16 NOTE — Telephone Encounter (Signed)
Pt following up on her refill request  HYDROcodone-acetaminophen (NORCO/VICODIN) 5-325 MG tablet  Pt states this is a 25 day supply. Victorino Dike is out this week and pt tates she needs Rx,=.

## 2020-04-17 ENCOUNTER — Other Ambulatory Visit: Payer: Self-pay | Admitting: Physician Assistant

## 2020-04-17 DIAGNOSIS — M545 Low back pain, unspecified: Secondary | ICD-10-CM

## 2020-04-17 DIAGNOSIS — G8929 Other chronic pain: Secondary | ICD-10-CM

## 2020-04-17 NOTE — Telephone Encounter (Signed)
PT need a refill HYDROcodone-acetaminophen (NORCO/VICODIN) 5-325 MG tablet [[id number]]   [id number] - [place], [place] - [address]

## 2020-04-17 NOTE — Addendum Note (Signed)
Addended by: Amado Coe on: 04/17/2020 03:03 PM   Modules accepted: Orders

## 2020-04-18 NOTE — Telephone Encounter (Signed)
Patient advised as below. Patient reports she will talk to Janeann Forehand on Monday. Patient is upset that mediation was not refilled.

## 2020-04-18 NOTE — Telephone Encounter (Signed)
LMTCB

## 2020-04-18 NOTE — Telephone Encounter (Signed)
I see that the pharmacy deems it as 25 day, but that is just based on number of pills.  We do not give 25 day refills, but 30 days.  Will not fill early at this time.  Can discuss with PCP on Monday when she is back in the office.  Will send FYI to PCP as well.

## 2020-04-18 NOTE — Telephone Encounter (Signed)
Patient is consistently filling early.  Next refill not due until 04/24/20

## 2020-04-18 NOTE — Telephone Encounter (Signed)
LOV  Date  01/04/20  LRF  Date       03/22/20     #      150           RF  Last labs  Date

## 2020-04-18 NOTE — Telephone Encounter (Signed)
Patient advised as below. Patient reports that her medication is a 25 day supply not 30 day. Patient request her medication to be refilled. Please advise.

## 2020-04-22 ENCOUNTER — Other Ambulatory Visit: Payer: Self-pay | Admitting: Physician Assistant

## 2020-04-22 DIAGNOSIS — I10 Essential (primary) hypertension: Secondary | ICD-10-CM

## 2020-04-22 DIAGNOSIS — M545 Low back pain, unspecified: Secondary | ICD-10-CM

## 2020-04-22 MED ORDER — HYDROCODONE-ACETAMINOPHEN 5-325 MG PO TABS: 1.0000 | ORAL_TABLET | ORAL | 0 refills | Status: DC | PRN

## 2020-04-22 NOTE — Telephone Encounter (Signed)
Requested Prescriptions  Pending Prescriptions Disp Refills  . lisinopril (ZESTRIL) 20 MG tablet [Pharmacy Med Name: LISINOPRIL 20 MG TABLET] 90 tablet 0    Sig: TAKE 1 TABLET BY MOUTH EVERY DAY     Cardiovascular:  ACE Inhibitors Failed - 04/22/2020 10:31 AM      Failed - Valid encounter within last 6 months    Recent Outpatient Visits          3 months ago Annual physical exam   [facility] Joycelyn Man M, PA-C   10 months ago DDD (degenerative disc disease), lumbar   [facility] [place], Alessandra Bevels, [place]   1 year ago Annual physical exam   [facility] [place], Alessandra Bevels, [place]   2 years ago Annual physical exam   [facility] Joycelyn Man M, [place]   3 years ago Annual physical exam   [facility] [place], Alessandra Bevels, [place]      Future Appointments            In 2 months Burnette, Alessandra Bevels, PA-C [facility]           Passed - Cr in normal range and within 180 days    Creatinine  Date Value Ref Range Status  12/02/2014 0.97 0.60 - 1.30 mg/dL Final   Creatinine, Ser  Date Value Ref Range Status  01/04/2020 0.98 0.57 - 1.00 mg/dL Final         Passed - K in normal range and within 180 days    Potassium  Date Value Ref Range Status  01/04/2020 4.3 3.5 - 5.2 mmol/L Final  12/02/2014 4.1 3.5 - 5.1 mmol/L Final         Passed - Patient is not pregnant      Passed - Last BP in normal range    BP Readings from Last 1 Encounters:  01/04/20 118/70

## 2020-05-13 ENCOUNTER — Other Ambulatory Visit: Payer: Self-pay | Admitting: Physician Assistant

## 2020-05-13 DIAGNOSIS — G8929 Other chronic pain: Secondary | ICD-10-CM

## 2020-05-13 MED ORDER — HYDROCODONE-ACETAMINOPHEN 5-325 MG PO TABS: 1.0000 | ORAL_TABLET | ORAL | 0 refills | Status: DC | PRN

## 2020-06-05 ENCOUNTER — Other Ambulatory Visit: Payer: Self-pay | Admitting: Physician Assistant

## 2020-06-05 DIAGNOSIS — M545 Low back pain, unspecified: Secondary | ICD-10-CM

## 2020-06-05 MED ORDER — HYDROCODONE-ACETAMINOPHEN 5-325 MG PO TABS: 1.0000 | ORAL_TABLET | ORAL | 0 refills | Status: DC | PRN

## 2020-06-26 ENCOUNTER — Other Ambulatory Visit: Payer: Self-pay | Admitting: Physician Assistant

## 2020-06-26 DIAGNOSIS — G8929 Other chronic pain: Secondary | ICD-10-CM

## 2020-06-27 MED ORDER — HYDROCODONE-ACETAMINOPHEN 5-325 MG PO TABS: 1.0000 | ORAL_TABLET | ORAL | 0 refills | Status: DC | PRN

## 2020-07-04 ENCOUNTER — Telehealth: Payer: Self-pay | Admitting: Physician Assistant

## 2020-07-08 ENCOUNTER — Telehealth (INDEPENDENT_AMBULATORY_CARE_PROVIDER_SITE_OTHER): Payer: 59 | Admitting: Physician Assistant

## 2020-07-08 ENCOUNTER — Encounter: Payer: Self-pay | Admitting: Physician Assistant

## 2020-07-08 VITALS — BP 144/73

## 2020-07-08 DIAGNOSIS — E7800 Pure hypercholesterolemia, unspecified: Secondary | ICD-10-CM | POA: Diagnosis not present

## 2020-07-08 DIAGNOSIS — G8929 Other chronic pain: Secondary | ICD-10-CM | POA: Diagnosis not present

## 2020-07-08 DIAGNOSIS — I10 Essential (primary) hypertension: Secondary | ICD-10-CM | POA: Diagnosis not present

## 2020-07-08 DIAGNOSIS — F1190 Opioid use, unspecified, uncomplicated: Secondary | ICD-10-CM | POA: Diagnosis not present

## 2020-07-08 DIAGNOSIS — M5441 Lumbago with sciatica, right side: Secondary | ICD-10-CM

## 2020-07-08 DIAGNOSIS — M5442 Lumbago with sciatica, left side: Secondary | ICD-10-CM

## 2020-07-08 DIAGNOSIS — R69 Illness, unspecified: Secondary | ICD-10-CM | POA: Diagnosis not present

## 2020-07-08 NOTE — Progress Notes (Signed)
MyChart Video Visit    Virtual Visit via Video Note   This visit type was conducted due to national recommendations for restrictions regarding the COVID-19 Pandemic (e.g. social distancing) in an effort to limit this patient's exposure and mitigate transmission in our community. This patient is at least at moderate risk for complications without adequate follow up. This format is felt to be most appropriate for this patient at this time. Physical exam was limited by quality of the video and audio technology used for the visit.   I connected with Priscilla Meza on 07/08/20 at 10:40 AM EDT by a video enabled telemedicine application and verified that I am speaking with the correct person using two identifiers.  I discussed the limitations of evaluation and management by telemedicine and the availability of in person appointments. The patient expressed understanding and agreed to proceed.  Margaretann Loveless, PA-C   Patient location: Home Provider location: [facility]   Patient: Priscilla Meza   DOB: [birth year]   64 y.o. Female  MRN: [id number] Visit Date: 07/08/2020  Today's healthcare provider: Margaretann Loveless, PA-C   No chief complaint on file.  Subjective    HPI: Hypertension, follow-up  BP Readings from Last 3 Encounters:  07/08/20 (!) 144/73  01/04/20 118/70  06/07/19 (!) 150/81   Wt Readings from Last 3 Encounters:  01/04/20 137 lb (62.1 kg)  06/07/19 150 lb (68 kg)  12/06/18 149 lb (67.6 kg)     She was last seen for hypertension 6 months ago.  BP at that visit was 150/81. Management since that visit includes none.  She reports excellent compliance with treatment. She is not having side effects.  She is following a Keto diet. She is not exercising. She does not smoke.  Use of agents associated with hypertension: none.   Outside blood pressures are not being checked regularly. Symptoms: No chest pain No chest pressure  No palpitations No syncope  No dyspnea  No orthopnea  No paroxysmal nocturnal dyspnea No lower extremity edema   Pertinent labs: Lab Results  Component Value Date   CHOL 163 01/04/2020   HDL 43 01/04/2020   LDLCALC 94 01/04/2020   TRIG 149 01/04/2020   CHOLHDL 4.0 12/16/2018   Lab Results  Component Value Date   NA 142 01/04/2020   K 4.3 01/04/2020   CREATININE 0.98 01/04/2020   GFRNONAA 62 01/04/2020   GFRAA 72 01/04/2020   GLUCOSE 90 01/04/2020     The 10-year ASCVD risk score Denman George DC Jr., et al., 2013) is: 6.2%   --------------------------------------------------------------------------------------------------- Lipid/Cholesterol, Follow-up  Last lipid panel Other pertinent labs  Lab Results  Component Value Date   CHOL 163 01/04/2020   HDL 43 01/04/2020   LDLCALC 94 01/04/2020   TRIG 149 01/04/2020   CHOLHDL 4.0 12/16/2018   Lab Results  Component Value Date   ALT 6 01/04/2020   AST 12 01/04/2020   PLT 296 01/04/2020   TSH 0.952 01/04/2020     She was last seen for this 6 months ago.  Management since that visit includes none.  She reports excellent compliance with treatment. She is not having side effects.   Symptoms: No chest pain No chest pressure/discomfort  No dyspnea No lower extremity edema  No numbness or tingling of extremity No orthopnea  No palpitations No paroxysmal nocturnal dyspnea  No speech difficulty No syncope   Current diet: Keto Current exercise: none  The 10-year ASCVD risk score Denman George DC Jr.,  et al., 2013) is: 6.2%  ---------------------------------------------------------------------------------------------------   Patient Active Problem List   Diagnosis Date Noted  . Chronic narcotic use 06/07/2019  . Family history of colon cancer 12/06/2018  . Anemia 05/30/2015  . Acid reflux 05/30/2015  . Hypertension 05/30/2015  . Hypercholesteremia 05/30/2015  . Urinary incontinence 05/30/2015  . Chronic low back pain 05/30/2015  . DDD (degenerative disc disease),  lumbar 05/30/2015  . Systolic murmur 05/30/2015  . Gastrointestinal bleeding, upper 01/05/2015   Past Medical History:  Diagnosis Date  . Frequent headaches   . GERD (gastroesophageal reflux disease)   . Hypertension   . Urine incontinence    No Known Allergies    Medications: Outpatient Medications Prior to Visit  Medication Sig  . atorvastatin (LIPITOR) 20 MG tablet TAKE 1 TABLET BY MOUTH EVERYDAY AT BEDTIME  . hydrochlorothiazide (HYDRODIURIL) 25 MG tablet TAKE 1 TABLET BY MOUTH EVERY DAY  . HYDROcodone-acetaminophen (NORCO/VICODIN) 5-325 MG tablet Take 1 tablet by mouth every 4 (four) hours as needed for moderate pain.  Marland Kitchen lisinopril (ZESTRIL) 20 MG tablet TAKE 1 TABLET BY MOUTH EVERY DAY  . pantoprazole (PROTONIX) 40 MG tablet TAKE 1 TABLET BY MOUTH EVERY DAY   No facility-administered medications prior to visit.    Review of Systems  Constitutional: Negative.   Respiratory: Negative.   Cardiovascular: Negative.   Neurological: Negative.     Last metabolic panel Lab Results  Component Value Date   GLUCOSE 90 01/04/2020   NA 142 01/04/2020   K 4.3 01/04/2020   CL 105 01/04/2020   CO2 20 01/04/2020   BUN 19 01/04/2020   CREATININE 0.98 01/04/2020   GFRNONAA 62 01/04/2020   GFRAA 72 01/04/2020   CALCIUM 10.0 01/04/2020   PROT 7.1 01/04/2020   ALBUMIN 4.5 01/04/2020   LABGLOB 2.6 01/04/2020   AGRATIO 1.7 01/04/2020   BILITOT 0.5 01/04/2020   ALKPHOS 51 01/04/2020   AST 12 01/04/2020   ALT 6 01/04/2020   ANIONGAP 6 (L) 12/02/2014   Last lipids Lab Results  Component Value Date   CHOL 163 01/04/2020   HDL 43 01/04/2020   LDLCALC 94 01/04/2020   TRIG 149 01/04/2020   CHOLHDL 4.0 12/16/2018      Objective    BP (!) 144/73 (BP Location: Left Arm, Patient Position: Sitting, Cuff Size: Normal)    Physical Exam Vitals reviewed.  Constitutional:      Appearance: Normal appearance. She is well-developed.  HENT:     Head: Normocephalic and atraumatic.    Pulmonary:     Effort: Pulmonary effort is normal. No respiratory distress.  Musculoskeletal:     Cervical back: Normal range of motion and neck supple.  Neurological:     Mental Status: She is alert.  Psychiatric:        Mood and Affect: Mood normal.        Behavior: Behavior normal.        Thought Content: Thought content normal.        Judgment: Judgment normal.        Assessment & Plan     1. Essential hypertension Stable. Continue Lisinopril 20mg  and HCTZ 25mg .   2. Chronic narcotic use NCCSR reviewed. No previous red flag usage or refills. Continue Norco 5-235mg  as prescribed. Will need urine drug screen at next in office visit.   3. Chronic bilateral low back pain with bilateral sciatica See above medical treatment plan.  4. Hypercholesterolemia Stable. Continue Atorvastatin 20mg . Will check labs as below  and f/u pending results.   No follow-ups on file.     I discussed the assessment and treatment plan with the patient. The patient was provided an opportunity to ask questions and all were answered. The patient agreed with the plan and demonstrated an understanding of the instructions.   The patient was advised to call back or seek an in-person evaluation if the symptoms worsen or if the condition fails to improve as anticipated.  I provided 12 minutes of non-face-to-face time during this encounter.  Delmer Islam, PA-C, have reviewed all documentation for this visit. The documentation on 07/21/20 for the exam, diagnosis, procedures, and orders are all accurate and complete.  Reine Just [facility] [phone] (phone) [phone] (fax)  [facility]

## 2020-07-08 NOTE — Patient Instructions (Signed)

## 2020-07-18 ENCOUNTER — Other Ambulatory Visit: Payer: Self-pay | Admitting: Physician Assistant

## 2020-07-18 DIAGNOSIS — G8929 Other chronic pain: Secondary | ICD-10-CM

## 2020-07-18 MED ORDER — HYDROCODONE-ACETAMINOPHEN 5-325 MG PO TABS: 1.0000 | ORAL_TABLET | ORAL | 0 refills | Status: DC | PRN

## 2020-07-27 ENCOUNTER — Other Ambulatory Visit: Payer: Self-pay | Admitting: Physician Assistant

## 2020-07-27 DIAGNOSIS — I10 Essential (primary) hypertension: Secondary | ICD-10-CM

## 2020-07-27 NOTE — Telephone Encounter (Signed)
Requested Prescriptions  Pending Prescriptions Disp Refills  . lisinopril (ZESTRIL) 20 MG tablet [Pharmacy Med Name: LISINOPRIL 20 MG TABLET] 90 tablet 1    Sig: TAKE 1 TABLET BY MOUTH EVERY DAY     Cardiovascular:  ACE Inhibitors Failed - 07/27/2020  8:53 AM      Failed - Cr in normal range and within 180 days    Creatinine  Date Value Ref Range Status  12/02/2014 0.97 0.60 - 1.30 mg/dL Final   Creatinine, Ser  Date Value Ref Range Status  01/04/2020 0.98 0.57 - 1.00 mg/dL Final         Failed - K in normal range and within 180 days    Potassium  Date Value Ref Range Status  01/04/2020 4.3 3.5 - 5.2 mmol/L Final  12/02/2014 4.1 3.5 - 5.1 mmol/L Final         Failed - Last BP in normal range    BP Readings from Last 1 Encounters:  07/08/20 (!) 144/73         Passed - Patient is not pregnant      Passed - Valid encounter within last 6 months    Recent Outpatient Visits          2 weeks ago Essential hypertension   [facility] [place], Alessandra Bevels, [place]   6 months ago Annual physical exam   [facility] [place], Alessandra Bevels, [place]   1 year ago DDD (degenerative disc disease), lumbar   [facility] [place], Alessandra Bevels, [place]   1 year ago Annual physical exam   [facility] [place], Alessandra Bevels, [place]   2 years ago Annual physical exam   [facility] [place], [place], [place]

## 2020-07-29 ENCOUNTER — Other Ambulatory Visit: Payer: Self-pay | Admitting: Physician Assistant

## 2020-07-29 DIAGNOSIS — I10 Essential (primary) hypertension: Secondary | ICD-10-CM

## 2020-08-13 ENCOUNTER — Other Ambulatory Visit: Payer: Self-pay | Admitting: Physician Assistant

## 2020-08-13 DIAGNOSIS — G8929 Other chronic pain: Secondary | ICD-10-CM

## 2020-08-13 MED ORDER — HYDROCODONE-ACETAMINOPHEN 5-325 MG PO TABS: 1.0000 | ORAL_TABLET | ORAL | 0 refills | Status: DC | PRN

## 2020-09-09 ENCOUNTER — Other Ambulatory Visit: Payer: Self-pay | Admitting: Physician Assistant

## 2020-09-09 DIAGNOSIS — G8929 Other chronic pain: Secondary | ICD-10-CM

## 2020-09-09 MED ORDER — HYDROCODONE-ACETAMINOPHEN 5-325 MG PO TABS: 1.0000 | ORAL_TABLET | ORAL | 0 refills | Status: DC | PRN

## 2020-10-01 ENCOUNTER — Other Ambulatory Visit: Payer: Self-pay | Admitting: Physician Assistant

## 2020-10-01 DIAGNOSIS — M545 Low back pain, unspecified: Secondary | ICD-10-CM

## 2020-10-01 MED ORDER — HYDROCODONE-ACETAMINOPHEN 5-325 MG PO TABS: 1.0000 | ORAL_TABLET | ORAL | 0 refills | Status: DC | PRN

## 2020-10-22 ENCOUNTER — Other Ambulatory Visit: Payer: Self-pay | Admitting: Physician Assistant

## 2020-10-22 DIAGNOSIS — G8929 Other chronic pain: Secondary | ICD-10-CM

## 2020-10-22 DIAGNOSIS — M545 Low back pain, unspecified: Secondary | ICD-10-CM

## 2020-10-22 MED ORDER — HYDROCODONE-ACETAMINOPHEN 5-325 MG PO TABS: 1.0000 | ORAL_TABLET | ORAL | 0 refills | Status: DC | PRN

## 2020-11-17 ENCOUNTER — Other Ambulatory Visit: Payer: Self-pay | Admitting: Physician Assistant

## 2020-11-17 DIAGNOSIS — G8929 Other chronic pain: Secondary | ICD-10-CM

## 2020-11-18 MED ORDER — HYDROCODONE-ACETAMINOPHEN 5-325 MG PO TABS: 1.0000 | ORAL_TABLET | ORAL | 0 refills | Status: DC | PRN

## 2020-12-11 ENCOUNTER — Other Ambulatory Visit: Payer: Self-pay | Admitting: Physician Assistant

## 2020-12-11 DIAGNOSIS — G8929 Other chronic pain: Secondary | ICD-10-CM

## 2020-12-11 DIAGNOSIS — M545 Low back pain, unspecified: Secondary | ICD-10-CM

## 2020-12-12 MED ORDER — HYDROCODONE-ACETAMINOPHEN 5-325 MG PO TABS: 1.0000 | ORAL_TABLET | ORAL | 0 refills | Status: DC | PRN

## 2020-12-13 ENCOUNTER — Telehealth: Payer: Self-pay

## 2020-12-13 DIAGNOSIS — M545 Low back pain, unspecified: Secondary | ICD-10-CM

## 2020-12-13 DIAGNOSIS — G8929 Other chronic pain: Secondary | ICD-10-CM

## 2020-12-13 MED ORDER — HYDROCODONE-ACETAMINOPHEN 5-325 MG PO TABS: 1.0000 | ORAL_TABLET | ORAL | 0 refills | Status: DC | PRN

## 2020-12-13 NOTE — Telephone Encounter (Signed)
Resent

## 2020-12-13 NOTE — Telephone Encounter (Signed)
Copied from CRM [id number]. Topic: General - Other >> Dec 13, 2020 10:13 AM Marylen Ponto wrote: Reason for CRM: Pt stated her pharmacy did not receive the Rx for HYDROcodone-acetaminophen (NORCO/VICODIN) 5-325 MG tablet. Pt requests that Rx be sent asap to [id number] - [place], [place] - [address]

## 2021-01-05 ENCOUNTER — Other Ambulatory Visit: Payer: Self-pay | Admitting: Physician Assistant

## 2021-01-05 DIAGNOSIS — G8929 Other chronic pain: Secondary | ICD-10-CM

## 2021-01-06 MED ORDER — HYDROCODONE-ACETAMINOPHEN 5-325 MG PO TABS: 1.0000 | ORAL_TABLET | ORAL | 0 refills | Status: DC | PRN

## 2021-01-18 ENCOUNTER — Other Ambulatory Visit: Payer: Self-pay | Admitting: Physician Assistant

## 2021-01-18 DIAGNOSIS — I10 Essential (primary) hypertension: Secondary | ICD-10-CM

## 2021-01-19 NOTE — Telephone Encounter (Signed)
Requested medication (s) are due for refill today: Yes  Requested medication (s) are on the active medication list: Yes  Last refill:    Future visit scheduled: No  Notes to clinic:  Pt. States she will schedule appointment through My Chart.    Requested Prescriptions  Pending Prescriptions Disp Refills   hydrochlorothiazide (HYDRODIURIL) 25 MG tablet [Pharmacy Med Name: HYDROCHLOROTHIAZIDE 25 MG TAB] 30 tablet 5    Sig: TAKE 1 TABLET BY MOUTH EVERY DAY      Cardiovascular: Diuretics - Thiazide Failed - 01/18/2021  3:28 PM      Failed - Ca in normal range and within 360 days    Calcium  Date Value Ref Range Status  01/04/2020 10.0 8.7 - 10.3 mg/dL Final   Calcium, Total  Date Value Ref Range Status  12/02/2014 7.6 (L) 8.5 - 10.1 mg/dL Final          Failed - Cr in normal range and within 360 days    Creatinine  Date Value Ref Range Status  12/02/2014 0.97 0.60 - 1.30 mg/dL Final   Creatinine, Ser  Date Value Ref Range Status  01/04/2020 0.98 0.57 - 1.00 mg/dL Final          Failed - K in normal range and within 360 days    Potassium  Date Value Ref Range Status  01/04/2020 4.3 3.5 - 5.2 mmol/L Final  12/02/2014 4.1 3.5 - 5.1 mmol/L Final          Failed - Na in normal range and within 360 days    Sodium  Date Value Ref Range Status  01/04/2020 142 134 - 144 mmol/L Final  12/02/2014 140 136 - 145 mmol/L Final          Failed - Last BP in normal range    BP Readings from Last 1 Encounters:  07/08/20 (!) 144/73          Failed - Valid encounter within last 6 months    Recent Outpatient Visits           6 months ago Essential hypertension   [facility] [place], Alessandra Be[place], [place]   1 year ago Annual physical exam   [facility] [place], Alessandra Bevels, PA-C   1 year ago DDD (degenerative disc disease), lumbar   [facility] [place], [place]andra Be[place], [place]   2 years ago Annual physical exam   [facility] Joycelyn Man M, [place]   3 years ago Annual physical exam   [facility] Joycelyn Man M, PA-C                  lisinopril (ZESTRIL) 20 MG tablet [Pharmacy Med Name: LISINOPRIL 20 MG TABLET] 30 tablet 5    Sig: TAKE 1 TABLET BY MOUTH EVERY DAY      Cardiovascular:  ACE Inhibitors Failed - 01/18/2021  3:28 PM      Failed - Cr in normal range and within 180 days    Creatinine  Date Value Ref Range Status  12/02/2014 0.97 0.60 - 1.30 mg/dL Final   Creatinine, Ser  Date Value Ref Range Status  01/04/2020 0.98 0.57 - 1.00 mg/dL Final          Failed - K in normal range and within 180 days    Potassium  Date Value Ref Range Status  01/04/2020 4.3 3.5 - 5.2 mmol/L Final  12/02/2014 4.1 3.5 - 5.1 mmol/L Final  Failed - Last BP in normal range    BP Readings from Last 1 Encounters:  07/08/20 (!) 144/73          Failed - Valid encounter within last 6 months    Recent Outpatient Visits           6 months ago Essential hypertension   [facility] [place], Alessandra Bevels, [place]   1 year ago Annual physical exam   [facility] [place], Alessandra Be[place], [place]   1 year ago DDD (degenerative disc disease), lumbar   [facility] [place], Alessandra Be[place]s, [place]   2 years ago Annual physical exam   [facility] [place], Alessandra Bevels, [place]   3 years ago Annual physical exam   [facility] Joycelyn Man M, [place]                Passed - Patient is not pregnant

## 2021-01-27 ENCOUNTER — Ambulatory Visit (INDEPENDENT_AMBULATORY_CARE_PROVIDER_SITE_OTHER): Payer: 59 | Admitting: Physician Assistant

## 2021-01-27 ENCOUNTER — Encounter: Payer: Self-pay | Admitting: Physician Assistant

## 2021-01-27 ENCOUNTER — Other Ambulatory Visit: Payer: Self-pay

## 2021-01-27 VITALS — BP 145/78 | HR 60 | Temp 98.0°F | Ht 61.5 in | Wt 142.0 lb

## 2021-01-27 DIAGNOSIS — Z1231 Encounter for screening mammogram for malignant neoplasm of breast: Secondary | ICD-10-CM

## 2021-01-27 DIAGNOSIS — K219 Gastro-esophageal reflux disease without esophagitis: Secondary | ICD-10-CM | POA: Diagnosis not present

## 2021-01-27 DIAGNOSIS — M5442 Lumbago with sciatica, left side: Secondary | ICD-10-CM

## 2021-01-27 DIAGNOSIS — Z1211 Encounter for screening for malignant neoplasm of colon: Secondary | ICD-10-CM

## 2021-01-27 DIAGNOSIS — E7800 Pure hypercholesterolemia, unspecified: Secondary | ICD-10-CM

## 2021-01-27 DIAGNOSIS — Z800 Family history of malignant neoplasm of digestive organs: Secondary | ICD-10-CM | POA: Diagnosis not present

## 2021-01-27 DIAGNOSIS — I10 Essential (primary) hypertension: Secondary | ICD-10-CM

## 2021-01-27 DIAGNOSIS — M5441 Lumbago with sciatica, right side: Secondary | ICD-10-CM | POA: Diagnosis not present

## 2021-01-27 DIAGNOSIS — G8929 Other chronic pain: Secondary | ICD-10-CM

## 2021-01-27 DIAGNOSIS — Z6826 Body mass index (BMI) 26.0-26.9, adult: Secondary | ICD-10-CM

## 2021-01-27 DIAGNOSIS — Z0000 Encounter for general adult medical examination without abnormal findings: Secondary | ICD-10-CM

## 2021-01-27 MED ORDER — HYDROCHLOROTHIAZIDE 25 MG PO TABS: 25.0000 mg | ORAL_TABLET | Freq: Every day | ORAL | 3 refills | Status: DC

## 2021-01-27 MED ORDER — PANTOPRAZOLE SODIUM 40 MG PO TBEC: 40.0000 mg | DELAYED_RELEASE_TABLET | Freq: Every day | ORAL | 3 refills | Status: DC

## 2021-01-27 MED ORDER — LISINOPRIL 20 MG PO TABS: 20.0000 mg | ORAL_TABLET | Freq: Every day | ORAL | 3 refills | Status: DC

## 2021-01-27 MED ORDER — ATORVASTATIN CALCIUM 20 MG PO TABS
ORAL_TABLET | ORAL | 3 refills | Status: DC
Start: 2021-01-27 — End: 2021-12-09

## 2021-01-27 MED ORDER — HYDROCODONE-ACETAMINOPHEN 5-325 MG PO TABS: 1.0000 | ORAL_TABLET | ORAL | 0 refills | Status: DC | PRN

## 2021-01-27 NOTE — Progress Notes (Signed)
Complete physical exam   Patient: Priscilla Meza   DOB: 1956-06-11   65 y.o. Female  MRN: [id number] Visit Date: 01/27/2021  Today's healthcare provider: Margaretann Loveless, PA-C   Chief Complaint  Patient presents with  . Annual Exam   Subjective    Priscilla Meza is a 65 y.o. female who presents today for a complete physical exam.  She reports consuming a general diet. The patient does not participate in regular exercise at present. She generally feels fairly well. She reports sleeping well. She does not have additional problems to discuss today.  HPI    Past Medical History:  Diagnosis Date  . Frequent headaches   . GERD (gastroesophageal reflux disease)   . Hypertension   . Urine incontinence    Past Surgical History:  Procedure Laterality Date  . ABDOMINAL HYSTERECTOMY  1985  . TUBAL LIGATION     Social History   Socioeconomic History  . Marital status: Widowed    Spouse name: Not on file  . Number of children: Not on file  . Years of education: Not on file  . Highest education level: Not on file  Occupational History  . Not on file  Tobacco Use  . Smoking status: Former Games developer  . Smokeless tobacco: Never Used  . Tobacco comment: quit in 12/2013  Vaping Use  . Vaping Use: Every day  Substance and Sexual Activity  . Alcohol use: No    Alcohol/week: 0.0 standard drinks  . Drug use: No  . Sexual activity: Not on file  Other Topics Concern  . Not on file  Social History Narrative  . Not on file   Social Determinants of Health   Financial Resource Strain: Not on file  Food Insecurity: Not on file  Transportation Needs: Not on file  Physical Activity: Not on file  Stress: Not on file  Social Connections: Not on file  Intimate Partner Violence: Not on file   Family Status  Relation Name Status  . Mat Aunt  Deceased  . MGM  Deceased   Family History  Problem Relation Age of Onset  . Breast cancer Maternal Aunt   . Colon cancer Maternal  Grandmother    No Known Allergies  Patient Care Team: Reine Just as PCP - General (Physician Assistant)   Medications: Outpatient Medications Prior to Visit  Medication Sig  . atorvastatin (LIPITOR) 20 MG tablet TAKE 1 TABLET BY MOUTH EVERYDAY AT BEDTIME  . hydrochlorothiazide (HYDRODIURIL) 25 MG tablet TAKE 1 TABLET BY MOUTH EVERY DAY  . HYDROcodone-acetaminophen (NORCO/VICODIN) 5-325 MG tablet Take 1 tablet by mouth every 4 (four) hours as needed for moderate pain.  Marland Kitchen lisinopril (ZESTRIL) 20 MG tablet TAKE 1 TABLET BY MOUTH EVERY DAY  . pantoprazole (PROTONIX) 40 MG tablet TAKE 1 TABLET BY MOUTH EVERY DAY   No facility-administered medications prior to visit.    Review of Systems  Constitutional: Negative.   HENT: Negative.   Eyes: Negative.   Respiratory: Negative.   Cardiovascular: Negative.   Gastrointestinal: Negative.   Endocrine: Negative.   Genitourinary: Negative.   Musculoskeletal: Negative.   Skin: Negative.   Allergic/Immunologic: Negative.   Neurological: Negative.   Hematological: Negative.   Psychiatric/Behavioral: Negative.       Objective    BP (!) 145/78 (BP Location: Right Arm, Patient Position: Sitting, Cuff Size: Large)   Pulse 60   Temp 98 F (36.7 C) (Oral)   Ht 5' 1.5" (1.562  m)   Wt 142 lb (64.4 kg)   BMI 26.40 kg/m    Physical Exam Vitals reviewed.  Constitutional:      General: She is not in acute distress.    Appearance: Normal appearance. She is well-developed, well-groomed, overweight and well-nourished. She is not ill-appearing or diaphoretic.  HENT:     Head: Normocephalic and atraumatic.     Right Ear: Tympanic membrane, ear canal and external ear normal.     Left Ear: Tympanic membrane, ear canal and external ear normal.     Mouth/Throat:     Mouth: Oropharynx is clear and moist.  Eyes:     General: No scleral icterus.       Right eye: No discharge.        Left eye: No discharge.     Extraocular  Movements: Extraocular movements intact and EOM normal.     Conjunctiva/sclera: Conjunctivae normal.     Pupils: Pupils are equal, round, and reactive to light.  Neck:     Thyroid: No thyromegaly.     Vascular: No carotid bruit or JVD.     Trachea: No tracheal deviation.  Cardiovascular:     Rate and Rhythm: Normal rate and regular rhythm.     Pulses: Normal pulses and intact distal pulses.     Heart sounds: Normal heart sounds. No murmur heard. No friction rub. No gallop.   Pulmonary:     Effort: Pulmonary effort is normal. No respiratory distress.     Breath sounds: Normal breath sounds. No wheezing or rales.  Chest:     Chest wall: No tenderness.  Abdominal:     General: Abdomen is flat. Bowel sounds are normal. There is no distension.     Palpations: Abdomen is soft. There is no mass.     Tenderness: There is no abdominal tenderness. There is no guarding or rebound.  Musculoskeletal:        General: No tenderness or edema. Normal range of motion.     Cervical back: Normal range of motion and neck supple. No tenderness.     Right lower leg: No edema.     Left lower leg: No edema.  Lymphadenopathy:     Cervical: No cervical adenopathy.  Skin:    General: Skin is warm and dry.     Capillary Refill: Capillary refill takes less than 2 seconds.     Findings: No rash.  Neurological:     General: No focal deficit present.     Mental Status: She is alert and oriented to person, place, and time. Mental status is at baseline.  Psychiatric:        Mood and Affect: Mood and affect and mood normal.        Behavior: Behavior normal. Behavior is cooperative.        Thought Content: Thought content normal.        Judgment: Judgment normal.      Last depression screening scores PHQ 2/9 Scores 01/27/2021 01/04/2020 12/06/2018  PHQ - 2 Score 0 0 0  PHQ- 9 Score 0 1 0   Last fall risk screening Fall Risk  01/27/2021  Falls in the past year? 1  Number falls in past yr: 0  Injury with  Fall? 0  Risk for fall due to : No Fall Risks  Follow up Falls evaluation completed   Last Audit-C alcohol use screening Alcohol Use Disorder Test (AUDIT) 01/27/2021  1. How often do you have a drink containing alcohol?  0  2. How many drinks containing alcohol do you have on a typical day when you are drinking? 0  3. How often do you have six or more drinks on one occasion? 0  AUDIT-C Score 0  Alcohol Brief Interventions/Follow-up AUDIT Score <7 follow-up not indicated   A score of 3 or more in women, and 4 or more in men indicates increased risk for alcohol abuse, EXCEPT if all of the points are from question 1   No results found for any visits on 01/27/21.  Assessment & Plan    Routine Health Maintenance and Physical Exam  Exercise Activities and Dietary recommendations Goals   None     Immunization History  Administered Date(s) Administered  . Influenza-Unspecified 08/12/2018, 09/06/2019, 09/17/2020  . PFIZER(Purple Top)SARS-COV-2 Vaccination 02/04/2020, 02/27/2020, 09/17/2020  . Rabies, IM 05/27/2018, 06/04/2018, 06/11/2018, 06/18/2018    Health Maintenance  Topic Date Due  . TETANUS/TDAP  Never done  . COLONOSCOPY (Pts 45-105yrs Insurance coverage will need to be confirmed)  05/23/2019  . MAMMOGRAM  12/31/2021  . INFLUENZA VACCINE  Completed  . COVID-19 Vaccine  Completed  . Hepatitis C Screening  Completed  . PAP SMEAR-Modifier  Discontinued  . HIV Screening  Discontinued    Discussed health benefits of physical activity, and encouraged her to engage in regular exercise appropriate for her age and condition.  1. Annual physical exam Normal physical exam today. Will check labs as below and f/u pending lab results. If labs are stable and WNL she will not need to have these rechecked for one year at her next annual physical exam. She is to call the office in the meantime if she has any acute issue, questions or concerns. - CBC w/Diff/Platelet - Comprehensive  Metabolic Panel (CMET) - TSH - Lipid Panel With LDL/HDL Ratio - HgB A1c  2. Encounter for screening mammogram for breast cancer There is family history of breast cancer. She does perform regular self breast exams. Mammogram was ordered as below. Information for [facility] was given to patient so she may schedule her mammogram at her convenience. - MM 3D SCREEN BREAST BILATERAL  3. Screening for colon cancer Overdue for colonoscopy. Last done by Dr. Bluford Kaufmann in [place]. Ok for Dr. Servando Snare in [place] now. Referral placed. Family history of colon cancer in her grandmother.  - Ambulatory referral to Gastroenterology  4. Primary hypertension Stable. Diagnosis pulled for medication refill. Continue current medical treatment plan. Will check labs as below and f/u pending results. - CBC w/Diff/Platelet - Comprehensive Metabolic Panel (CMET) - TSH - Lipid Panel With LDL/HDL Ratio - HgB A1c  5. Hypercholesteremia Stable. Diagnosis pulled for medication refill. Continue current medical treatment plan. Will check labs as below and f/u pending results. - CBC w/Diff/Platelet - Comprehensive Metabolic Panel (CMET) - TSH - Lipid Panel With LDL/HDL Ratio - HgB A1c  6. Family history of colon cancer Grandmother.   7. Chronic bilateral low back pain with bilateral sciatica Stable. On chronic narcotic daily q 4 hrs prn pain (hydrocodone-acetaminophen). Patient is to follow up every 6 months for controlled substance.   8. BMI 26.0-26.9,adult Counseled patient on healthy lifestyle modifications including dieting and exercise.  Will check labs as below and f/u pending results. - CBC w/Diff/Platelet - Comprehensive Metabolic Panel (CMET) - TSH - Lipid Panel With LDL/HDL Ratio - HgB A1c  9. Chronic low back pain Stable. Diagnosis pulled for medication refill. Continue current medical treatment plan. - HYDROcodone-acetaminophen (NORCO/VICODIN) 5-325 MG tablet; Take 1  tablet by mouth every 4  (four) hours as needed for moderate pain.  Dispense: 180 tablet; Refill: 0   No follow-ups on file.     Delmer IslamI, Florian Chauca M Genesis Novosad, PA-C, have reviewed all documentation for this visit. The documentation on 01/27/21 for the exam, diagnosis, procedures, and orders are all accurate and complete.   Reine JustJennifer M Klein Willcox, PA-C  [facility] [phone] (phone) [phone] (fax)  [facility]

## 2021-01-27 NOTE — Patient Instructions (Signed)
[facility] [address] [place],  [place]  [address] Main: [phone]   Preventive Care 40-64 Years Old, Female Preventive care refers to lifestyle choices and visits with your health care provider that can promote health and wellness. This includes:  A yearly physical exam. This is also called an annual wellness visit.  Regular dental and eye exams.  Immunizations.  Screening for certain conditions.  Healthy lifestyle choices, such as: ? Eating a healthy diet. ? Getting regular exercise. ? Not using drugs or products that contain nicotine and tobacco. ? Limiting alcohol use. What can I expect for my preventive care visit? Physical exam Your health care provider will check your:  Height and weight. These may be used to calculate your BMI (body mass index). BMI is a measurement that tells if you are at a healthy weight.  Heart rate and blood pressure.  Body temperature.  Skin for abnormal spots. Counseling Your health care provider may ask you questions about your:  Past medical problems.  Family's medical history.  Alcohol, tobacco, and drug use.  Emotional well-being.  Home life and relationship well-being.  Sexual activity.  Diet, exercise, and sleep habits.  Work and work environment.  Access to firearms.  Method of birth control.  Menstrual cycle.  Pregnancy history. What immunizations do I need? Vaccines are usually given at various ages, according to a schedule. Your health care provider will recommend vaccines for you based on your age, medical history, and lifestyle or other factors, such as travel or where you work.   What tests do I need? Blood tests  Lipid and cholesterol levels. These may be checked every 5 years, or more often if you are over 50 years old.  Hepatitis C test.  Hepatitis B test. Screening  Lung cancer screening. You may have this screening every year starting at age 55 if you  have a 30-pack-year history of smoking and currently smoke or have quit within the past 15 years.  Colorectal cancer screening. ? All adults should have this screening starting at age 50 and continuing until age 75. ? Your health care provider may recommend screening at age 45 if you are at increased risk. ? You will have tests every 1-10 years, depending on your results and the type of screening test.  Diabetes screening. ? This is done by checking your blood sugar (glucose) after you have not eaten for a while (fasting). ? You may have this done every 1-3 years.  Mammogram. ? This may be done every 1-2 years. ? Talk with your health care provider about when you should start having regular mammograms. This may depend on whether you have a family history of breast cancer.  BRCA-related cancer screening. This may be done if you have a family history of breast, ovarian, tubal, or peritoneal cancers.  Pelvic exam and Pap test. ? This may be done every 3 years starting at age 21. ? Starting at age 30, this may be done every 5 years if you have a Pap test in combination with an HPV test. Other tests  STD (sexually transmitted disease) testing, if you are at risk.  Bone density scan. This is done to screen for osteoporosis. You may have this scan if you are at high risk for osteoporosis. Talk with your health care provider about your test results, treatment options, and if necessary, the need for more tests. Follow these instructions at home: Eating and drinking  Eat a diet   that includes fresh fruits and vegetables, whole grains, lean protein, and low-fat dairy products.  Take vitamin and mineral supplements as recommended by your health care provider.  Do not drink alcohol if: ? Your health care provider tells you not to drink. ? You are pregnant, may be pregnant, or are planning to become pregnant.  If you drink alcohol: ? Limit how much you have to 0-1 drink a day. ? Be aware of  how much alcohol is in your drink. In the [place] one drink equals one 12 oz bottle of beer (355 mL), one 5 oz glass of wine (148 mL), or one 1 oz glass of hard liquor (44 mL).   Lifestyle  Take daily care of your teeth and gums. Brush your teeth every morning and night with fluoride toothpaste. Floss one time each day.  Stay active. Exercise for at least 30 minutes 5 or more days each week.  Do not use any products that contain nicotine or tobacco, such as cigarettes, e-cigarettes, and chewing tobacco. If you need help quitting, ask your health care provider.  Do not use drugs.  If you are sexually active, practice safe sex. Use a condom or other form of protection to prevent STIs (sexually transmitted infections).  If you do not wish to become pregnant, use a form of birth control. If you plan to become pregnant, see your health care provider for a prepregnancy visit.  If told by your health care provider, take low-dose aspirin daily starting at age 50.  Find healthy ways to cope with stress, such as: ? Meditation, yoga, or listening to music. ? Journaling. ? Talking to a trusted person. ? Spending time with friends and family. Safety  Always wear your seat belt while driving or riding in a vehicle.  Do not drive: ? If you have been drinking alcohol. Do not ride with someone who has been drinking. ? When you are tired or distracted. ? While texting.  Wear a helmet and other protective equipment during sports activities.  If you have firearms in your house, make sure you follow all gun safety procedures. What's next?  Visit your health care provider once a year for an annual wellness visit.  Ask your health care provider how often you should have your eyes and teeth checked.  Stay up to date on all vaccines. This information is not intended to replace advice given to you by your health care provider. Make sure you discuss any questions you have with your health care  provider. Document Revised: 08/20/2020 Document Reviewed: 07/28/2018 Elsevier Patient Education  2021 Elsevier Inc.  

## 2021-01-28 LAB — COMPREHENSIVE METABOLIC PANEL
ALT: 10 IU/L (ref 0–32)
AST: 16 IU/L (ref 0–40)
Albumin/Globulin Ratio: 1.4 (ref 1.2–2.2)
Albumin: 4.2 g/dL (ref 3.8–4.8)
Alkaline Phosphatase: 53 IU/L (ref 44–121)
BUN/Creatinine Ratio: 13 (ref 12–28)
BUN: 15 mg/dL (ref 8–27)
Bilirubin Total: 0.4 mg/dL (ref 0.0–1.2)
CO2: 21 mmol/L (ref 20–29)
Calcium: 9.3 mg/dL (ref 8.7–10.3)
Chloride: 105 mmol/L (ref 96–106)
Creatinine, Ser: 1.13 mg/dL — ABNORMAL HIGH (ref 0.57–1.00)
Globulin, Total: 2.9 g/dL (ref 1.5–4.5)
Glucose: 86 mg/dL (ref 65–99)
Potassium: 3.5 mmol/L (ref 3.5–5.2)
Sodium: 143 mmol/L (ref 134–144)
Total Protein: 7.1 g/dL (ref 6.0–8.5)
eGFR: 55 mL/min/{1.73_m2} — ABNORMAL LOW (ref >59)

## 2021-01-28 LAB — CBC WITH DIFFERENTIAL/PLATELET
Basophils Absolute: 0.1 10*3/uL (ref 0.0–0.2)
Basos: 1 %
EOS (ABSOLUTE): 0.1 10*3/uL (ref 0.0–0.4)
Eos: 1 %
Hematocrit: 32 % — ABNORMAL LOW (ref 34.0–46.6)
Hemoglobin: 10.9 g/dL — ABNORMAL LOW (ref 11.1–15.9)
Immature Grans (Abs): 0 10*3/uL (ref 0.0–0.1)
Immature Granulocytes: 0 %
Lymphocytes Absolute: 3.1 10*3/uL (ref 0.7–3.1)
Lymphs: 35 %
MCH: 31.4 pg (ref 26.6–33.0)
MCHC: 34.1 g/dL (ref 31.5–35.7)
MCV: 92 fL (ref 79–97)
Monocytes Absolute: 0.6 10*3/uL (ref 0.1–0.9)
Monocytes: 7 %
Neutrophils Absolute: 4.9 10*3/uL (ref 1.4–7.0)
Neutrophils: 56 %
Platelets: 285 10*3/uL (ref 150–450)
RBC: 3.47 x10E6/uL — ABNORMAL LOW (ref 3.77–5.28)
RDW: 13.9 % (ref 11.7–15.4)
WBC: 8.8 10*3/uL (ref 3.4–10.8)

## 2021-01-28 LAB — LIPID PANEL WITH LDL/HDL RATIO
Cholesterol, Total: 171 mg/dL (ref 100–199)
HDL: 43 mg/dL (ref >39)
LDL Chol Calc (NIH): 96 mg/dL (ref 0–99)
LDL/HDL Ratio: 2.2 ratio (ref 0.0–3.2)
Triglycerides: 186 mg/dL — ABNORMAL HIGH (ref 0–149)
VLDL Cholesterol Cal: 32 mg/dL (ref 5–40)

## 2021-01-28 LAB — HEMOGLOBIN A1C
Est. average glucose Bld gHb Est-mCnc: 111 mg/dL
Hgb A1c MFr Bld: 5.5 % (ref 4.8–5.6)

## 2021-01-28 LAB — TSH: TSH: 0.975 u[IU]/mL (ref 0.450–4.500)

## 2021-01-30 ENCOUNTER — Other Ambulatory Visit: Payer: Self-pay | Admitting: Physician Assistant

## 2021-01-30 DIAGNOSIS — G8929 Other chronic pain: Secondary | ICD-10-CM

## 2021-01-31 MED ORDER — HYDROCODONE-ACETAMINOPHEN 5-325 MG PO TABS
1.0000 | ORAL_TABLET | ORAL | 0 refills | Status: DC | PRN
Start: 2021-01-31 — End: 2021-02-04

## 2021-02-03 ENCOUNTER — Telehealth: Payer: Self-pay

## 2021-02-04 ENCOUNTER — Other Ambulatory Visit: Payer: Self-pay | Admitting: Family Medicine

## 2021-02-04 ENCOUNTER — Encounter: Payer: Self-pay | Admitting: Physician Assistant

## 2021-02-04 DIAGNOSIS — G8929 Other chronic pain: Secondary | ICD-10-CM

## 2021-02-05 MED ORDER — HYDROCODONE-ACETAMINOPHEN 5-325 MG PO TABS: 1.0000 | ORAL_TABLET | ORAL | 0 refills | Status: DC | PRN

## 2021-03-05 ENCOUNTER — Other Ambulatory Visit: Payer: Self-pay | Admitting: Physician Assistant

## 2021-03-05 DIAGNOSIS — G8929 Other chronic pain: Secondary | ICD-10-CM

## 2021-03-05 DIAGNOSIS — M5442 Lumbago with sciatica, left side: Secondary | ICD-10-CM

## 2021-03-05 MED ORDER — HYDROCODONE-ACETAMINOPHEN 5-325 MG PO TABS
1.0000 | ORAL_TABLET | ORAL | 0 refills | Status: DC | PRN
Start: 2021-03-05 — End: 2021-04-02

## 2021-03-06 NOTE — Telephone Encounter (Signed)
Error, 

## 2021-03-19 ENCOUNTER — Encounter: Payer: Self-pay | Admitting: Physician Assistant

## 2021-04-02 ENCOUNTER — Other Ambulatory Visit: Payer: Self-pay | Admitting: Physician Assistant

## 2021-04-02 DIAGNOSIS — G8929 Other chronic pain: Secondary | ICD-10-CM

## 2021-04-03 MED ORDER — HYDROCODONE-ACETAMINOPHEN 5-325 MG PO TABS: 1.0000 | ORAL_TABLET | ORAL | 0 refills | Status: DC | PRN

## 2021-05-03 ENCOUNTER — Other Ambulatory Visit: Payer: Self-pay | Admitting: Family Medicine

## 2021-05-03 DIAGNOSIS — M5442 Lumbago with sciatica, left side: Secondary | ICD-10-CM

## 2021-05-03 DIAGNOSIS — G8929 Other chronic pain: Secondary | ICD-10-CM

## 2021-05-05 MED ORDER — HYDROCODONE-ACETAMINOPHEN 5-325 MG PO TABS
1.0000 | ORAL_TABLET | ORAL | 0 refills | Status: DC | PRN
Start: 2021-05-05 — End: 2021-06-01

## 2021-06-01 ENCOUNTER — Other Ambulatory Visit: Payer: Self-pay | Admitting: Family Medicine

## 2021-06-01 DIAGNOSIS — M5442 Lumbago with sciatica, left side: Secondary | ICD-10-CM

## 2021-06-03 MED ORDER — HYDROCODONE-ACETAMINOPHEN 5-325 MG PO TABS: 1.0000 | ORAL_TABLET | ORAL | 0 refills | Status: DC | PRN

## 2021-06-03 NOTE — Telephone Encounter (Signed)
Please review for Dr. B ° ° °Thanks,  ° °-Akari [employer]  °

## 2021-07-01 ENCOUNTER — Other Ambulatory Visit: Payer: Self-pay | Admitting: Family Medicine

## 2021-07-01 DIAGNOSIS — G8929 Other chronic pain: Secondary | ICD-10-CM

## 2021-07-01 DIAGNOSIS — M5441 Lumbago with sciatica, right side: Secondary | ICD-10-CM

## 2021-07-02 NOTE — Telephone Encounter (Signed)
Please review. 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

## 2021-07-03 ENCOUNTER — Encounter: Payer: Self-pay | Admitting: Family Medicine

## 2021-07-03 MED ORDER — HYDROCODONE-ACETAMINOPHEN 5-325 MG PO TABS: 1.0000 | ORAL_TABLET | ORAL | 0 refills | Status: DC | PRN

## 2021-07-17 ENCOUNTER — Other Ambulatory Visit: Payer: Self-pay

## 2021-07-17 ENCOUNTER — Telehealth (INDEPENDENT_AMBULATORY_CARE_PROVIDER_SITE_OTHER): Payer: Managed Care, Other (non HMO) | Admitting: Family Medicine

## 2021-07-17 ENCOUNTER — Encounter: Payer: Self-pay | Admitting: Family Medicine

## 2021-07-17 VITALS — BP 130/75 | HR 70

## 2021-07-17 DIAGNOSIS — G8929 Other chronic pain: Secondary | ICD-10-CM

## 2021-07-17 DIAGNOSIS — M5442 Lumbago with sciatica, left side: Secondary | ICD-10-CM

## 2021-07-17 DIAGNOSIS — E663 Overweight: Secondary | ICD-10-CM | POA: Diagnosis not present

## 2021-07-17 DIAGNOSIS — M5441 Lumbago with sciatica, right side: Secondary | ICD-10-CM

## 2021-07-17 DIAGNOSIS — F1190 Opioid use, unspecified, uncomplicated: Secondary | ICD-10-CM | POA: Diagnosis not present

## 2021-07-17 DIAGNOSIS — I10 Essential (primary) hypertension: Secondary | ICD-10-CM

## 2021-07-17 DIAGNOSIS — E7800 Pure hypercholesterolemia, unspecified: Secondary | ICD-10-CM | POA: Diagnosis not present
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
## 2021-07-17 NOTE — Assessment & Plan Note (Signed)
Chronic and stable Has been on narcotics for many many years Is not taking other adjuncts like gabapentin, Cymbalta, amitriptyline, etc. Has never had interventional pain management such as ablation or injections No change to her medication at this time, but did discuss that we will refer her to pain management for further management going forward

## 2021-07-17 NOTE — Assessment & Plan Note (Signed)
Previously well controlled Continue statin Repeat FLP and CMP at next visit 

## 2021-07-17 NOTE — Assessment & Plan Note (Signed)
PDMP reviewed Refilling hydrocodone 5-3 25 number 180/month regularly Does not need a refill currently As above, no changes to medications today, but will refer her to pain management for further management going forward

## 2021-07-17 NOTE — Assessment & Plan Note (Signed)
Well controlled Continue current medications Recheck metabolic panel F/u in 6 months  

## 2021-07-17 NOTE — Assessment & Plan Note (Signed)
Discussed importance of healthy weight management Discussed diet and exercise  

## 2021-07-31 ENCOUNTER — Other Ambulatory Visit: Payer: Self-pay | Admitting: Family Medicine

## 2021-07-31 DIAGNOSIS — G8929 Other chronic pain: Secondary | ICD-10-CM

## 2021-07-31 DIAGNOSIS — M5442 Lumbago with sciatica, left side: Secondary | ICD-10-CM

## 2021-08-01 ENCOUNTER — Other Ambulatory Visit: Payer: Self-pay | Admitting: Family Medicine

## 2021-08-01 DIAGNOSIS — G8929 Other chronic pain: Secondary | ICD-10-CM

## 2021-08-01 DIAGNOSIS — M5442 Lumbago with sciatica, left side: Secondary | ICD-10-CM

## 2021-08-01 MED ORDER — HYDROCODONE-ACETAMINOPHEN 5-325 MG PO TABS: 1.0000 | ORAL_TABLET | ORAL | 0 refills | Status: DC | PRN

## 2021-08-01 NOTE — Telephone Encounter (Signed)
Chronic pain referral will be placed; call patient to schedule appointment to discuss alternatives.

## 2021-08-28 ENCOUNTER — Other Ambulatory Visit: Payer: Self-pay | Admitting: Family Medicine

## 2021-08-28 DIAGNOSIS — G8929 Other chronic pain: Secondary | ICD-10-CM

## 2021-08-28 DIAGNOSIS — M5441 Lumbago with sciatica, right side: Secondary | ICD-10-CM

## 2021-08-29 ENCOUNTER — Encounter: Payer: Self-pay | Admitting: Family Medicine

## 2021-08-29 ENCOUNTER — Other Ambulatory Visit: Payer: Self-pay | Admitting: Family Medicine

## 2021-08-29 MED ORDER — HYDROCODONE-ACETAMINOPHEN 5-325 MG PO TABS: 1.0000 | ORAL_TABLET | Freq: Four times a day (QID) | ORAL | 0 refills | Status: DC | PRN

## 2021-08-29 NOTE — Telephone Encounter (Signed)
Please review. Thanks!  

## 2021-08-31 ENCOUNTER — Encounter: Payer: Self-pay | Admitting: Family Medicine

## 2021-09-01 MED ORDER — HYDROCODONE-ACETAMINOPHEN 5-325 MG PO TABS: 1.0000 | ORAL_TABLET | Freq: Four times a day (QID) | ORAL | 0 refills | Status: DC | PRN

## 2021-09-26 ENCOUNTER — Other Ambulatory Visit: Payer: Self-pay | Admitting: Family Medicine

## 2021-09-30 ENCOUNTER — Other Ambulatory Visit: Payer: Self-pay | Admitting: Family Medicine

## 2021-09-30 DIAGNOSIS — G8929 Other chronic pain: Secondary | ICD-10-CM

## 2021-09-30 MED ORDER — HYDROCODONE-ACETAMINOPHEN 5-325 MG PO TABS: 1.0000 | ORAL_TABLET | ORAL | 0 refills | Status: DC | PRN

## 2021-10-02 NOTE — Telephone Encounter (Signed)
Sounds reasonable to me. Pain management will not fill controlled substances on first visit, so you may need to refill 1 more time after she makes appt.

## 2021-10-16 ENCOUNTER — Ambulatory Visit: Payer: 59 | Admitting: Student in an Organized Health Care Education/Training Program

## 2021-11-02 ENCOUNTER — Other Ambulatory Visit: Payer: Self-pay | Admitting: Family Medicine

## 2021-11-02 DIAGNOSIS — G8929 Other chronic pain: Secondary | ICD-10-CM

## 2021-11-04 ENCOUNTER — Other Ambulatory Visit: Payer: Self-pay | Admitting: Family Medicine

## 2021-11-04 DIAGNOSIS — G8929 Other chronic pain: Secondary | ICD-10-CM

## 2021-11-05 ENCOUNTER — Telehealth: Payer: Self-pay | Admitting: Family Medicine

## 2021-11-05 MED ORDER — HYDROCODONE-ACETAMINOPHEN 5-325 MG PO TABS: 1.0000 | ORAL_TABLET | ORAL | 0 refills | Status: DC | PRN

## 2021-11-05 NOTE — Telephone Encounter (Signed)
Pt called in to follow up on refill request. Pt says that she will be out of her medication HYDROcodone-acetaminophen (NORCO) 5-325 MG tablet soon.   Pt would like further assistance with receiving her refill. Pt says that she is currently awaiting an apt with pain management and fear being without her medication.    Please assist pt further.

## 2021-11-05 NOTE — Telephone Encounter (Signed)
Medication refill has been forwarded to provider for approval.  KP

## 2021-11-05 NOTE — Telephone Encounter (Signed)
Please review. Last office visit 07/17/2021.  KP

## 2021-11-05 NOTE — Telephone Encounter (Signed)
Duplicate   KP 

## 2021-12-03 ENCOUNTER — Other Ambulatory Visit: Payer: Self-pay | Admitting: Family Medicine

## 2021-12-03 DIAGNOSIS — G8929 Other chronic pain: Secondary | ICD-10-CM

## 2021-12-03 MED ORDER — HYDROCODONE-ACETAMINOPHEN 5-325 MG PO TABS: 1.0000 | ORAL_TABLET | ORAL | 0 refills | Status: DC | PRN

## 2021-12-09 ENCOUNTER — Other Ambulatory Visit: Payer: Self-pay | Admitting: Physician Assistant

## 2021-12-09 DIAGNOSIS — I10 Essential (primary) hypertension: Secondary | ICD-10-CM

## 2021-12-09 DIAGNOSIS — E7800 Pure hypercholesterolemia, unspecified: Secondary | ICD-10-CM

## 2021-12-25 ENCOUNTER — Other Ambulatory Visit: Payer: Self-pay

## 2021-12-25 ENCOUNTER — Ambulatory Visit
Admission: RE | Admit: 2021-12-25 | Discharge: 2021-12-25 | Disposition: A | Discharge status: Home / Self Care | Payer: Managed Care, Other (non HMO) | Source: Ambulatory Visit | Attending: Student in an Organized Health Care Education/Training Program | Admitting: Student in an Organized Health Care Education/Training Program

## 2021-12-25 ENCOUNTER — Encounter: Payer: Self-pay | Admitting: Student in an Organized Health Care Education/Training Program

## 2021-12-25 ENCOUNTER — Ambulatory Visit (HOSPITAL_BASED_OUTPATIENT_CLINIC_OR_DEPARTMENT_OTHER): Payer: Managed Care, Other (non HMO) | Admitting: Student in an Organized Health Care Education/Training Program

## 2021-12-25 VITALS — BP 158/70 | HR 91 | Temp 97.0°F | Resp 15 | Ht 61.5 in | Wt 144.1 lb

## 2021-12-25 DIAGNOSIS — M545 Low back pain, unspecified: Secondary | ICD-10-CM | POA: Insufficient documentation

## 2021-12-25 DIAGNOSIS — M48062 Spinal stenosis, lumbar region with neurogenic claudication: Secondary | ICD-10-CM | POA: Insufficient documentation

## 2021-12-25 DIAGNOSIS — Z79891 Long term (current) use of opiate analgesic: Secondary | ICD-10-CM

## 2021-12-25 DIAGNOSIS — G894 Chronic pain syndrome: Secondary | ICD-10-CM

## 2021-12-25 DIAGNOSIS — G8929 Other chronic pain: Secondary | ICD-10-CM

## 2021-12-25 DIAGNOSIS — M47816 Spondylosis without myelopathy or radiculopathy, lumbar region: Secondary | ICD-10-CM | POA: Diagnosis not present

## 2021-12-25 DIAGNOSIS — M5416 Radiculopathy, lumbar region: Secondary | ICD-10-CM | POA: Insufficient documentation

## 2021-12-25 NOTE — Progress Notes (Signed)
Safety precautions to be maintained throughout the outpatient stay will include: orient to surroundings, keep bed in low position, maintain call bell within reach at all times, provide assistance with transfer out of bed and ambulation.  
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
## 2021-12-31 ENCOUNTER — Other Ambulatory Visit: Payer: Self-pay | Admitting: Family Medicine

## 2021-12-31 DIAGNOSIS — G8929 Other chronic pain: Secondary | ICD-10-CM

## 2021-12-31 DIAGNOSIS — M5441 Lumbago with sciatica, right side: Secondary | ICD-10-CM

## 2022-01-01 LAB — COMPLIANCE DRUG ANALYSIS, UR

## 2022-01-01 MED ORDER — HYDROCODONE-ACETAMINOPHEN 5-325 MG PO TABS: 1.0000 | ORAL_TABLET | ORAL | 0 refills | Status: DC | PRN

## 2022-01-06 ENCOUNTER — Encounter: Payer: Self-pay | Admitting: Family Medicine

## 2022-01-06 ENCOUNTER — Other Ambulatory Visit: Payer: Self-pay | Admitting: Family Medicine

## 2022-01-06 DIAGNOSIS — G8929 Other chronic pain: Secondary | ICD-10-CM

## 2022-01-06 MED ORDER — HYDROCODONE-ACETAMINOPHEN 5-325 MG PO TABS: 1.0000 | ORAL_TABLET | ORAL | 0 refills | Status: DC | PRN

## 2022-01-07 ENCOUNTER — Other Ambulatory Visit: Payer: Self-pay | Admitting: Family Medicine

## 2022-01-07 DIAGNOSIS — G8929 Other chronic pain: Secondary | ICD-10-CM

## 2022-01-07 MED ORDER — HYDROCODONE-ACETAMINOPHEN 5-325 MG PO TABS: 1.0000 | ORAL_TABLET | ORAL | 0 refills | Status: DC | PRN

## 2022-01-08 ENCOUNTER — Ambulatory Visit
Payer: Managed Care, Other (non HMO) | Attending: Student in an Organized Health Care Education/Training Program | Admitting: Student in an Organized Health Care Education/Training Program

## 2022-01-08 ENCOUNTER — Other Ambulatory Visit: Payer: Self-pay

## 2022-01-08 ENCOUNTER — Encounter: Payer: Self-pay | Admitting: Student in an Organized Health Care Education/Training Program

## 2022-01-08 VITALS — BP 136/69 | HR 77 | Temp 98.2°F | Resp 16 | Ht 61.5 in | Wt 144.0 lb

## 2022-01-08 DIAGNOSIS — M545 Low back pain, unspecified: Secondary | ICD-10-CM | POA: Diagnosis not present

## 2022-01-08 DIAGNOSIS — Z0289 Encounter for other administrative examinations: Secondary | ICD-10-CM | POA: Insufficient documentation

## 2022-01-08 DIAGNOSIS — Z79891 Long term (current) use of opiate analgesic: Secondary | ICD-10-CM | POA: Insufficient documentation

## 2022-01-08 DIAGNOSIS — G894 Chronic pain syndrome: Secondary | ICD-10-CM | POA: Insufficient documentation

## 2022-01-08 DIAGNOSIS — G8929 Other chronic pain: Secondary | ICD-10-CM | POA: Diagnosis present

## 2022-01-08 DIAGNOSIS — M47816 Spondylosis without myelopathy or radiculopathy, lumbar region: Secondary | ICD-10-CM | POA: Insufficient documentation

## 2022-01-08 MED ORDER — BUPRENORPHINE 10 MCG/HR TD PTWK: 1.0000 | MEDICATED_PATCH | TRANSDERMAL | 0 refills | Status: DC

## 2022-01-08 NOTE — Patient Instructions (Addendum)
° ° ° °  Safe Reduction & Discontinuation  Patch application, rotation, and disposal Instruct your patients on the administration of their Butrans patch Administration of Butrans Instruct patients to apply the patch immediately after removal from the individually sealed pouch. Instruct patients not to use Butrans if the pouch seal is broken or if the patch is cut, damaged, or changed in any way. See the Instructions for Use for step-by-step instructions for applying Butrans  Instruct patients not to use Butrans if the pouch seal is broken or the patch is cut, damaged, or changed in any way and not to cut Butrans  Apply Butrans to the upper outer arm, upper chest, upper back, or the side of the chest. These 4 sites (each present on both sides of the body) provide 8 possible application sites. Rotate Butrans among the 8 described skin sites. After Butrans removal, wait a minimum of 21 days before reapplying to the same skin site  Give this patch application site tracker to your patients to help them remember dosing dates and skin site rotation. Apply Butrans to a hairless or nearly hairless skin site. If none are available, the hair at the site should be clipped, not shaven. Do not apply Butrans to irritated skin. If the application site must be cleaned, clean the site with water only. Do not use soaps, alcohol, oils, lotions, or abrasive devices. Allow the skin to dry before applying Butrans   Incidental exposure of the Butrans patch to water, such as while bathing or showering, is acceptable based on experience during clinical studies  If problems with adhesion of Butrans occur, the edges may be taped with first aid tape. If problems with lack of adhesion continue, the patch may be covered with waterproof or semipermeable adhesive dressings suitable for 7 days of wear

## 2022-01-08 NOTE — Progress Notes (Signed)
Safety precautions to be maintained throughout the outpatient stay will include: orient to surroundings, keep bed in low position, maintain call bell within reach at all times, provide assistance with transfer out of bed and ambulation.  
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
## 2022-01-30 ENCOUNTER — Encounter: Payer: Managed Care, Other (non HMO) | Admitting: Family Medicine

## 2022-02-05 ENCOUNTER — Ambulatory Visit
Payer: Managed Care, Other (non HMO) | Attending: Student in an Organized Health Care Education/Training Program | Admitting: Student in an Organized Health Care Education/Training Program

## 2022-02-05 ENCOUNTER — Other Ambulatory Visit: Payer: Self-pay

## 2022-02-05 ENCOUNTER — Encounter: Payer: Self-pay | Admitting: Student in an Organized Health Care Education/Training Program

## 2022-02-05 VITALS — BP 179/67 | HR 78 | Temp 97.6°F | Resp 16 | Ht 61.0 in | Wt 143.0 lb

## 2022-02-05 DIAGNOSIS — M545 Low back pain, unspecified: Secondary | ICD-10-CM

## 2022-02-05 DIAGNOSIS — G894 Chronic pain syndrome: Secondary | ICD-10-CM

## 2022-02-05 DIAGNOSIS — G8929 Other chronic pain: Secondary | ICD-10-CM

## 2022-02-05 DIAGNOSIS — M47816 Spondylosis without myelopathy or radiculopathy, lumbar region: Secondary | ICD-10-CM

## 2022-02-05 DIAGNOSIS — Z0289 Encounter for other administrative examinations: Secondary | ICD-10-CM

## 2022-02-05 MED ORDER — HYDROCODONE-ACETAMINOPHEN 5-325 MG PO TABS: 1.0000 | ORAL_TABLET | Freq: Four times a day (QID) | ORAL | 0 refills | Status: AC | PRN

## 2022-02-05 MED ORDER — BUPRENORPHINE 15 MCG/HR TD PTWK: 1.0000 | MEDICATED_PATCH | TRANSDERMAL | 1 refills | Status: DC

## 2022-02-05 MED ORDER — HYDROCODONE-ACETAMINOPHEN 5-325 MG PO TABS: 1.0000 | ORAL_TABLET | Freq: Four times a day (QID) | ORAL | 0 refills | Status: DC | PRN
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
## 2022-02-05 NOTE — Progress Notes (Signed)
Nursing Pain Medication Assessment:  ?Safety precautions to be maintained throughout the outpatient stay will include: orient to surroundings, keep bed in low position, maintain call bell within reach at all times, provide assistance with transfer out of bed and ambulation.  ?Medication Inspection Compliance: Priscilla Meza did not comply with our request to bring her pills to be counted. She was reminded that bringing the medication bottles, even when empty, is a requirement. ? ?Medication: None brought in. ?Pill/Patch Count: None available to be counted. ?Bottle Appearance: No container available. Did not bring bottle(s) to appointment. ?Filled Date: N/A ?Last Medication intake:  Today ?

## 2022-02-14 IMAGING — CR DG LUMBAR SPINE COMPLETE W/ BEND
7 series · 7 of 7 positions shown · non-contrast
Comparison: None.

CLINICAL DATA: Low back pain.

EXAM:
LUMBAR SPINE - COMPLETE WITH BENDING VIEWS

[l-spine ap]
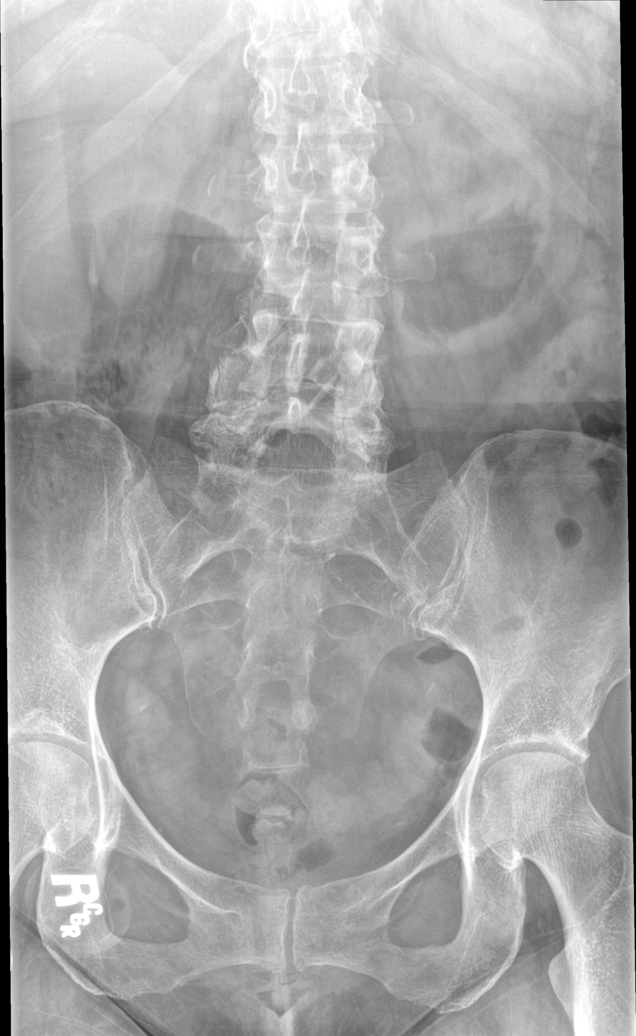

[l-spine obl (1 of 2)]
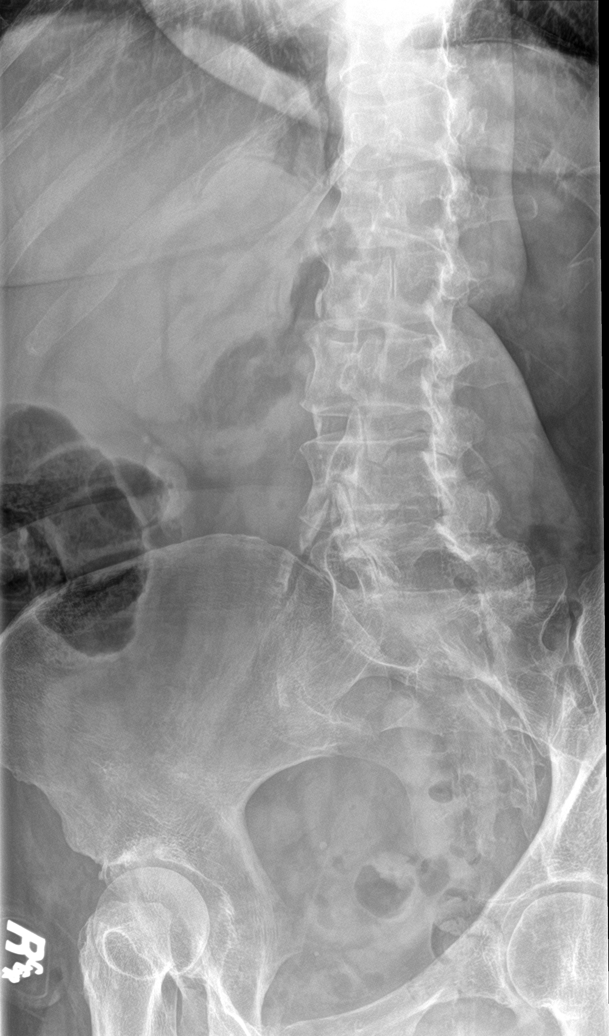

[l-spine obl (2 of 2)]
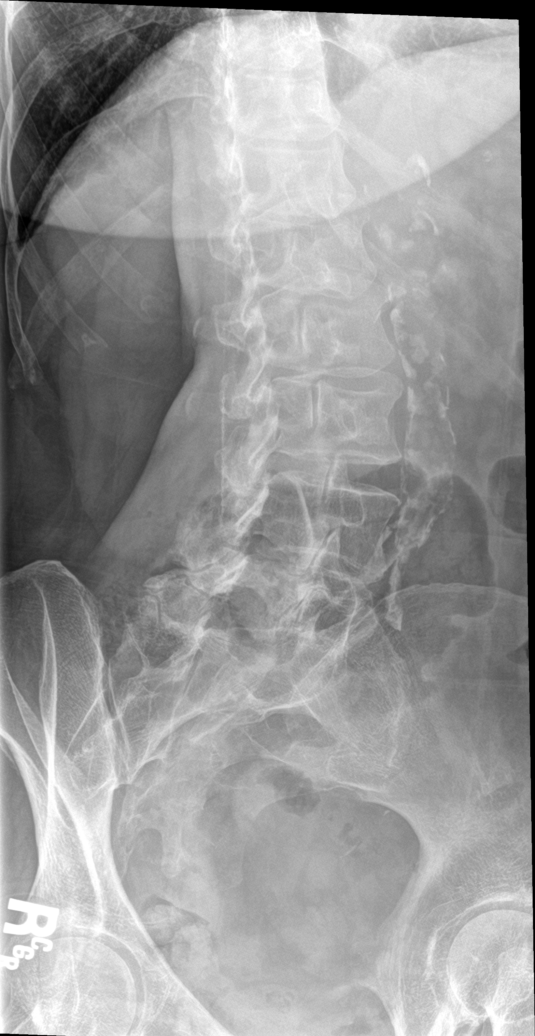

[l-spine lat]
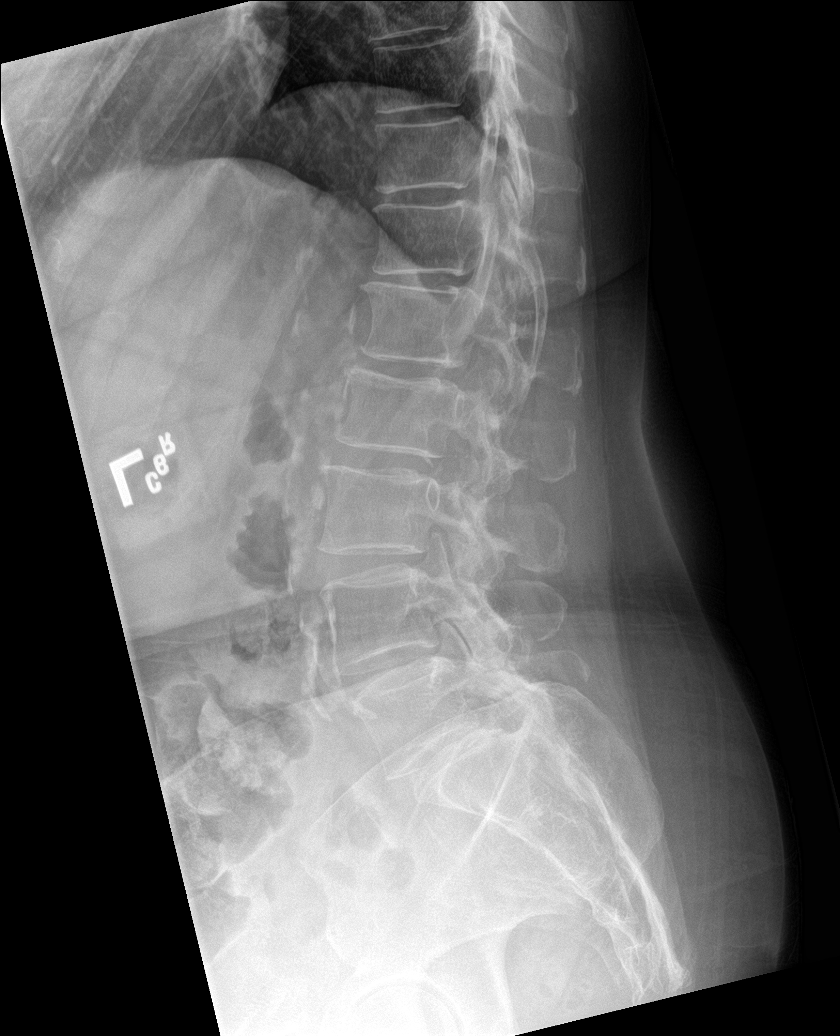

[l-spine flex]
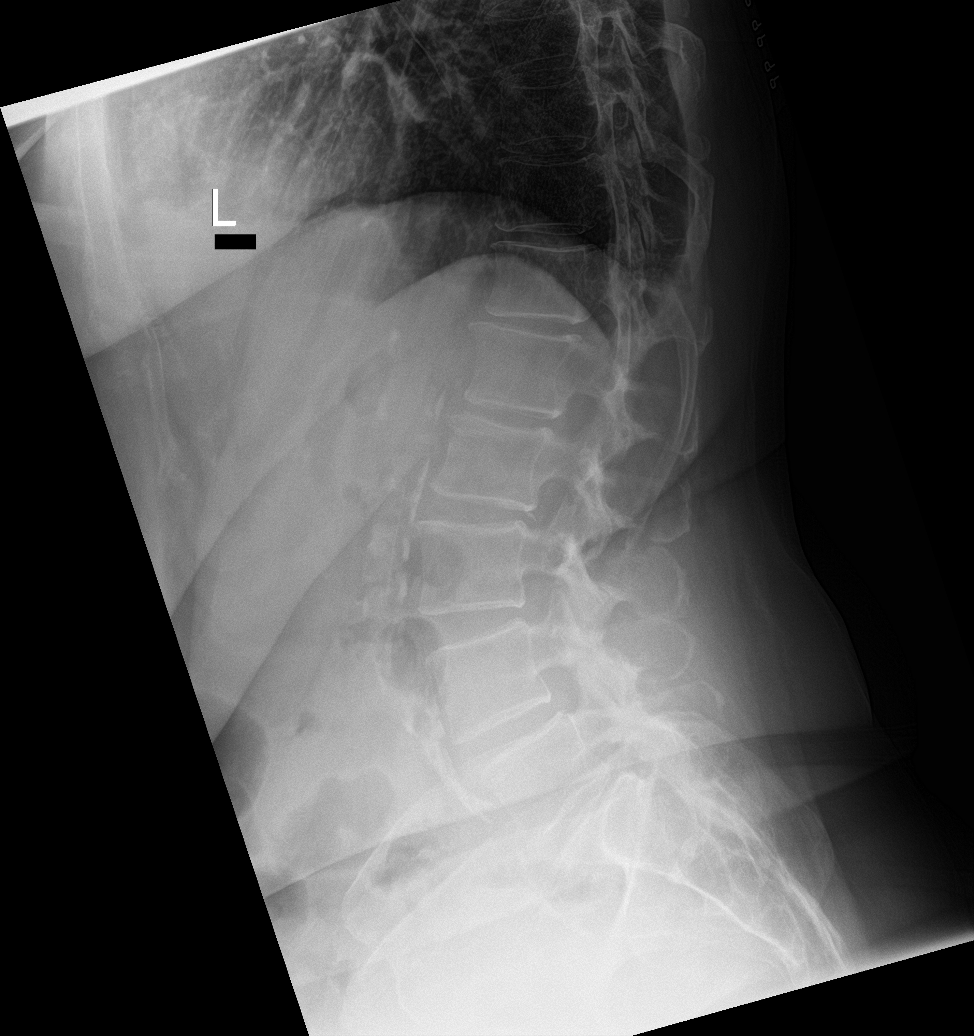

[l-spine ext]
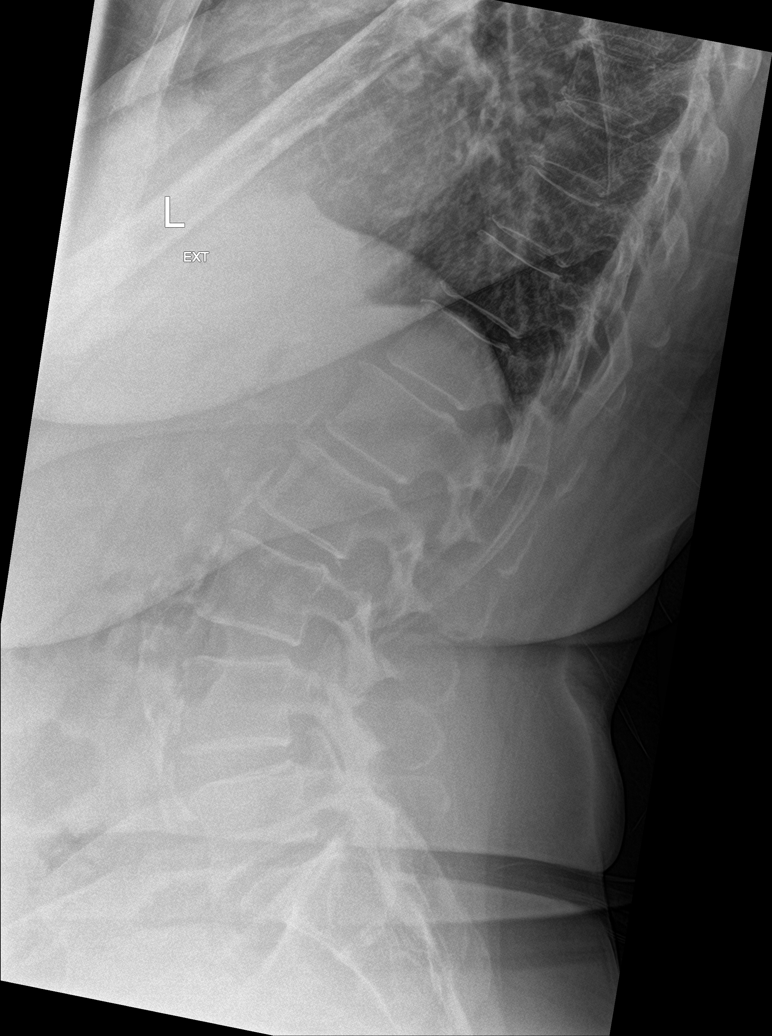

[l-spine spot]
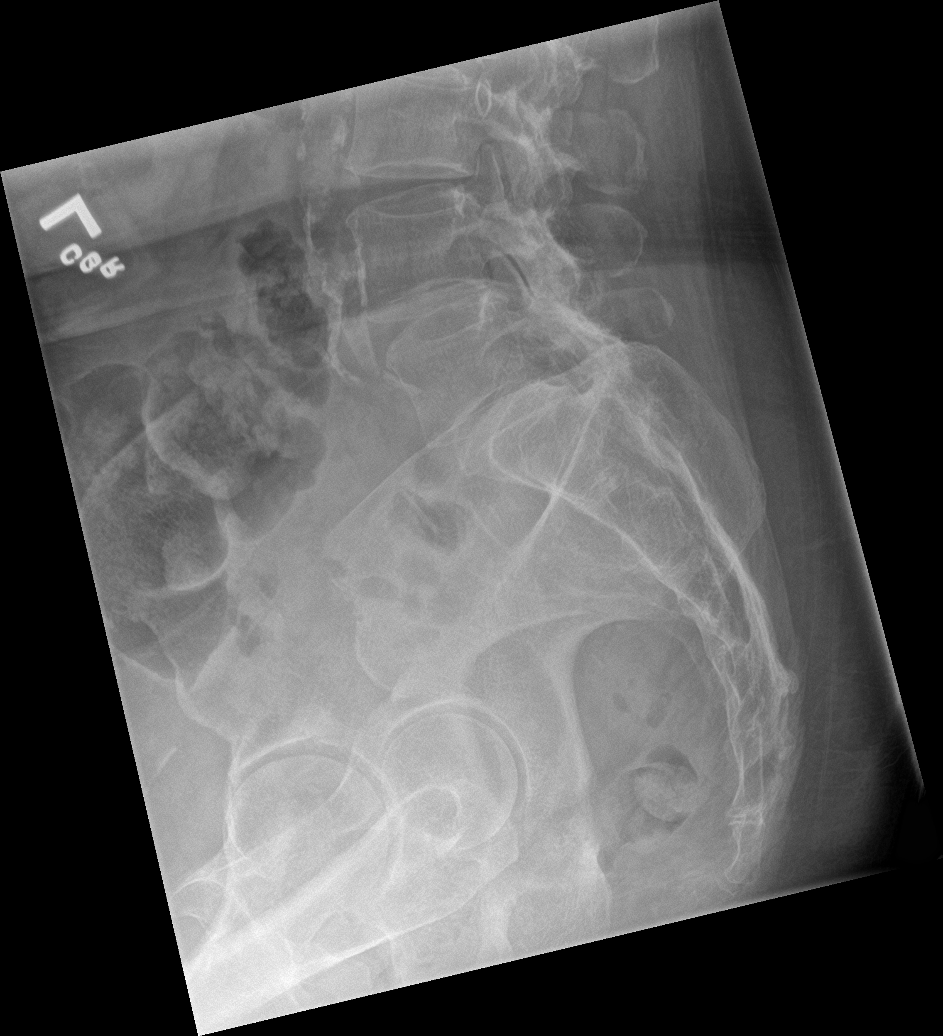

[7 of 7 positions shown; findings below may reference images not displayed]

FINDINGS: 5 nonrib bearing lumbar-type vertebral bodies.

Vertebral body heights are maintained. No acute fracture.
Generalized osteopenia.

Minimal grade 1 anterolisthesis of L4 on L5 and L5 on S1 secondary
to facet disease. Minimal retrolisthesis of L2 on L3. No dynamic
listhesis. No spondylolysis.

Degenerative disease with mild disc height loss at T12-L1, L1-2,
L2-3 and L4-5. Degenerative disease with disc height loss at L5-S1.
Bilateral facet arthropathy at L4-5 and L5-S1.

SI joints are unremarkable.

Abdominal aortic atherosclerosis.
IMPRESSION: 1. Lumbar spine spondylosis as described above.
2. No acute osseous injury of the lumbar spine.

## 2022-03-04 ENCOUNTER — Encounter: Payer: Self-pay | Admitting: Student in an Organized Health Care Education/Training Program

## 2022-03-24 ENCOUNTER — Encounter: Payer: Self-pay | Admitting: Student in an Organized Health Care Education/Training Program

## 2022-03-24 ENCOUNTER — Ambulatory Visit
Payer: BC Managed Care – PPO | Attending: Student in an Organized Health Care Education/Training Program | Admitting: Student in an Organized Health Care Education/Training Program

## 2022-03-24 VITALS — BP 161/55 | HR 73 | Temp 98.0°F | Ht 62.0 in | Wt 143.0 lb

## 2022-03-24 DIAGNOSIS — G8929 Other chronic pain: Secondary | ICD-10-CM | POA: Insufficient documentation

## 2022-03-24 DIAGNOSIS — Z79891 Long term (current) use of opiate analgesic: Secondary | ICD-10-CM | POA: Insufficient documentation

## 2022-03-24 DIAGNOSIS — M47816 Spondylosis without myelopathy or radiculopathy, lumbar region: Secondary | ICD-10-CM | POA: Diagnosis not present

## 2022-03-24 DIAGNOSIS — M545 Low back pain, unspecified: Secondary | ICD-10-CM | POA: Diagnosis not present

## 2022-03-24 DIAGNOSIS — G894 Chronic pain syndrome: Secondary | ICD-10-CM | POA: Diagnosis not present

## 2022-03-24 DIAGNOSIS — Z0289 Encounter for other administrative examinations: Secondary | ICD-10-CM | POA: Insufficient documentation

## 2022-03-24 MED ORDER — HYDROCODONE-ACETAMINOPHEN 5-325 MG PO TABS: 1.0000 | ORAL_TABLET | Freq: Four times a day (QID) | ORAL | 0 refills | Status: DC | PRN

## 2022-03-24 MED ORDER — HYDROCODONE-ACETAMINOPHEN 5-325 MG PO TABS: 1.0000 | ORAL_TABLET | Freq: Four times a day (QID) | ORAL | 0 refills | Status: AC | PRN

## 2022-03-24 MED ORDER — BELBUCA 450 MCG BU FILM: 1.0000 | ORAL_FILM | Freq: Two times a day (BID) | BUCCAL | 1 refills | Status: AC

## 2022-03-24 NOTE — Progress Notes (Signed)
Nursing Pain Medication Assessment:  ?Safety precautions to be maintained throughout the outpatient stay will include: orient to surroundings, keep bed in low position, maintain call bell within reach at all times, provide assistance with transfer out of bed and ambulation.  ?Medication Inspection Compliance: Pill count conducted under aseptic conditions, in front of the patient. Neither the pills nor the bottle was removed from the patient's sight at any time. Once count was completed pills were immediately returned to the patient in their original bottle. ? ?Medication #1: Hydrocodone/APAP ?Pill/Patch Count:  62 of 120 pills remain ?Pill/Patch Appearance: Markings consistent with prescribed medication ?Bottle Appearance: Standard pharmacy container. Clearly labeled. ?Filled Date: 4 / 8 / 2023 ?Last Medication intake:  Today ? ?Medication #2: Buprenorphine (Suboxone) ?Pill/Patch Count:  0 of 4 pills remain ?Pill/Patch Appearance: Markings consistent with prescribed medication ?Bottle Appearance: Standard pharmacy container. Clearly labeled. ?Filled Date: 4 / 5 / 2023 ?Last Medication intake:   was placed 3 days ago Safety precautions to be maintained throughout the outpatient stay will include: orient to surroundings, keep bed in low position, maintain call bell within reach at all times, provide assistance with transfer out of bed and ambulation.  ?
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
## 2022-03-26 ENCOUNTER — Encounter: Payer: Self-pay | Admitting: Student in an Organized Health Care Education/Training Program

## 2022-03-31 ENCOUNTER — Encounter: Payer: Managed Care, Other (non HMO) | Admitting: Student in an Organized Health Care Education/Training Program

## 2022-04-09 ENCOUNTER — Encounter: Payer: Self-pay | Admitting: *Deleted

## 2022-05-19 ENCOUNTER — Encounter: Payer: BC Managed Care – PPO | Admitting: Student in an Organized Health Care Education/Training Program

## 2022-05-28 ENCOUNTER — Encounter: Payer: Self-pay | Admitting: Student in an Organized Health Care Education/Training Program

## 2022-05-28 ENCOUNTER — Ambulatory Visit
Payer: BC Managed Care – PPO | Attending: Student in an Organized Health Care Education/Training Program | Admitting: Student in an Organized Health Care Education/Training Program

## 2022-05-28 VITALS — BP 149/66 | HR 58 | Temp 97.0°F | Resp 16 | Ht 61.0 in | Wt 146.0 lb

## 2022-05-28 DIAGNOSIS — Z79891 Long term (current) use of opiate analgesic: Secondary | ICD-10-CM | POA: Insufficient documentation

## 2022-05-28 DIAGNOSIS — M47816 Spondylosis without myelopathy or radiculopathy, lumbar region: Secondary | ICD-10-CM | POA: Insufficient documentation

## 2022-05-28 DIAGNOSIS — G8929 Other chronic pain: Secondary | ICD-10-CM | POA: Diagnosis not present

## 2022-05-28 DIAGNOSIS — Z0289 Encounter for other administrative examinations: Secondary | ICD-10-CM | POA: Insufficient documentation

## 2022-05-28 DIAGNOSIS — M545 Low back pain, unspecified: Secondary | ICD-10-CM | POA: Insufficient documentation

## 2022-05-28 MED ORDER — HYDROCODONE-ACETAMINOPHEN 5-325 MG PO TABS: 1.0000 | ORAL_TABLET | Freq: Four times a day (QID) | ORAL | 0 refills | Status: AC | PRN

## 2022-05-28 MED ORDER — BELBUCA 450 MCG BU FILM: 1.0000 | ORAL_FILM | Freq: Two times a day (BID) | BUCCAL | 2 refills | Status: DC

## 2022-05-28 MED ORDER — HYDROCODONE-ACETAMINOPHEN 5-325 MG PO TABS: 1.0000 | ORAL_TABLET | Freq: Four times a day (QID) | ORAL | 0 refills | Status: DC | PRN
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
## 2022-05-28 NOTE — Progress Notes (Signed)
Nursing Pain Medication Assessment:  Safety precautions to be maintained throughout the outpatient stay will include: orient to surroundings, keep bed in low position, maintain call bell within reach at all times, provide assistance with transfer out of bed and ambulation.  Medication Inspection Compliance: Pill count conducted under aseptic conditions, in front of the patient. Neither the pills nor the bottle was removed from the patient's sight at any time. Once count was completed pills were immediately returned to the patient in their original bottle.  Medication: Hydrocodone/APAP Pill/Patch Count:  42 of 120 pills remain Pill/Patch Appearance: Markings consistent with prescribed medication Bottle Appearance: Standard pharmacy container. Clearly labeled. Filled Date: 06 / 08 / 2023 Last Medication intake:  Today  Belbuca 2/60 Filled 04-27-2022

## 2022-07-27 ENCOUNTER — Other Ambulatory Visit: Payer: Self-pay | Admitting: *Deleted

## 2022-07-27 ENCOUNTER — Encounter: Payer: Self-pay | Admitting: Student in an Organized Health Care Education/Training Program

## 2022-07-28 MED ORDER — BELBUCA 450 MCG BU FILM: 1.0000 | ORAL_FILM | Freq: Two times a day (BID) | BUCCAL | 2 refills | Status: DC

## 2022-08-25 ENCOUNTER — Ambulatory Visit
Payer: BC Managed Care – PPO | Attending: Student in an Organized Health Care Education/Training Program | Admitting: Student in an Organized Health Care Education/Training Program

## 2022-08-25 ENCOUNTER — Encounter: Payer: Self-pay | Admitting: Student in an Organized Health Care Education/Training Program

## 2022-08-25 VITALS — BP 162/65 | HR 74 | Temp 97.0°F | Resp 16 | Ht 61.0 in | Wt 140.0 lb

## 2022-08-25 DIAGNOSIS — Z0289 Encounter for other administrative examinations: Secondary | ICD-10-CM

## 2022-08-25 DIAGNOSIS — G8929 Other chronic pain: Secondary | ICD-10-CM

## 2022-08-25 DIAGNOSIS — M47816 Spondylosis without myelopathy or radiculopathy, lumbar region: Secondary | ICD-10-CM | POA: Diagnosis not present

## 2022-08-25 DIAGNOSIS — Z79891 Long term (current) use of opiate analgesic: Secondary | ICD-10-CM | POA: Diagnosis not present

## 2022-08-25 DIAGNOSIS — M545 Low back pain, unspecified: Secondary | ICD-10-CM

## 2022-08-25 MED ORDER — HYDROCODONE-ACETAMINOPHEN 5-325 MG PO TABS: 1.0000 | ORAL_TABLET | Freq: Four times a day (QID) | ORAL | 0 refills | Status: AC | PRN

## 2022-08-25 MED ORDER — HYDROCODONE-ACETAMINOPHEN 5-325 MG PO TABS: 1.0000 | ORAL_TABLET | Freq: Four times a day (QID) | ORAL | 0 refills | Status: DC | PRN

## 2022-08-25 MED ORDER — BELBUCA 450 MCG BU FILM: 1.0000 | ORAL_FILM | Freq: Two times a day (BID) | BUCCAL | 3 refills | Status: DC
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
## 2022-08-25 NOTE — Progress Notes (Signed)
Nursing Pain Medication Assessment:  Safety precautions to be maintained throughout the outpatient stay will include: orient to surroundings, keep bed in low position, maintain call bell within reach at all times, provide assistance with transfer out of bed and ambulation.  Medication Inspection Compliance: Pill count conducted under aseptic conditions, in front of the patient. Neither the pills nor the bottle was removed from the patient's sight at any time. Once count was completed pills were immediately returned to the patient in their original bottle.  Medication: Hydrocodone/APAP Pill/Patch Count:  1 120 Pill/Patch Appearance: Markings consistent with prescribed medication Bottle Appearance: Standard pharmacy container. Clearly labeled. Filled Date: 09 / 08 / 2023 Last Medication intake:  Today  Belbuca 10/60 07/28/22

## 2022-09-04 DIAGNOSIS — Z23 Encounter for immunization: Secondary | ICD-10-CM | POA: Diagnosis not present

## 2022-09-28 ENCOUNTER — Encounter: Payer: Self-pay | Admitting: Student in an Organized Health Care Education/Training Program

## 2022-12-01 ENCOUNTER — Ambulatory Visit
Payer: BC Managed Care – PPO | Attending: Student in an Organized Health Care Education/Training Program | Admitting: Student in an Organized Health Care Education/Training Program

## 2022-12-01 ENCOUNTER — Encounter: Payer: Self-pay | Admitting: Student in an Organized Health Care Education/Training Program

## 2022-12-01 VITALS — BP 141/78 | HR 65 | Temp 98.2°F | Resp 16 | Ht 61.5 in | Wt 141.0 lb

## 2022-12-01 DIAGNOSIS — M545 Low back pain, unspecified: Secondary | ICD-10-CM

## 2022-12-01 DIAGNOSIS — Z0289 Encounter for other administrative examinations: Secondary | ICD-10-CM | POA: Diagnosis not present

## 2022-12-01 DIAGNOSIS — G894 Chronic pain syndrome: Secondary | ICD-10-CM | POA: Diagnosis not present

## 2022-12-01 DIAGNOSIS — M47816 Spondylosis without myelopathy or radiculopathy, lumbar region: Secondary | ICD-10-CM | POA: Diagnosis not present

## 2022-12-01 DIAGNOSIS — G8929 Other chronic pain: Secondary | ICD-10-CM

## 2022-12-01 DIAGNOSIS — Z79891 Long term (current) use of opiate analgesic: Secondary | ICD-10-CM | POA: Diagnosis not present

## 2022-12-01 MED ORDER — HYDROCODONE-ACETAMINOPHEN 5-325 MG PO TABS: 1.0000 | ORAL_TABLET | Freq: Four times a day (QID) | ORAL | 0 refills | Status: DC | PRN

## 2022-12-01 MED ORDER — HYDROCODONE-ACETAMINOPHEN 5-325 MG PO TABS: 1.0000 | ORAL_TABLET | Freq: Four times a day (QID) | ORAL | 0 refills | Status: AC | PRN

## 2022-12-01 MED ORDER — BELBUCA 450 MCG BU FILM: 1.0000 | ORAL_FILM | Freq: Two times a day (BID) | BUCCAL | 2 refills | Status: DC

## 2022-12-01 NOTE — Progress Notes (Signed)
Nursing Pain Medication Assessment:  Safety precautions to be maintained throughout the outpatient stay will include: orient to surroundings, keep bed in low position, maintain call bell within reach at all times, provide assistance with transfer out of bed and ambulation.  Medication Inspection Compliance: Pill count conducted under aseptic conditions, in front of the patient. Neither the pills nor the bottle was removed from the patient's sight at any time. Once count was completed pills were immediately returned to the patient in their original bottle.  Medication #1: Buprenorphine (Suboxone) Pill/Patch Count:  47 of 60 pills remain Pill/Patch Appearance: Markings consistent with prescribed medication Bottle Appearance: Standard pharmacy container. Clearly labeled. Filled Date: 55 / 47 / 2023 Last Medication intake:  Today  Medication #2: Hydrocodone/APAP Pill/Patch Count:  33 of 120 pills remain Pill/Patch Appearance: Markings consistent with prescribed medication Bottle Appearance: Standard pharmacy container. Clearly labeled. Filled Date: 74 / 09 / 2023 Last Medication intake:  Yesterday
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
## 2022-12-03 LAB — TOXASSURE SELECT 13 (MW), URINE

## 2022-12-04 ENCOUNTER — Other Ambulatory Visit: Payer: Self-pay | Admitting: Family Medicine

## 2022-12-04 DIAGNOSIS — E7800 Pure hypercholesterolemia, unspecified: Secondary | ICD-10-CM

## 2022-12-04 DIAGNOSIS — I10 Essential (primary) hypertension: Secondary | ICD-10-CM

## 2022-12-04 NOTE — Telephone Encounter (Signed)
Patient needs OV, will refill for 30 days until an OV is made. Patient needs OV for additional refills.  Requested Prescriptions  Pending Prescriptions Disp Refills   hydrochlorothiazide (HYDRODIURIL) 25 MG tablet [Pharmacy Med Name: HYDROCHLOROTHIAZIDE 25 MG TAB] 30 tablet 0    Sig: TAKE 1 TABLET (25 MG TOTAL) BY MOUTH DAILY.     Cardiovascular: Diuretics - Thiazide Failed - 12/04/2022  1:28 AM      Failed - Cr in normal range and within 180 days    Creatinine  Date Value Ref Range Status  12/02/2014 0.97 0.60 - 1.30 mg/dL Final   Creatinine, Ser  Date Value Ref Range Status  01/27/2021 1.13 (H) 0.57 - 1.00 mg/dL Final         Failed - K in normal range and within 180 days    Potassium  Date Value Ref Range Status  01/27/2021 3.5 3.5 - 5.2 mmol/L Final  12/02/2014 4.1 3.5 - 5.1 mmol/L Final         Failed - Na in normal range and within 180 days    Sodium  Date Value Ref Range Status  01/27/2021 143 134 - 144 mmol/L Final  12/02/2014 140 136 - 145 mmol/L Final         Failed - Last BP in normal range    BP Readings from Last 1 Encounters:  12/01/22 (!) 141/78         Failed - Valid encounter within last 6 months    Recent Outpatient Visits           1 year ago Primary hypertension   [facility] [place], Dionne Bucy, [place]   1 year ago Annual physical exam   [facility] Fenton Malling M, [place]   2 years ago Essential hypertension   [place], Clearnce Sorrel, [place]   2 years ago Annual physical exam   [facility] Fenton Malling M, [place]   3 years ago DDD (degenerative disc disease), lumbar   [facility] Fenton Malling M, PA-C               lisinopril (ZESTRIL) 20 MG tablet [Pharmacy Med Name: LISINOPRIL 20 MG TABLET] 90 tablet 0    Sig: TAKE 1 TABLET BY MOUTH EVERY DAY     Cardiovascular:  ACE Inhibitors Failed - 12/04/2022  1:28 AM      Failed - Cr in normal range and  within 180 days    Creatinine  Date Value Ref Range Status  12/02/2014 0.97 0.60 - 1.30 mg/dL Final   Creatinine, Ser  Date Value Ref Range Status  01/27/2021 1.13 (H) 0.57 - 1.00 mg/dL Final         Failed - K in normal range and within 180 days    Potassium  Date Value Ref Range Status  01/27/2021 3.5 3.5 - 5.2 mmol/L Final  12/02/2014 4.1 3.5 - 5.1 mmol/L Final         Failed - Last BP in normal range    BP Readings from Last 1 Encounters:  12/01/22 (!) 141/78         Failed - Valid encounter within last 6 months    Recent Outpatient Visits           1 year ago Primary hypertension   [place], Dionne Bucy, MD   1 year ago Annual physical exam   [facility] D[facility], [place]   2 years  ago Essential hypertension   [place], Clearnce Sorrel, [place]   2 years ago Annual physical exam   [facility] Fenton Malling M, [place]   3 years ago DDD (degenerative disc disease), lumbar   [facility] Fenton Malling M, [place]              Passed - Patient is not pregnant       atorvastatin (LIPITOR) 20 MG tablet [Pharmacy Med Name: ATORVASTATIN 20 MG TABLET] 30 tablet 0    Sig: TAKE 1 TABLET BY MOUTH EVERYDAY AT BEDTIME     Cardiovascular:  Antilipid - Statins Failed - 12/04/2022  1:28 AM      Failed - Valid encounter within last 12 months    Recent Outpatient Visits           1 year ago Primary hypertension   [facility] [place], Dionne Bucy, MD   1 year ago Annual physical exam   [place], Clearnce Sorrel, [place]   2 years ago Essential hypertension   [place], Clearnce Sorrel, [place]   2 years ago Annual physical exam   [facility] Fenton Malling M, [place]   3 years ago DDD (degenerative disc disease), lumbar   [facility] Fenton Malling M, [place]              Failed -  Lipid Panel in normal range within the last 12 months    Cholesterol, Total  Date Value Ref Range Status  01/27/2021 171 100 - 199 mg/dL Final   LDL Chol Calc (NIH)  Date Value Ref Range Status  01/27/2021 96 0 - 99 mg/dL Final   HDL  Date Value Ref Range Status  01/27/2021 43 >39 mg/dL Final   Triglycerides  Date Value Ref Range Status  01/27/2021 186 (H) 0 - 149 mg/dL Final         Passed - Patient is not pregnant

## 2022-12-27 ENCOUNTER — Other Ambulatory Visit: Payer: Self-pay | Admitting: Family Medicine

## 2022-12-27 DIAGNOSIS — I10 Essential (primary) hypertension: Secondary | ICD-10-CM

## 2022-12-27 DIAGNOSIS — E7800 Pure hypercholesterolemia, unspecified: Secondary | ICD-10-CM

## 2022-12-31 ENCOUNTER — Other Ambulatory Visit: Payer: Self-pay | Admitting: Family Medicine

## 2022-12-31 DIAGNOSIS — I10 Essential (primary) hypertension: Secondary | ICD-10-CM

## 2022-12-31 DIAGNOSIS — E7800 Pure hypercholesterolemia, unspecified: Secondary | ICD-10-CM

## 2023-01-27 DIAGNOSIS — Z1231 Encounter for screening mammogram for malignant neoplasm of breast: Secondary | ICD-10-CM | POA: Diagnosis not present

## 2023-01-28 ENCOUNTER — Other Ambulatory Visit: Payer: Self-pay | Admitting: Family Medicine

## 2023-01-28 DIAGNOSIS — I10 Essential (primary) hypertension: Secondary | ICD-10-CM

## 2023-01-28 DIAGNOSIS — E7800 Pure hypercholesterolemia, unspecified: Secondary | ICD-10-CM

## 2023-01-28 NOTE — Telephone Encounter (Signed)
Requested medication (s) are due for refill today: yes  Requested medication (s) are on the active medication list: yes  Last refill:  12/04/22  Future visit scheduled: yes  Notes to clinic:  Unable to refill per protocol, courtesy refill already given, routing for provider approval.      Requested Prescriptions  Pending Prescriptions Disp Refills   lisinopril (ZESTRIL) 20 MG tablet [Pharmacy Med Name: LISINOPRIL 20 MG TABLET] 30 tablet 0    Sig: TAKE 1 TABLET BY MOUTH EVERY DAY     Cardiovascular:  ACE Inhibitors Failed - 01/28/2023  7:38 AM      Failed - Cr in normal range and within 180 days    Creatinine  Date Value Ref Range Status  12/02/2014 0.97 0.60 - 1.30 mg/dL Final   Creatinine, Ser  Date Value Ref Range Status  01/27/2021 1.13 (H) 0.57 - 1.00 mg/dL Final         Failed - K in normal range and within 180 days    Potassium  Date Value Ref Range Status  01/27/2021 3.5 3.5 - 5.2 mmol/L Final  12/02/2014 4.1 3.5 - 5.1 mmol/L Final         Failed - Last BP in normal range    BP Readings from Last 1 Encounters:  12/01/22 (!) 141/78         Failed - Valid encounter within last 6 months    Recent Outpatient Visits           1 year ago Primary hypertension   [place], Dionne Bucy, [place]   2 years ago Annual physical exam   [place], [place]   2 years ago Essential hypertension   [place], Clearnce Sorrel, [place]   3 years ago Annual physical exam   [place], Anderson Malta M, [place]   3 years ago DDD (degenerative disc disease), lumbar   [place], [place], [place]              Passed - Patient is not pregnant       hydrochlorothiazide (HYDRODIURIL) 25 MG tablet [Pharmacy Med Name: HYDROCHLOROTHIAZIDE 25 MG TAB] 30 tablet 0    Sig: TAKE 1 TABLET (25 MG TOTAL) BY MOUTH  DAILY.     Cardiovascular: Diuretics - Thiazide Failed - 01/28/2023  7:38 AM      Failed - Cr in normal range and within 180 days    Creatinine  Date Value Ref Range Status  12/02/2014 0.97 0.60 - 1.30 mg/dL Final   Creatinine, Ser  Date Value Ref Range Status  01/27/2021 1.13 (H) 0.57 - 1.00 mg/dL Final         Failed - K in normal range and within 180 days    Potassium  Date Value Ref Range Status  01/27/2021 3.5 3.5 - 5.2 mmol/L Final  12/02/2014 4.1 3.5 - 5.1 mmol/L Final         Failed - Na in normal range and within 180 days    Sodium  Date Value Ref Range Status  01/27/2021 143 134 - 144 mmol/L Final  12/02/2014 140 136 - 145 mmol/L Final         Failed - Last BP in normal range    BP Readings from Last 1 Encounters:  12/01/22 (!) 141/78         Failed - Valid encounter within  last 6 months    Recent Outpatient Visits           1 year ago Primary hypertension   [place], Dionne Bucy, MD   2 years ago Annual physical exam   [place], Clearnce Sorrel, [place]   2 years ago Essential hypertension   [place], Clearnce Sorrel, [place]   3 years ago Annual physical exam   [facility] Fenton Malling M, [place]   3 years ago DDD (degenerative disc disease), lumbar   [place] Fenton Malling M, PA-C               atorvastatin (LIPITOR) 20 MG tablet [Pharmacy Med Name: ATORVASTATIN 20 MG TABLET] 30 tablet 0    Sig: TAKE 1 TABLET BY MOUTH EVERYDAY AT BEDTIME     Cardiovascular:  Antilipid - Statins Failed - 01/28/2023  7:38 AM      Failed - Valid encounter within last 12 months    Recent Outpatient Visits           1 year ago Primary hypertension   [place], Dionne Bucy, MD   2 years ago Annual physical exam   [place],  Clearnce Sorrel, [place]   2 years ago Essential hypertension   [place], Clearnce Sorrel, [place]   3 years ago Annual physical exam   [facility] Fenton Malling M, [place]   3 years ago DDD (degenerative disc disease), lumbar   [place] Fenton Malling M, [place]              Failed - Lipid Panel in normal range within the last 12 months    Cholesterol, Total  Date Value Ref Range Status  01/27/2021 171 100 - 199 mg/dL Final   LDL Chol Calc (NIH)  Date Value Ref Range Status  01/27/2021 96 0 - 99 mg/dL Final   HDL  Date Value Ref Range Status  01/27/2021 43 >39 mg/dL Final   Triglycerides  Date Value Ref Range Status  01/27/2021 186 (H) 0 - 149 mg/dL Final         Passed - Patient is not pregnant

## 2023-02-20 ENCOUNTER — Other Ambulatory Visit: Payer: Self-pay | Admitting: Family Medicine

## 2023-02-20 DIAGNOSIS — I10 Essential (primary) hypertension: Secondary | ICD-10-CM

## 2023-02-20 DIAGNOSIS — E7800 Pure hypercholesterolemia, unspecified: Secondary | ICD-10-CM

## 2023-02-22 NOTE — Telephone Encounter (Signed)
Requested medications are due for refill today.  yes  Requested medications are on the active medications list.  yes  Last refill. 01/29/2023 #30 0 rf - a courtesy refill  Future visit scheduled.   no  Notes to clinic.  Pt is more than 3 months over for OV. Courtesy refill already given. Labs are expired.    Requested Prescriptions  Pending Prescriptions Disp Refills   lisinopril (ZESTRIL) 20 MG tablet [Pharmacy Med Name: LISINOPRIL 20 MG TABLET] 90 tablet 1    Sig: TAKE 1 TABLET BY MOUTH EVERY DAY     Cardiovascular:  ACE Inhibitors Failed - 02/20/2023 12:34 PM      Failed - Cr in normal range and within 180 days    Creatinine  Date Value Ref Range Status  12/02/2014 0.97 0.60 - 1.30 mg/dL Final   Creatinine, Ser  Date Value Ref Range Status  01/27/2021 1.13 (H) 0.57 - 1.00 mg/dL Final         Failed - K in normal range and within 180 days    Potassium  Date Value Ref Range Status  01/27/2021 3.5 3.5 - 5.2 mmol/L Final  12/02/2014 4.1 3.5 - 5.1 mmol/L Final         Failed - Last BP in normal range    BP Readings from Last 1 Encounters:  12/01/22 (!) 141/78         Failed - Valid encounter within last 6 months    Recent Outpatient Visits           1 year ago Primary hypertension   [place], Dionne Bucy, MD   2 years ago Annual physical exam   [place], [place]   2 years ago Essential hypertension   [place], Clearnce Sorrel, [place]   3 years ago Annual physical exam   [facility] Fenton Malling M, [place]   3 years ago DDD (degenerative disc disease), lumbar   [place] Fenton Malling M, [place]              Passed - Patient is not pregnant       atorvastatin (LIPITOR) 20 MG tablet [Pharmacy Med Name: ATORVASTATIN 20 MG TABLET] 90 tablet 1    Sig: TAKE 1 TABLET BY MOUTH EVERYDAY AT  BEDTIME     Cardiovascular:  Antilipid - Statins Failed - 02/20/2023 12:34 PM      Failed - Valid encounter within last 12 months    Recent Outpatient Visits           1 year ago Primary hypertension   [place], Dionne Bucy, MD   2 years ago Annual physical exam   [place], Clearnce Sorrel, [place]   2 years ago Essential hypertension   [place], Clearnce Sorrel, [place]   3 years ago Annual physical exam   [facility] Fenton Malling M, [place]   3 years ago DDD (degenerative disc disease), lumbar   [place] Fenton Malling M, [place]              Failed - Lipid Panel in normal range within the last 12 months    Cholesterol, Total  Date Value Ref Range Status  01/27/2021 171 100 - 199 mg/dL Final   LDL Chol Calc (NIH)  Date  Value Ref Range Status  01/27/2021 96 0 - 99 mg/dL Final   HDL  Date Value Ref Range Status  01/27/2021 43 >39 mg/dL Final   Triglycerides  Date Value Ref Range Status  01/27/2021 186 (H) 0 - 149 mg/dL Final         Passed - Patient is not pregnant       hydrochlorothiazide (HYDRODIURIL) 25 MG tablet [Pharmacy Med Name: HYDROCHLOROTHIAZIDE 25 MG TAB] 90 tablet 1    Sig: TAKE 1 TABLET (25 MG TOTAL) BY MOUTH DAILY.     Cardiovascular: Diuretics - Thiazide Failed - 02/20/2023 12:34 PM      Failed - Cr in normal range and within 180 days    Creatinine  Date Value Ref Range Status  12/02/2014 0.97 0.60 - 1.30 mg/dL Final   Creatinine, Ser  Date Value Ref Range Status  01/27/2021 1.13 (H) 0.57 - 1.00 mg/dL Final         Failed - K in normal range and within 180 days    Potassium  Date Value Ref Range Status  01/27/2021 3.5 3.5 - 5.2 mmol/L Final  12/02/2014 4.1 3.5 - 5.1 mmol/L Final         Failed - Na in normal range and within 180 days    Sodium  Date Value Ref Range Status   01/27/2021 143 134 - 144 mmol/L Final  12/02/2014 140 136 - 145 mmol/L Final         Failed - Last BP in normal range    BP Readings from Last 1 Encounters:  12/01/22 (!) 141/78         Failed - Valid encounter within last 6 months    Recent Outpatient Visits           1 year ago Primary hypertension   [place], Dionne Bucy, MD   2 years ago Annual physical exam   [place], Clearnce Sorrel, [place]   2 years ago Essential hypertension   [place], Clearnce Sorrel, [place]   3 years ago Annual physical exam   [place], Anderson Malta M, [place]   3 years ago DDD (degenerative disc disease), lumbar   [place], [place], [place]

## 2023-02-24 ENCOUNTER — Other Ambulatory Visit: Payer: Self-pay | Admitting: Family Medicine

## 2023-02-24 DIAGNOSIS — I10 Essential (primary) hypertension: Secondary | ICD-10-CM

## 2023-02-24 DIAGNOSIS — E7800 Pure hypercholesterolemia, unspecified: Secondary | ICD-10-CM

## 2023-02-24 NOTE — Telephone Encounter (Signed)
Unable to refill per protocol, appointment needed. Duplicate request.  Requested Prescriptions  Pending Prescriptions Disp Refills   atorvastatin (LIPITOR) 20 MG tablet [Pharmacy Med Name: ATORVASTATIN 20 MG TABLET] 30 tablet 0    Sig: TAKE 1 TABLET BY MOUTH EVERYDAY AT BEDTIME     Cardiovascular:  Antilipid - Statins Failed - 02/24/2023  2:22 AM      Failed - Valid encounter within last 12 months    Recent Outpatient Visits           1 year ago Primary hypertension   [place], Dionne Bucy, MD   2 years ago Annual physical exam   [place], Anderson Malta M, [place]   2 years ago Essential hypertension   [place], Anderson Malta M, [place]   3 years ago Annual physical exam   [facility] Fenton Malling M, [place]   3 years ago DDD (degenerative disc disease), lumbar   [place] Fenton Malling M, [place]              Failed - Lipid Panel in normal range within the last 12 months    Cholesterol, Total  Date Value Ref Range Status  01/27/2021 171 100 - 199 mg/dL Final   LDL Chol Calc (NIH)  Date Value Ref Range Status  01/27/2021 96 0 - 99 mg/dL Final   HDL  Date Value Ref Range Status  01/27/2021 43 >39 mg/dL Final   Triglycerides  Date Value Ref Range Status  01/27/2021 186 (H) 0 - 149 mg/dL Final         Passed - Patient is not pregnant       lisinopril (ZESTRIL) 20 MG tablet [Pharmacy Med Name: LISINOPRIL 20 MG TABLET] 30 tablet 0    Sig: TAKE 1 TABLET BY MOUTH EVERY DAY     Cardiovascular:  ACE Inhibitors Failed - 02/24/2023  2:22 AM      Failed - Cr in normal range and within 180 days    Creatinine  Date Value Ref Range Status  12/02/2014 0.97 0.60 - 1.30 mg/dL Final   Creatinine, Ser  Date Value Ref Range Status  01/27/2021 1.13 (H) 0.57 - 1.00 mg/dL Final         Failed - K in normal range and  within 180 days    Potassium  Date Value Ref Range Status  01/27/2021 3.5 3.5 - 5.2 mmol/L Final  12/02/2014 4.1 3.5 - 5.1 mmol/L Final         Failed - Last BP in normal range    BP Readings from Last 1 Encounters:  12/01/22 (!) 141/78         Failed - Valid encounter within last 6 months    Recent Outpatient Visits           1 year ago Primary hypertension   [place], Dionne Bucy, MD   2 years ago Annual physical exam   [place], Clearnce Sorrel, [place]   2 years ago Essential hypertension   [place], Clearnce Sorrel, [place]   3 years ago Annual physical exam   [place], Anderson Malta M, [place]   3 years ago DDD (degenerative disc disease), lumbar   [place], [place], [place]  Passed - Patient is not pregnant       hydrochlorothiazide (HYDRODIURIL) 25 MG tablet [Pharmacy Med Name: HYDROCHLOROTHIAZIDE 25 MG TAB] 30 tablet 0    Sig: TAKE 1 TABLET (25 MG TOTAL) BY MOUTH DAILY.     Cardiovascular: Diuretics - Thiazide Failed - 02/24/2023  2:22 AM      Failed - Cr in normal range and within 180 days    Creatinine  Date Value Ref Range Status  12/02/2014 0.97 0.60 - 1.30 mg/dL Final   Creatinine, Ser  Date Value Ref Range Status  01/27/2021 1.13 (H) 0.57 - 1.00 mg/dL Final         Failed - K in normal range and within 180 days    Potassium  Date Value Ref Range Status  01/27/2021 3.5 3.5 - 5.2 mmol/L Final  12/02/2014 4.1 3.5 - 5.1 mmol/L Final         Failed - Na in normal range and within 180 days    Sodium  Date Value Ref Range Status  01/27/2021 143 134 - 144 mmol/L Final  12/02/2014 140 136 - 145 mmol/L Final         Failed - Last BP in normal range    BP Readings from Last 1 Encounters:  12/01/22 (!) 141/78         Failed - Valid encounter within last 6 months     Recent Outpatient Visits           1 year ago Primary hypertension   [place], Dionne Bucy, MD   2 years ago Annual physical exam   [place], Clearnce Sorrel, [place]   2 years ago Essential hypertension   [place], Clearnce Sorrel, [place]   3 years ago Annual physical exam   [place], Anderson Malta M, [place]   3 years ago DDD (degenerative disc disease), lumbar   [place], [place], [place]

## 2023-03-02 ENCOUNTER — Encounter: Payer: Self-pay | Admitting: Student in an Organized Health Care Education/Training Program

## 2023-03-02 ENCOUNTER — Ambulatory Visit
Payer: BC Managed Care – PPO | Attending: Student in an Organized Health Care Education/Training Program | Admitting: Student in an Organized Health Care Education/Training Program

## 2023-03-02 VITALS — BP 153/54 | HR 67 | Temp 98.2°F | Resp 17 | Ht 61.5 in | Wt 138.0 lb

## 2023-03-02 DIAGNOSIS — G8929 Other chronic pain: Secondary | ICD-10-CM | POA: Diagnosis not present

## 2023-03-02 DIAGNOSIS — M545 Low back pain, unspecified: Secondary | ICD-10-CM | POA: Insufficient documentation

## 2023-03-02 DIAGNOSIS — G894 Chronic pain syndrome: Secondary | ICD-10-CM | POA: Diagnosis not present

## 2023-03-02 DIAGNOSIS — M47816 Spondylosis without myelopathy or radiculopathy, lumbar region: Secondary | ICD-10-CM | POA: Diagnosis not present

## 2023-03-02 MED ORDER — HYDROCODONE-ACETAMINOPHEN 5-325 MG PO TABS: 1.0000 | ORAL_TABLET | Freq: Four times a day (QID) | ORAL | 0 refills | Status: DC | PRN

## 2023-03-02 MED ORDER — HYDROCODONE-ACETAMINOPHEN 5-325 MG PO TABS: 1.0000 | ORAL_TABLET | Freq: Four times a day (QID) | ORAL | 0 refills | Status: AC | PRN

## 2023-03-02 MED ORDER — BELBUCA 450 MCG BU FILM: 1.0000 | ORAL_FILM | Freq: Two times a day (BID) | BUCCAL | 2 refills | Status: DC

## 2023-03-02 NOTE — Progress Notes (Signed)
Nursing Pain Medication Assessment:  Safety precautions to be maintained throughout the outpatient stay will include: orient to surroundings, keep bed in low position, maintain call bell within reach at all times, provide assistance with transfer out of bed and ambulation.  Medication Inspection Compliance: Pill count conducted under aseptic conditions, in front of the patient. Neither the pills nor the bottle was removed from the patient's sight at any time. Once count was completed pills were immediately returned to the patient in their original bottle.  Medication #1: Hydrocodone/APAP Pill/Patch Count:  24 of 120 pills remain Pill/Patch Appearance: Markings consistent with prescribed medication Bottle Appearance: Standard pharmacy container. Clearly labeled. Filled Date: 03 / 07 / 2024 Last Medication intake:  Today  Medication #2: Buprenorphine (Suboxone) Pill/Patch Count: 54 of 60 patches Pill/Patch Appearance: Markings consistent with prescribed medication Bottle Appearance: Standard pharmacy container. Clearly labeled. Filled Date: 03 / 26 / 2024 Last Medication intake:  Today
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
## 2023-03-04 ENCOUNTER — Encounter: Payer: BC Managed Care – PPO | Admitting: Student in an Organized Health Care Education/Training Program

## 2023-03-29 ENCOUNTER — Telehealth: Payer: Self-pay | Admitting: *Deleted

## 2023-03-29 NOTE — Telephone Encounter (Signed)
Attempted to complete PA for Belbuca film on CoverMyMeds, I received the message that "patient cannot be found." Called patient to inquire about her current insurance, she instructed me to not do the PA.

## 2023-05-27 ENCOUNTER — Encounter: Payer: Self-pay | Admitting: Student in an Organized Health Care Education/Training Program

## 2023-05-27 ENCOUNTER — Ambulatory Visit
Payer: BC Managed Care – PPO | Attending: Student in an Organized Health Care Education/Training Program | Admitting: Student in an Organized Health Care Education/Training Program

## 2023-05-27 VITALS — BP 152/73 | HR 71 | Temp 97.2°F | Ht 61.0 in | Wt 138.0 lb

## 2023-05-27 DIAGNOSIS — M47816 Spondylosis without myelopathy or radiculopathy, lumbar region: Secondary | ICD-10-CM

## 2023-05-27 DIAGNOSIS — Z79891 Long term (current) use of opiate analgesic: Secondary | ICD-10-CM

## 2023-05-27 DIAGNOSIS — G894 Chronic pain syndrome: Secondary | ICD-10-CM | POA: Diagnosis not present

## 2023-05-27 DIAGNOSIS — Z0289 Encounter for other administrative examinations: Secondary | ICD-10-CM

## 2023-05-27 MED ORDER — HYDROCODONE-ACETAMINOPHEN 5-325 MG PO TABS: 1.0000 | ORAL_TABLET | Freq: Four times a day (QID) | ORAL | 0 refills | Status: AC | PRN

## 2023-05-27 MED ORDER — BELBUCA 450 MCG BU FILM: 1.0000 | ORAL_FILM | Freq: Two times a day (BID) | BUCCAL | 2 refills | Status: DC

## 2023-05-27 MED ORDER — HYDROCODONE-ACETAMINOPHEN 5-325 MG PO TABS: 1.0000 | ORAL_TABLET | Freq: Four times a day (QID) | ORAL | 0 refills | Status: DC | PRN
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
## 2023-05-27 NOTE — Progress Notes (Signed)
Nursing Pain Medication Assessment:  Safety precautions to be maintained throughout the outpatient stay will include: orient to surroundings, keep bed in low position, maintain call bell within reach at all times, provide assistance with transfer out of bed and ambulation.  Medication Inspection Compliance: Pill count conducted under aseptic conditions, in front of the patient. Neither the pills nor the bottle was removed from the patient's sight at any time. Once count was completed pills were immediately returned to the patient in their original bottle.  Medication #1: Buprenorphine (Suboxone) Pill/Patch Count:  4 of 60 pills remain Pill/Patch Appearance: Markings consistent with prescribed medication Bottle Appearance: Standard pharmacy container. Clearly labeled. Filled Date: 5 / 27 / 2024 Last Medication intake:  Today  Medication #2: Hydrocodone/APAP Pill/Patch Count:  40 of 120 pills remain Pill/Patch Appearance: Markings consistent with prescribed medication Bottle Appearance: Standard pharmacy container. Clearly labeled. Filled Date: 6 / 5 / 2024 Last Medication intake:  YesterdaySafety precautions to be maintained throughout the outpatient stay will include: orient to surroundings, keep bed in low position, maintain call bell within reach at all times, provide assistance with transfer out of bed and ambulation.

## 2023-08-24 ENCOUNTER — Encounter: Payer: Self-pay | Admitting: Student in an Organized Health Care Education/Training Program

## 2023-08-24 ENCOUNTER — Ambulatory Visit
Payer: BC Managed Care – PPO | Attending: Student in an Organized Health Care Education/Training Program | Admitting: Student in an Organized Health Care Education/Training Program

## 2023-08-24 VITALS — BP 179/73 | HR 63 | Temp 97.2°F | Ht 61.0 in | Wt 139.0 lb

## 2023-08-24 DIAGNOSIS — Z0289 Encounter for other administrative examinations: Secondary | ICD-10-CM | POA: Diagnosis not present

## 2023-08-24 DIAGNOSIS — G894 Chronic pain syndrome: Secondary | ICD-10-CM | POA: Diagnosis not present

## 2023-08-24 DIAGNOSIS — Z79891 Long term (current) use of opiate analgesic: Secondary | ICD-10-CM

## 2023-08-24 DIAGNOSIS — M47816 Spondylosis without myelopathy or radiculopathy, lumbar region: Secondary | ICD-10-CM | POA: Diagnosis not present

## 2023-08-24 MED ORDER — HYDROCODONE-ACETAMINOPHEN 5-325 MG PO TABS: 1.0000 | ORAL_TABLET | Freq: Four times a day (QID) | ORAL | 0 refills | Status: DC | PRN

## 2023-08-24 MED ORDER — BELBUCA 450 MCG BU FILM: 1.0000 | ORAL_FILM | Freq: Two times a day (BID) | BUCCAL | 2 refills | Status: DC

## 2023-08-24 MED ORDER — HYDROCODONE-ACETAMINOPHEN 5-325 MG PO TABS: 1.0000 | ORAL_TABLET | Freq: Four times a day (QID) | ORAL | 0 refills | Status: AC | PRN

## 2023-08-24 NOTE — Progress Notes (Signed)
Nursing Pain Medication Assessment:  Safety precautions to be maintained throughout the outpatient stay will include: orient to surroundings, keep bed in low position, maintain call bell within reach at all times, provide assistance with transfer out of bed and ambulation.  Medication Inspection Compliance: Pill count conducted under aseptic conditions, in front of the patient. Neither the pills nor the bottle was removed from the patient's sight at any time. Once count was completed pills were immediately returned to the patient in their original bottle.  Medication #1: Buprenorphine (Suboxone) Pill/Patch Count:  7 of 60 patches remain Pill/Patch Appearance: Markings consistent with prescribed medication Bottle Appearance: Standard pharmacy container. Clearly labeled. Filled Date: 8 / 27 / 2024 Last Medication intake:  Today  Medication #2: Hydrocodone/APAP Pill/Patch Count:  46 of 120 pills remain Pill/Patch Appearance: Markings consistent with prescribed medication Bottle Appearance: Standard pharmacy container. Clearly labeled. Filled Date: 48 / 5 / 2024 Last Medication intake:  TodaySafety precautions to be maintained throughout the outpatient stay will include: orient to surroundings, keep bed in low position, maintain call bell within reach at all times, provide assistance with transfer out of bed and ambulation.
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
## 2023-08-26 DIAGNOSIS — Z23 Encounter for immunization: Secondary | ICD-10-CM | POA: Diagnosis not present

## 2023-10-19 ENCOUNTER — Ambulatory Visit (INDEPENDENT_AMBULATORY_CARE_PROVIDER_SITE_OTHER): Payer: BC Managed Care – PPO | Admitting: Family Medicine

## 2023-10-19 ENCOUNTER — Encounter: Payer: Self-pay | Admitting: Family Medicine

## 2023-10-19 VITALS — BP 180/100 | HR 69 | Ht 61.5 in | Wt 153.0 lb

## 2023-10-19 DIAGNOSIS — I2510 Atherosclerotic heart disease of native coronary artery without angina pectoris: Secondary | ICD-10-CM | POA: Insufficient documentation

## 2023-10-19 DIAGNOSIS — Z0000 Encounter for general adult medical examination without abnormal findings: Secondary | ICD-10-CM | POA: Insufficient documentation

## 2023-10-19 DIAGNOSIS — R7303 Prediabetes: Secondary | ICD-10-CM | POA: Insufficient documentation

## 2023-10-19 DIAGNOSIS — D509 Iron deficiency anemia, unspecified: Secondary | ICD-10-CM | POA: Diagnosis not present

## 2023-10-19 DIAGNOSIS — R600 Localized edema: Secondary | ICD-10-CM

## 2023-10-19 DIAGNOSIS — Z1211 Encounter for screening for malignant neoplasm of colon: Secondary | ICD-10-CM

## 2023-10-19 DIAGNOSIS — I10 Essential (primary) hypertension: Secondary | ICD-10-CM

## 2023-10-19 MED ORDER — ROSUVASTATIN CALCIUM 40 MG PO TABS: 40.0000 mg | ORAL_TABLET | Freq: Every day | ORAL | 0 refills | Status: DC

## 2023-10-19 MED ORDER — HYDROCHLOROTHIAZIDE 12.5 MG PO CAPS: 12.5000 mg | ORAL_CAPSULE | Freq: Every day | ORAL | 0 refills | Status: DC

## 2023-10-19 MED ORDER — LISINOPRIL 20 MG PO TABS: 40.0000 mg | ORAL_TABLET | Freq: Every day | ORAL | 0 refills | Status: DC

## 2023-10-19 NOTE — Progress Notes (Signed)
Established patient visit   Patient: Priscilla Meza   DOB: 1956-05-24   67 y.o. Female  MRN: [id number] Visit Date: 10/19/2023  Today's healthcare provider: Jacky Kindle, FNP Patient presents for new patient visit to establish care.  Introduced to Publishing rights manager role and practice setting.  All questions answered.  Discussed provider/patient relationship and expectations.  Patient is NOT a new patient to practice; but to provider.  Chief Complaint  Patient presents with   New Patient (Initial Visit)   Subjective    HPI HPI   Re-establish Last edited by Shelly Bombard, CMA on 10/19/2023  9:32 AM.      The patient, with a history of hypertension, hyperlipidemia, chronic back pain, anemia, and borderline prediabetes, presents for a re-establishment visit after a long period of not seeing a primary care provider. The patient reports needing refills for blood pressure medication, specifically lisinopril and hydrochlorothiazide. The patient has been managing chronic back pain with the help of a pain management specialist and reports that while she is never without pain, it is a lot better than it was. The patient has not had labs done in a long time and agrees to have them done during this visit. The patient also mentions a history of anemia and borderline prediabetes, but these conditions have been stable for the past few years. The patient works for hospice and has already received the flu and COVID vaccines. The patient is overdue for a colonoscopy and agrees to start with a Cologuard kit at home. The patient also mentions a family history of colon cancer. The patient has not had a Pap smear in many years and has no concerns with pelvic health. The patient also mentions living alone and dealing with the recent loss of her mother, which has been a difficult adjustment.  Medications: Outpatient Medications Prior to Visit  Medication Sig   Buprenorphine HCl (BELBUCA) 450 MCG FILM Place  1 Film (450 mcg total) inside cheek every 12 (twelve) hours.   [START ON 11/03/2023] HYDROcodone-acetaminophen (NORCO/VICODIN) 5-325 MG tablet Take 1 tablet by mouth every 6 (six) hours as needed for severe pain. Must last 30 days.   HYDROcodone-acetaminophen (NORCO/VICODIN) 5-325 MG tablet Take 1 tablet by mouth every 6 (six) hours as needed for severe pain. Must last 30 days. (Patient not taking: Reported on 10/19/2023)   [DISCONTINUED] atorvastatin (LIPITOR) 20 MG tablet TAKE 1 TABLET BY MOUTH EVERYDAY AT BEDTIME (Patient not taking: Reported on 10/19/2023)   [DISCONTINUED] hydrochlorothiazide (HYDRODIURIL) 25 MG tablet TAKE 1 TABLET (25 MG TOTAL) BY MOUTH DAILY. (Patient not taking: Reported on 10/19/2023)   [DISCONTINUED] lisinopril (ZESTRIL) 20 MG tablet TAKE 1 TABLET BY MOUTH EVERY DAY (Patient not taking: Reported on 10/19/2023)   No facility-administered medications prior to visit.     Objective    BP (!) 79/68 (BP Location: Right Arm, Patient Position: Sitting, Cuff Size: Normal)   Pulse 69   Ht 5' 1.5" (1.562 m)   Wt 153 lb (69.4 kg)   SpO2 99%   BMI 28.44 kg/m   Physical Exam Vitals and nursing note reviewed.  Constitutional:      General: She is awake. She is not in acute distress.    Appearance: Normal appearance. She is well-developed, well-groomed and overweight. She is not ill-appearing, toxic-appearing or diaphoretic.  HENT:     Head: Normocephalic and atraumatic.     Jaw: There is normal jaw occlusion. No trismus, tenderness, swelling or pain on  movement.     Right Ear: Hearing, tympanic membrane, ear canal and external ear normal. There is no impacted cerumen.     Left Ear: Hearing, tympanic membrane, ear canal and external ear normal. There is no impacted cerumen.     Nose: Nose normal. No congestion or rhinorrhea.     Right Turbinates: Not enlarged, swollen or pale.     Left Turbinates: Not enlarged, swollen or pale.     Right Sinus: No maxillary sinus tenderness  or frontal sinus tenderness.     Left Sinus: No maxillary sinus tenderness or frontal sinus tenderness.     Mouth/Throat:     Lips: Pink.     Mouth: Mucous membranes are moist. No injury.     Tongue: No lesions.     Pharynx: Oropharynx is clear. Uvula midline. No pharyngeal swelling, oropharyngeal exudate, posterior oropharyngeal erythema or uvula swelling.     Tonsils: No tonsillar exudate or tonsillar abscesses.  Eyes:     General: Lids are normal. Lids are everted, no foreign bodies appreciated. Vision grossly intact. Gaze aligned appropriately. No allergic shiner or visual field deficit.       Right eye: No discharge.        Left eye: No discharge.     Extraocular Movements: Extraocular movements intact.     Conjunctiva/sclera: Conjunctivae normal.     Right eye: Right conjunctiva is not injected. No exudate.    Left eye: Left conjunctiva is not injected. No exudate.    Pupils: Pupils are equal, round, and reactive to light.  Neck:     Thyroid: No thyroid mass, thyromegaly or thyroid tenderness.     Vascular: No carotid bruit.     Trachea: Trachea normal.  Cardiovascular:     Rate and Rhythm: Normal rate and regular rhythm.     Pulses: Normal pulses.          Carotid pulses are 2+ on the right side and 2+ on the left side.      Radial pulses are 2+ on the right side and 2+ on the left side.       Dorsalis pedis pulses are 2+ on the right side and 2+ on the left side.       Posterior tibial pulses are 2+ on the right side and 2+ on the left side.     Heart sounds: Normal heart sounds, S1 normal and S2 normal. No murmur heard.    No friction rub. No gallop.  Pulmonary:     Effort: Pulmonary effort is normal. No respiratory distress.     Breath sounds: Normal breath sounds and air entry. No stridor. No wheezing, rhonchi or rales.  Chest:     Chest wall: No tenderness.  Abdominal:     General: Abdomen is flat. Bowel sounds are normal. There is no distension.     Palpations:  Abdomen is soft. There is no mass.     Tenderness: There is no abdominal tenderness. There is no right CVA tenderness, left CVA tenderness, guarding or rebound.     Hernia: No hernia is present.  Genitourinary:    Comments: Exam deferred; denies complaints Musculoskeletal:        General: No swelling, tenderness, deformity or signs of injury. Normal range of motion.     Cervical back: Full passive range of motion without pain, normal range of motion and neck supple. No edema, rigidity or tenderness. No muscular tenderness.     Right lower leg: No edema.  Left lower leg: No edema.  Lymphadenopathy:     Cervical: No cervical adenopathy.     Right cervical: No superficial, deep or posterior cervical adenopathy.    Left cervical: No superficial, deep or posterior cervical adenopathy.  Skin:    General: Skin is warm and dry.     Capillary Refill: Capillary refill takes less than 2 seconds.     Coloration: Skin is not jaundiced or pale.     Findings: No bruising, erythema, lesion or rash.  Neurological:     General: No focal deficit present.     Mental Status: She is alert and oriented to person, place, and time. Mental status is at baseline.     GCS: GCS eye subscore is 4. GCS verbal subscore is 5. GCS motor subscore is 6.     Sensory: Sensation is intact. No sensory deficit.     Motor: Motor function is intact. No weakness.     Coordination: Coordination is intact. Coordination normal.     Gait: Gait is intact. Gait normal.  Psychiatric:        Attention and Perception: Attention and perception normal.        Mood and Affect: Mood and affect normal.        Speech: Speech normal.        Behavior: Behavior normal. Behavior is cooperative.        Thought Content: Thought content normal.        Cognition and Memory: Cognition and memory normal.        Judgment: Judgment normal.     No results found for any visits on 10/19/23.  Assessment & Plan     Problem List Items Addressed  This Visit       Cardiovascular and Mediastinum   Arteriosclerotic cardiovascular disease (ASCVD)   Uncontrolled hypertension - Primary     Other   Bilateral lower extremity edema   Hypertension Uncontrolled, patient has been without medications for a significant period. Previously on Lisinopril 20mg  and Hydrochlorothiazide 25mg . -Resume Lisinopril 20mg  daily and Hydrochlorothiazide 12.5mg  daily. -Check blood pressure in 2-4 weeks to assess response to treatment.  Hyperlipidemia Previously controlled on Lipitor 20mg , patient has been without medication for a significant period. -Resume Lipitor 20mg  daily. -Check lipid panel today to assess current status.  Chronic Back Pain Managed by pain management specialist. -Continue current pain management plan.  Anemia Stable mild anemia noted in previous labs. -Check CBC and iron panel today to assess current status.  Prediabetes Previously borderline prediabetic, stabilized in recent years. -Check A1C today to assess current status.  Colon Cancer Screening Patient overdue for colonoscopy, family history of colon cancer. -Order Cologuard kit for at-home screening.  General Health Maintenance / Followup Plans -Continue current mammogram schedule, patient to bring results to next appointment. -Check CMP, thyroid panel, and fasting lipid panel today. -Plan follow-up in 2-4 weeks to assess response to hypertension treatment and review lab results.    Leilani Merl, FNP, have reviewed all documentation for this visit. The documentation on 10/19/23 for the exam, diagnosis, procedures, and orders are all accurate and complete.  Jacky Kindle, FNP  [facility] [phone] (phone) [phone] (fax)  [facility]

## 2023-10-20 LAB — CBC WITH DIFFERENTIAL/PLATELET
Basophils Absolute: 0 10*3/uL (ref 0.0–0.2)
Basos: 0 %
EOS (ABSOLUTE): 0.2 10*3/uL (ref 0.0–0.4)
Eos: 2 %
Hematocrit: 38.6 % (ref 34.0–46.6)
Hemoglobin: 12.6 g/dL (ref 11.1–15.9)
Immature Grans (Abs): 0 10*3/uL (ref 0.0–0.1)
Immature Granulocytes: 0 %
Lymphocytes Absolute: 2.6 10*3/uL (ref 0.7–3.1)
Lymphs: 28 %
MCH: 30 pg (ref 26.6–33.0)
MCHC: 32.6 g/dL (ref 31.5–35.7)
MCV: 92 fL (ref 79–97)
Monocytes Absolute: 0.7 10*3/uL (ref 0.1–0.9)
Monocytes: 7 %
Neutrophils Absolute: 5.9 10*3/uL (ref 1.4–7.0)
Neutrophils: 63 %
Platelets: 260 10*3/uL (ref 150–450)
RBC: 4.2 x10E6/uL (ref 3.77–5.28)
RDW: 12.2 % (ref 11.7–15.4)
WBC: 9.4 10*3/uL (ref 3.4–10.8)

## 2023-10-20 LAB — COMPREHENSIVE METABOLIC PANEL
ALT: 8 [IU]/L (ref 0–32)
AST: 14 [IU]/L (ref 0–40)
Albumin: 4 g/dL (ref 3.9–4.9)
Alkaline Phosphatase: 92 [IU]/L (ref 44–121)
BUN/Creatinine Ratio: 20 (ref 12–28)
BUN: 18 mg/dL (ref 8–27)
Bilirubin Total: 0.3 mg/dL (ref 0.0–1.2)
CO2: 23 mmol/L (ref 20–29)
Calcium: 9.3 mg/dL (ref 8.7–10.3)
Chloride: 108 mmol/L — ABNORMAL HIGH (ref 96–106)
Creatinine, Ser: 0.91 mg/dL (ref 0.57–1.00)
Globulin, Total: 3 g/dL (ref 1.5–4.5)
Glucose: 88 mg/dL (ref 70–99)
Potassium: 4.1 mmol/L (ref 3.5–5.2)
Sodium: 142 mmol/L (ref 134–144)
Total Protein: 7 g/dL (ref 6.0–8.5)
eGFR: 70 mL/min/{1.73_m2} (ref >59)

## 2023-10-20 LAB — LIPID PANEL
Chol/HDL Ratio: 4.8 ratio — ABNORMAL HIGH (ref 0.0–4.4)
Cholesterol, Total: 254 mg/dL — ABNORMAL HIGH (ref 100–199)
HDL: 53 mg/dL (ref >39)
LDL Chol Calc (NIH): 176 mg/dL — ABNORMAL HIGH (ref 0–99)
Triglycerides: 140 mg/dL (ref 0–149)
VLDL Cholesterol Cal: 25 mg/dL (ref 5–40)

## 2023-10-20 LAB — IRON,TIBC AND FERRITIN PANEL
Ferritin: 64 ng/mL (ref 15–150)
Iron Saturation: 21 % (ref 15–55)
Iron: 61 ug/dL (ref 27–139)
Total Iron Binding Capacity: 290 ug/dL (ref 250–450)
UIBC: 229 ug/dL (ref 118–369)

## 2023-10-20 LAB — HEMOGLOBIN A1C
Est. average glucose Bld gHb Est-mCnc: 117 mg/dL
Hgb A1c MFr Bld: 5.7 % — ABNORMAL HIGH (ref 4.8–5.6)

## 2023-10-20 LAB — TSH: TSH: 1.44 u[IU]/mL (ref 0.450–4.500)

## 2023-10-23 DIAGNOSIS — Z1211 Encounter for screening for malignant neoplasm of colon: Secondary | ICD-10-CM | POA: Diagnosis not present

## 2023-10-30 LAB — COLOGUARD: COLOGUARD: POSITIVE — AB

## 2023-11-01 ENCOUNTER — Other Ambulatory Visit: Payer: Self-pay | Admitting: Family Medicine

## 2023-11-01 DIAGNOSIS — R195 Other fecal abnormalities: Secondary | ICD-10-CM

## 2023-11-01 NOTE — Progress Notes (Signed)
Positive cologuard- referral placed to GI

## 2023-11-10 ENCOUNTER — Encounter: Payer: Self-pay | Admitting: *Deleted

## 2023-11-18 ENCOUNTER — Ambulatory Visit
Payer: BC Managed Care – PPO | Attending: Student in an Organized Health Care Education/Training Program | Admitting: Student in an Organized Health Care Education/Training Program

## 2023-11-18 ENCOUNTER — Encounter: Payer: Self-pay | Admitting: Student in an Organized Health Care Education/Training Program

## 2023-11-18 VITALS — BP 142/58 | HR 85 | Temp 98.2°F | Resp 16 | Ht 61.0 in | Wt 150.0 lb

## 2023-11-18 DIAGNOSIS — Z79891 Long term (current) use of opiate analgesic: Secondary | ICD-10-CM | POA: Diagnosis not present

## 2023-11-18 DIAGNOSIS — Z0289 Encounter for other administrative examinations: Secondary | ICD-10-CM | POA: Insufficient documentation

## 2023-11-18 DIAGNOSIS — G894 Chronic pain syndrome: Secondary | ICD-10-CM | POA: Diagnosis not present

## 2023-11-18 DIAGNOSIS — M47816 Spondylosis without myelopathy or radiculopathy, lumbar region: Secondary | ICD-10-CM | POA: Diagnosis not present

## 2023-11-18 MED ORDER — HYDROCODONE-ACETAMINOPHEN 5-325 MG PO TABS: 1.0000 | ORAL_TABLET | Freq: Four times a day (QID) | ORAL | 0 refills | Status: DC | PRN

## 2023-11-18 MED ORDER — BELBUCA 300 MCG BU FILM: 1.0000 | ORAL_FILM | Freq: Two times a day (BID) | BUCCAL | 3 refills | Status: AC

## 2023-11-18 MED ORDER — HYDROCODONE-ACETAMINOPHEN 5-325 MG PO TABS: 1.0000 | ORAL_TABLET | Freq: Four times a day (QID) | ORAL | 0 refills | Status: AC | PRN

## 2023-11-18 NOTE — Progress Notes (Signed)
Nursing Pain Medication Assessment:  Safety precautions to be maintained throughout the outpatient stay will include: orient to surroundings, keep bed in low position, maintain call bell within reach at all times, provide assistance with transfer out of bed and ambulation.  Medication Inspection Compliance: Pill count conducted under aseptic conditions, in front of the patient. Neither the pills nor the bottle was removed from the patient's sight at any time. Once count was completed pills were immediately returned to the patient in their original bottle.  Medication: Hydrocodone/APAP Pill/Patch Count:  70 of 120 pills remain Pill/Patch Appearance: Markings consistent with prescribed medication Bottle Appearance: Standard pharmacy container. Clearly labeled. Filled Date: 52 / 03 / 2025 Last Medication intake:  TodaySafety precautions to be maintained throughout the outpatient stay will include: orient to surroundings, keep bed in low position, maintain call bell within reach at all times, provide assistance with transfer out of bed and ambulation.

## 2023-11-18 NOTE — Progress Notes (Signed)
PROVIDER NOTE: Information contained herein reflects review and annotations entered in association with encounter. Interpretation of such information and data should be left to medically-trained personnel. Information provided to patient can be located elsewhere in the medical record under "Patient Instructions". Document created using STT-dictation technology, any transcriptional errors that may result from process are unintentional.    Patient: Priscilla Meza  Service Category: E/M  Provider: Edward Jolly, MD  DOB: 11-Oct-1956  DOS: 11/18/2023  Referring Provider: Jacky Kindle, FNP  MRN: [id number]  Specialty: Interventional Pain Management  PCP: Jacky Kindle, FNP  Type: Established Patient  Setting: Ambulatory outpatient    Location: Office  Delivery: Face-to-face     HPI  Priscilla Meza, a 67 y.o. year old female, is here today because of her Lumbar facet arthropathy [M47.816]. Priscilla Meza primary complain today is Back Pain (lower)  Pertinent problems: Priscilla Meza has Chronic bilateral low back pain without sciatica; Chronic narcotic use; Chronic pain syndrome; Spinal stenosis, lumbar region, with neurogenic claudication; Lumbar facet arthropathy; Encounter for long-term opiate analgesic use; Lumbar radiculopathy; and Pain management contract signed on their pertinent problem list. Pain Assessment: Severity of Chronic pain is reported as a 2 /10. Location: Back Lower/denies. Onset: More than a month ago. Quality: Aching. Timing: Intermittent. Modifying factor(s): Hydrocodone, lying down. Vitals:  height is 5\' 1"  (1.549 m) and weight is 150 lb (68 kg). Her temporal temperature is 98.2 F (36.8 C). Her blood pressure is 142/58 (abnormal) and her pulse is 85. Her respiration is 16 and oxygen saturation is 100%.  BMI: Estimated body mass index is 28.34 kg/m as calculated from the following:   Height as of this encounter: 5\' 1"  (1.549 m).   Weight as of this encounter: 150 lb (68 kg). Last  encounter: 08/24/2023. Last procedure: Visit date not found.  Reason for encounter: medication management.   Discussed the use of AI scribe software for clinical note transcription with the patient, who gave verbal consent to proceed.  History of Present Illness   The patient, a 67 year old with a history of chronic pain, has been managing their symptoms with Belbuca, a medication that has proven effective. However, due to financial constraints, the patient has been struggling to afford the medication, which costs them approximately $800 per month. The patient has attempted to use Butrans patches as an alternative, but after a couple of months, they developed a severe itching reaction to the patches. The patient has also been prescribed hydrocodone, which they continue to take.  The patient has expressed a desire to wean off the Belbuca due to its cost, and is considering trying tramadol as an alternative, despite previous negative experiences with the medication. They have also been attempting to reduce their reliance on opioids by using over-the-counter Tylenol for pain management.  The patient has been making efforts to increase their physical activity, including using a pedal machine at their desk and performing basic exercises. They have also expressed a desire to reduce their reliance on opioids, citing a long history of use and a fear of withdrawal symptoms. The patient is motivated to manage their pain in a more sustainable and affordable way.       Pharmacotherapy Assessment  Analgesic: Belbuca 450-->300 mcg every 12 hours, hydrocodone 10 mg every 6 hours as needed   Monitoring: [place] PMP: PDMP not reviewed this encounter.       Pharmacotherapy: No side-effects or adverse reactions reported. Compliance: No problems identified. Effectiveness: Clinically acceptable.  Priscilla Elk, RN  11/18/2023  7:59 AM  Sign when Signing Visit Nursing Pain Medication Assessment:  Safety  precautions to be maintained throughout the outpatient stay will include: orient to surroundings, keep bed in low position, maintain call bell within reach at all times, provide assistance with transfer out of bed and ambulation.  Medication Inspection Compliance: Pill count conducted under aseptic conditions, in front of the patient. Neither the pills nor the bottle was removed from the patient's sight at any time. Once count was completed pills were immediately returned to the patient in their original bottle.  Medication: Hydrocodone/APAP Pill/Patch Count:  70 of 120 pills remain Pill/Patch Appearance: Markings consistent with prescribed medication Bottle Appearance: Standard pharmacy container. Clearly labeled. Filled Date: 57 / 03 / 2025 Last Medication intake:  TodaySafety precautions to be maintained throughout the outpatient stay will include: orient to surroundings, keep bed in low position, maintain call bell within reach at all times, provide assistance with transfer out of bed and ambulation.     No results found for: "CBDTHCR" No results found for: "D8THCCBX" No results found for: "D9THCCBX"  UDS:  Summary  Date Value Ref Range Status  12/01/2022 Note  Final    Comment:    ==================================================================== [employer] Select 13 (MW) ==================================================================== Test                             Result       Flag       Units  Drug Present and Declared for Prescription Verification   Hydrocodone                    1732         EXPECTED   ng/mg creat   Hydromorphone                  317          EXPECTED   ng/mg creat   Dihydrocodeine                 500          EXPECTED   ng/mg creat   Norhydrocodone                 3552         EXPECTED   ng/mg creat    Sources of hydrocodone include scheduled prescription medications.    Hydromorphone, dihydrocodeine and norhydrocodone are expected    metabolites of  hydrocodone. Hydromorphone and dihydrocodeine are    also available as scheduled prescription medications.    Buprenorphine                  40           EXPECTED   ng/mg creat   Norbuprenorphine               113          EXPECTED   ng/mg creat    Source of buprenorphine is a scheduled prescription medication.    Norbuprenorphine is an expected metabolite of buprenorphine.  ==================================================================== Test                      Result    Flag   Units      Ref Range   Creatinine              60  mg/dL      >=16 ==================================================================== Declared Medications:  The flagging and interpretation on this report are based on the  following declared medications.  Unexpected results may arise from  inaccuracies in the declared medications.   **Note: The testing scope of this panel includes these medications:   Hydrocodone (Norco)   **Note: The testing scope of this panel does not include small to  moderate amounts of these reported medications:   Buprenorphine (Belbuca)   **Note: The testing scope of this panel does not include the  following reported medications:   Acetaminophen (Norco)  Atorvastatin (Lipitor)  Hydrochlorothiazide  Lisinopril (Zestril) ==================================================================== For clinical consultation, please call [phone]. ====================================================================       ROS  Constitutional: Denies any fever or chills Gastrointestinal: No reported hemesis, hematochezia, vomiting, or acute GI distress Musculoskeletal: Denies any acute onset joint swelling, redness, loss of ROM, or weakness Neurological: No reported episodes of acute onset apraxia, aphasia, dysarthria, agnosia, amnesia, paralysis, loss of coordination, or loss of consciousness  Medication Review  Buprenorphine HCl, HYDROcodone-acetaminophen,  hydrochlorothiazide, lisinopril, and rosuvastatin  History Review  Allergy: Priscilla Meza has no known allergies. Drug: Priscilla Meza  reports no history of drug use. Alcohol:  reports no history of alcohol use. Tobacco:  reports that she has quit smoking. She has never used smokeless tobacco. Social: Priscilla Meza  reports that she has quit smoking. She has never used smokeless tobacco. She reports that she does not drink alcohol and does not use drugs. Medical:  has a past medical history of Frequent headaches, GERD (gastroesophageal reflux disease), Hypertension, and Urine incontinence. Surgical: Priscilla Meza  has a past surgical history that includes Abdominal hysterectomy (1985) and Tubal ligation. Family: family history includes Breast cancer in her maternal aunt; Colon cancer in her maternal grandmother.  Laboratory Chemistry Profile   Renal Lab Results  Component Value Date   BUN 18 10/19/2023   CREATININE 0.91 10/19/2023   BCR 20 10/19/2023   GFRAA 72 01/04/2020   GFRNONAA 62 01/04/2020    Hepatic Lab Results  Component Value Date   AST 14 10/19/2023   ALT 8 10/19/2023   ALBUMIN 4.0 10/19/2023   ALKPHOS 92 10/19/2023   LIPASE 137 12/01/2014    Electrolytes Lab Results  Component Value Date   NA 142 10/19/2023   K 4.1 10/19/2023   CL 108 (H) 10/19/2023   CALCIUM 9.3 10/19/2023    Bone No results found for: "VD25OH", "VD125OH2TOT", "WJ1914NW2", "NF6213YQ6", "25OHVITD1", "25OHVITD2", "25OHVITD3", "TESTOFREE", "TESTOSTERONE"  Inflammation (CRP: Acute Phase) (ESR: Chronic Phase) No results found for: "CRP", "ESRSEDRATE", "LATICACIDVEN"       Note: Above Lab results reviewed.  Recent Imaging Review  DG Lumbar Spine Complete W/Bend CLINICAL DATA:  Low back pain.  EXAM: LUMBAR SPINE - COMPLETE WITH BENDING VIEWS  COMPARISON:  None.  FINDINGS: 5 nonrib bearing lumbar-type vertebral bodies.  Vertebral body heights are maintained. No acute fracture. Generalized  osteopenia.  Minimal grade 1 anterolisthesis of L4 on L5 and L5 on S1 secondary to facet disease. Minimal retrolisthesis of L2 on L3. No dynamic listhesis. No spondylolysis.  Degenerative disease with mild disc height loss at T12-L1, L1-2, L2-3 and L4-5. Degenerative disease with disc height loss at L5-S1. Bilateral facet arthropathy at L4-5 and L5-S1.  SI joints are unremarkable.  Abdominal aortic atherosclerosis.  IMPRESSION: 1. Lumbar spine spondylosis as described above. 2. No acute osseous injury of the lumbar spine.  Electronically Signed   By: Dillard Cannon.D.  On: 12/27/2021 07:26 Note: Reviewed        Physical Exam  General appearance: Well nourished, well developed, and well hydrated. In no apparent acute distress Mental status: Alert, oriented x 3 (person, place, & time)       Respiratory: No evidence of acute respiratory distress Eyes: PERLA Vitals: BP (!) 142/58 (Cuff Size: Normal)   Pulse 85   Temp 98.2 F (36.8 C) (Temporal)   Resp 16   Ht 5\' 1"  (1.549 m)   Wt 150 lb (68 kg)   SpO2 100%   BMI 28.34 kg/m  BMI: Estimated body mass index is 28.34 kg/m as calculated from the following:   Height as of this encounter: 5\' 1"  (1.549 m).   Weight as of this encounter: 150 lb (68 kg). Ideal: Ideal body weight: 47.8 kg (105 lb 6.1 oz) Adjusted ideal body weight: 55.9 kg (123 lb 3.7 oz)  Assessment   Diagnosis Status  1. Lumbar facet arthropathy   2. Lumbar spondylosis   3. Chronic pain syndrome   4. Pain management contract signed   5. Encounter for long-term opiate analgesic use    Controlled Controlled Controlled   Updated Problems: No problems updated.  Plan of Care  Problem-specific:  Assessment and Plan    Chronic Pain Management They are experiencing chronic pain and have found Belbuca (buprenorphine) film effective but are seeking alternatives due to high costs and lack of insurance coverage. Previous use of Butrans (buprenorphine) patch  caused severe itching, and the efficacy of Tramadol remains unknown. They are motivated to reduce opioid use due to financial constraints and long-term health considerations, with a preference to avoid withdrawal symptoms and increase activity. We will reduce the Belbuca dose to 300 mcg every 12 hours for three months and provide three refills. Hydrocodone 5 mg will continue as needed, along with the encouragement to use Tylenol 8-hour muscle as needed. If the 300 mcg dose of Belbuca proves ineffective, we will adjust it back to 450 mcg.  Hypertension They have resumed antihypertensive medication, resulting in improved blood pressure control. We will continue the current antihypertensive medication.  General Health Maintenance They are encouraged to increase physical activity to aid in overall health and pain management, especially since they work from home with limited physical activity. We advise regular physical activity, including using a pedal exerciser and performing basic exercises like squats and knee lifts, and to stand and stretch regularly while working from home.  Follow-up A follow-up appointment is scheduled in four months, with an instruction to call if medication adjustment is needed before the next appointment.       Priscilla Meza has a current medication list which includes the following long-term medication(s): hydrochlorothiazide, lisinopril, and rosuvastatin.  Pharmacotherapy (Medications Ordered): Meds ordered this encounter  Medications   Buprenorphine HCl (BELBUCA) 300 MCG FILM    Sig: Place 1 Film inside cheek every 12 (twelve) hours.    Dispense:  60 Film    Refill:  3    Chronic Pain: STOP Act (Not applicable) Fill 1 day early if closed on refill date. Avoid benzodiazepines within 8 hours of opioids   HYDROcodone-acetaminophen (NORCO/VICODIN) 5-325 MG tablet    Sig: Take 1 tablet by mouth every 6 (six) hours as needed for severe pain (pain score 7-10). Must last  30 days.    Dispense:  120 tablet    Refill:  0    Chronic Pain: STOP Act (Not applicable) Fill 1 day early if  closed on refill date. Avoid benzodiazepines within 8 hours of opioids   HYDROcodone-acetaminophen (NORCO/VICODIN) 5-325 MG tablet    Sig: Take 1 tablet by mouth every 6 (six) hours as needed for severe pain (pain score 7-10). Must last 30 days.    Dispense:  120 tablet    Refill:  0    Chronic Pain: STOP Act (Not applicable) Fill 1 day early if closed on refill date. Avoid benzodiazepines within 8 hours of opioids   HYDROcodone-acetaminophen (NORCO/VICODIN) 5-325 MG tablet    Sig: Take 1 tablet by mouth every 6 (six) hours as needed for severe pain (pain score 7-10). Must last 30 days.    Dispense:  120 tablet    Refill:  0    Chronic Pain: STOP Act (Not applicable) Fill 1 day early if closed on refill date. Avoid benzodiazepines within 8 hours of opioids   Orders:  No orders of the defined types were placed in this encounter.  Follow-up plan:   Return in about 4 months (around 03/18/2024) for MM, F2F.      Recent Visits Date Type Provider Dept  08/24/23 Office Visit Edward Jolly, MD [facility]  Showing recent visits within past 90 days and meeting all other requirements Today's Visits Date Type Provider Dept  11/18/23 Office Visit Edward Jolly, MD [facility]  Showing today's visits and meeting all other requirements Future Appointments No visits were found meeting these conditions. Showing future appointments within next 90 days and meeting all other requirements  I discussed the assessment and treatment plan with the patient. The patient was provided an opportunity to ask questions and all were answered. The patient agreed with the plan and demonstrated an understanding of the instructions.  Patient advised to call back or seek an in-person evaluation if the symptoms or condition worsens.  Duration of encounter: .  Total time on  encounter, as per AMA guidelines included both the face-to-face and non-face-to-face time personally spent by the physician and/or other qualified health care professional(s) on the day of the encounter (includes time in activities that require the physician or other qualified health care professional and does not include time in activities normally performed by clinical staff). Physician's time may include the following activities when performed: Preparing to see the patient (e.g., pre-charting review of records, searching for previously ordered imaging, lab work, and nerve conduction tests) Review of prior analgesic pharmacotherapies. Reviewing PMP Interpreting ordered tests (e.g., lab work, imaging, nerve conduction tests) Performing post-procedure evaluations, including interpretation of diagnostic procedures Obtaining and/or reviewing separately obtained history Performing a medically appropriate examination and/or evaluation Counseling and educating the patient/family/caregiver Ordering medications, tests, or procedures Referring and communicating with other health care professionals (when not separately reported) Documenting clinical information in the electronic or other health record Independently interpreting results (not separately reported) and communicating results to the patient/ family/caregiver Care coordination (not separately reported)  Note by: Edward Jolly, MD Date: 11/18/2023; Time: 8:36 AM

## 2023-11-19 ENCOUNTER — Ambulatory Visit (INDEPENDENT_AMBULATORY_CARE_PROVIDER_SITE_OTHER): Payer: BC Managed Care – PPO | Admitting: Family Medicine

## 2023-11-19 ENCOUNTER — Encounter: Payer: Self-pay | Admitting: Family Medicine

## 2023-11-19 VITALS — BP 142/68 | HR 72 | Ht 61.5 in | Wt 154.0 lb

## 2023-11-19 DIAGNOSIS — I10 Essential (primary) hypertension: Secondary | ICD-10-CM | POA: Diagnosis not present

## 2023-11-19 NOTE — Assessment & Plan Note (Signed)
Chronic, improved. BP from 180/100-140/50-60s Continue  -hydrochlorothiazide 12.5 -zestril 40 mg  The 10-year ASCVD risk score (Arnett DK, et al., 2019) is: 10.5%

## 2023-11-19 NOTE — Progress Notes (Signed)
Established patient visit   Patient: Priscilla Meza   DOB: [birth year]   67 y.o. Female  MRN: [id number] Visit Date: 11/19/2023  Today's healthcare provider: Jacky Kindle, FNP  Introduced to nurse practitioner role and practice setting.  All questions answered.  Discussed provider/patient relationship and expectations.  Chief Complaint  Patient presents with   Follow-up    4 weeks f/u B/p   Subjective    HPI HPI     Follow-up    Additional comments: 4 weeks f/u B/p      Last edited by Shelly Bombard, CMA on 11/19/2023  8:57 AM.      Hypertension, follow-up  BP Readings from Last 3 Encounters:  11/19/23 (!) 142/68  11/18/23 (!) 142/58  10/19/23 (!) 180/100   Wt Readings from Last 3 Encounters:  11/19/23 154 lb (69.9 kg)  11/18/23 150 lb (68 kg)  10/19/23 153 lb (69.4 kg)     She was last seen for hypertension 4 weeks ago.  BP at that visit was 180/100. Management since that visit includes medication titration. -hydrochlorothiazide 12.5 -zestril 40 mg   She reports good compliance with treatment. She is not having side effects.  She is following a Regular diet. She is not exercising. She does not smoke.  Use of agents associated with hypertension: none.   Outside blood pressures are not being checked. Symptoms: No chest pain No chest pressure  No palpitations No syncope  No dyspnea No orthopnea  No paroxysmal nocturnal dyspnea No lower extremity edema   Pertinent labs Lab Results  Component Value Date   CHOL 254 (H) 10/19/2023   HDL 53 10/19/2023   LDLCALC 176 (H) 10/19/2023   TRIG 140 10/19/2023   CHOLHDL 4.8 (H) 10/19/2023   Lab Results  Component Value Date   NA 142 10/19/2023   K 4.1 10/19/2023   CREATININE 0.91 10/19/2023   EGFR 70 10/19/2023   GLUCOSE 88 10/19/2023   TSH 1.440 10/19/2023     The 10-year ASCVD risk score (Arnett DK, et al., 2019) is:  10.5%  ---------------------------------------------------------------------------------------------------   Medications: Outpatient Medications Prior to Visit  Medication Sig   Buprenorphine HCl (BELBUCA) 300 MCG FILM Place 1 Film inside cheek every 12 (twelve) hours.   hydrochlorothiazide (MICROZIDE) 12.5 MG capsule Take 1 capsule (12.5 mg total) by mouth daily.   [START ON 12/03/2023] HYDROcodone-acetaminophen (NORCO/VICODIN) 5-325 MG tablet Take 1 tablet by mouth every 6 (six) hours as needed for severe pain (pain score 7-10). Must last 30 days.   [START ON 01/02/2024] HYDROcodone-acetaminophen (NORCO/VICODIN) 5-325 MG tablet Take 1 tablet by mouth every 6 (six) hours as needed for severe pain (pain score 7-10). Must last 30 days.   [START ON 02/01/2024] HYDROcodone-acetaminophen (NORCO/VICODIN) 5-325 MG tablet Take 1 tablet by mouth every 6 (six) hours as needed for severe pain (pain score 7-10). Must last 30 days.   lisinopril (ZESTRIL) 20 MG tablet Take 2 tablets (40 mg total) by mouth daily.   rosuvastatin (CRESTOR) 40 MG tablet Take 1 tablet (40 mg total) by mouth daily.   No facility-administered medications prior to visit.    Review of Systems Last CBC Lab Results  Component Value Date   WBC 9.4 10/19/2023   HGB 12.6 10/19/2023   HCT 38.6 10/19/2023   MCV 92 10/19/2023   MCH 30.0 10/19/2023   RDW 12.2 10/19/2023   PLT 260 10/19/2023   Last metabolic panel Lab Results  Component Value Date  GLUCOSE 88 10/19/2023   NA 142 10/19/2023   K 4.1 10/19/2023   CL 108 (H) 10/19/2023   CO2 23 10/19/2023   BUN 18 10/19/2023   CREATININE 0.91 10/19/2023   EGFR 70 10/19/2023   CALCIUM 9.3 10/19/2023   PROT 7.0 10/19/2023   ALBUMIN 4.0 10/19/2023   LABGLOB 3.0 10/19/2023   AGRATIO 1.4 01/27/2021   BILITOT 0.3 10/19/2023   ALKPHOS 92 10/19/2023   AST 14 10/19/2023   ALT 8 10/19/2023   ANIONGAP 6 (L) 12/02/2014   Last lipids Lab Results  Component Value Date   CHOL 254  (H) 10/19/2023   HDL 53 10/19/2023   LDLCALC 176 (H) 10/19/2023   TRIG 140 10/19/2023   CHOLHDL 4.8 (H) 10/19/2023   Last hemoglobin A1c Lab Results  Component Value Date   HGBA1C 5.7 (H) 10/19/2023   Last thyroid functions Lab Results  Component Value Date   TSH 1.440 10/19/2023   Last vitamin D No results found for: "25OHVITD2", "25OHVITD3", "VD25OH" Last vitamin B12 and Folate No results found for: "VITAMINB12", "FOLATE"    Objective    BP (!) 142/68 (BP Location: Right Arm, Patient Position: Sitting, Cuff Size: Normal)   Pulse 72   Ht 5' 1.5" (1.562 m)   Wt 154 lb (69.9 kg)   SpO2 97%   BMI 28.63 kg/m   BP Readings from Last 3 Encounters:  11/19/23 (!) 142/68  11/18/23 (!) 142/58  10/19/23 (!) 180/100   Wt Readings from Last 3 Encounters:  11/19/23 154 lb (69.9 kg)  11/18/23 150 lb (68 kg)  10/19/23 153 lb (69.4 kg)   SpO2 Readings from Last 3 Encounters:  11/19/23 97%  11/18/23 100%  10/19/23 99%   Physical Exam Vitals and nursing note reviewed.  Constitutional:      General: She is not in acute distress.    Appearance: Normal appearance. She is overweight. She is not ill-appearing, toxic-appearing or diaphoretic.  HENT:     Head: Normocephalic and atraumatic.  Cardiovascular:     Rate and Rhythm: Normal rate and regular rhythm.     Pulses: Normal pulses.     Heart sounds: Normal heart sounds. No murmur heard.    No friction rub. No gallop.  Pulmonary:     Effort: Pulmonary effort is normal. No respiratory distress.     Breath sounds: Normal breath sounds. No stridor. No wheezing, rhonchi or rales.  Chest:     Chest wall: No tenderness.  Musculoskeletal:        General: No swelling, tenderness, deformity or signs of injury. Normal range of motion.     Right lower leg: No edema.     Left lower leg: No edema.  Skin:    General: Skin is warm and dry.     Capillary Refill: Capillary refill takes less than 2 seconds.     Coloration: Skin is not  jaundiced or pale.     Findings: No bruising, erythema, lesion or rash.  Neurological:     General: No focal deficit present.     Mental Status: She is alert and oriented to person, place, and time. Mental status is at baseline.     Cranial Nerves: No cranial nerve deficit.     Sensory: No sensory deficit.     Motor: No weakness.     Coordination: Coordination normal.  Psychiatric:        Mood and Affect: Mood normal.        Behavior: Behavior normal.  Thought Content: Thought content normal.        Judgment: Judgment normal.     No results found for any visits on 11/19/23.  Assessment & Plan     Problem List Items Addressed This Visit       Cardiovascular and Mediastinum   Uncontrolled hypertension - Primary   Chronic, improved. BP from 180/100-140/50-60s Continue  -hydrochlorothiazide 12.5 -zestril 40 mg  The 10-year ASCVD risk score (Arnett DK, et al., 2019) is: 10.5%       Return in about 5 months (around 04/18/2024) for T2DM management, chonic disease management.     Leilani Merl, FNP, have reviewed all documentation for this visit. The documentation on 11/19/23 for the exam, diagnosis, procedures, and orders are all accurate and complete.  Jacky Kindle, FNP  [facility] [phone] (phone) [phone] (fax)  [facility]

## 2023-12-01 ENCOUNTER — Ambulatory Visit
Admission: EM | Admit: 2023-12-01 | Discharge: 2023-12-01 | Disposition: A | Discharge status: Home / Self Care | Payer: BC Managed Care – PPO | Attending: Emergency Medicine | Admitting: Emergency Medicine

## 2023-12-01 DIAGNOSIS — J0100 Acute maxillary sinusitis, unspecified: Secondary | ICD-10-CM | POA: Diagnosis not present

## 2023-12-01 DIAGNOSIS — H6691 Otitis media, unspecified, right ear: Secondary | ICD-10-CM | POA: Diagnosis not present

## 2023-12-01 MED ORDER — AMOXICILLIN-POT CLAVULANATE 875-125 MG PO TABS: 1.0000 | ORAL_TABLET | Freq: Two times a day (BID) | ORAL | 0 refills | Status: DC

## 2023-12-01 NOTE — Discharge Instructions (Addendum)
 Take the Augmentin as directed.  Follow up with your primary care provider if your symptoms are not improving.

## 2023-12-01 NOTE — ED Triage Notes (Signed)
 Patient to Urgent Care with complaints of right sided ear pain/ fullness.   Symptoms started 11 days ago/ URI symptoms.  Meds: alker seltzer/ mucinex/ nasal spray.

## 2023-12-01 NOTE — ED Provider Notes (Signed)
 [facility]    CSN: [id number] Arrival date & time: 12/01/23  9048      History   Chief Complaint Chief Complaint  Patient presents with   Otalgia    HPI Priscilla Meza is a 68 y.o. female.  Patient presents with 1 day history of right ear pain.  Both ears feel stopped up.  She has had sinus pressure, congestion, mild cough x 11 days.  No fever or shortness of breath.  She has been treating her symptoms with Mucinex, Alka-Seltzer, nasal spray.  Patient's medical history includes uncontrolled hypertension, borderline diabetes, degenerative disc disease, chronic back pain, chronic pain syndrome.  The history is provided by the patient and medical records.    Past Medical History:  Diagnosis Date   Frequent headaches    GERD (gastroesophageal reflux disease)    Hypertension    Urine incontinence     Patient Active Problem List   Diagnosis Date Noted   Uncontrolled hypertension 10/19/2023   Arteriosclerotic cardiovascular disease (ASCVD) 10/19/2023   Bilateral lower extremity edema 10/19/2023   Borderline diabetes 10/19/2023   Screen for colon cancer 10/19/2023   Annual physical exam 10/19/2023   Pain management contract signed 01/08/2022   Chronic pain syndrome 12/25/2021   Spinal stenosis, lumbar region, with neurogenic claudication 12/25/2021   Lumbar facet arthropathy 12/25/2021   Encounter for long-term opiate analgesic use 12/25/2021   Lumbar radiculopathy 12/25/2021   Overweight 07/17/2021   Chronic narcotic use 06/07/2019   Family history of colon cancer 12/06/2018   Anemia 05/30/2015   Acid reflux 05/30/2015   Hypertension 05/30/2015   Hypercholesteremia 05/30/2015   Urinary incontinence 05/30/2015   Chronic bilateral low back pain without sciatica 05/30/2015   DDD (degenerative disc disease), lumbar 05/30/2015   Systolic murmur 05/30/2015   Gastrointestinal bleeding, upper 01/05/2015    Past Surgical History:  Procedure Laterality Date    ABDOMINAL HYSTERECTOMY  1985   TUBAL LIGATION      OB History   No obstetric history on file.      Home Medications    Prior to Admission medications   Medication Sig Start Date End Date Taking? Authorizing Provider  amoxicillin -clavulanate (AUGMENTIN ) 875-125 MG tablet Take 1 tablet by mouth every 12 (twelve) hours. 12/01/23  Yes Corlis Burnard DEL, NP  Buprenorphine  HCl (BELBUCA ) 300 MCG FILM Place 1 Film inside cheek every 12 (twelve) hours. 11/18/23 12/18/23  Marcelino Nurse, MD  hydrochlorothiazide  (MICROZIDE ) 12.5 MG capsule Take 1 capsule (12.5 mg total) by mouth daily. 10/19/23   Emilio Yaileen Hofferber DASEN, FNP  HYDROcodone -acetaminophen  (NORCO/VICODIN) 5-325 MG tablet Take 1 tablet by mouth every 6 (six) hours as needed for severe pain (pain score 7-10). Must last 30 days. 12/03/23 01/02/24  Marcelino Nurse, MD  HYDROcodone -acetaminophen  (NORCO/VICODIN) 5-325 MG tablet Take 1 tablet by mouth every 6 (six) hours as needed for severe pain (pain score 7-10). Must last 30 days. 01/02/24 02/01/24  Lateef, Bilal, MD  HYDROcodone -acetaminophen  (NORCO/VICODIN) 5-325 MG tablet Take 1 tablet by mouth every 6 (six) hours as needed for severe pain (pain score 7-10). Must last 30 days. 02/01/24 03/02/24  Marcelino Nurse, MD  lisinopril  (ZESTRIL ) 20 MG tablet Take 2 tablets (40 mg total) by mouth daily. 10/19/23   Emilio Culver Feighner DASEN, FNP  rosuvastatin  (CRESTOR ) 40 MG tablet Take 1 tablet (40 mg total) by mouth daily. 10/19/23   Emilio Dellis Voght DASEN, FNP    Family History Family History  Problem Relation Age of Onset   Breast cancer  Maternal Aunt    Colon cancer Maternal Grandmother     Social History Social History   Tobacco Use   Smoking status: Former   Smokeless tobacco: Never   Tobacco comments:    quit in 12/2013  Vaping Use   Vaping status: Every Day  Substance Use Topics   Alcohol use: No    Alcohol/week: 0.0 standard drinks of alcohol   Drug use: No     Allergies   Patient has no known allergies.   Review of  Systems Review of Systems  Constitutional:  Negative for chills and fever.  HENT:  Positive for congestion, ear pain, postnasal drip, rhinorrhea and sinus pressure. Negative for ear discharge and sore throat.   Respiratory:  Positive for cough. Negative for shortness of breath.      Physical Exam Triage Vital Signs ED Triage Vitals  Encounter Vitals Group     BP 12/01/23 1027 138/81     Systolic BP Percentile --      Diastolic BP Percentile --      Pulse Rate 12/01/23 1027 91     Resp 12/01/23 1027 18     Temp 12/01/23 1027 98.5 F (36.9 C)     Temp src --      SpO2 12/01/23 1027 96 %     Weight --      Height --      Head Circumference --      Peak Flow --      Pain Score 12/01/23 1014 7     Pain Loc --      Pain Education --      Exclude from Growth Chart --    No data found.  Updated Vital Signs BP 138/81   Pulse 91   Temp 98.5 F (36.9 C)   Resp 18   SpO2 96%   Visual Acuity Right Eye Distance:   Left Eye Distance:   Bilateral Distance:    Right Eye Near:   Left Eye Near:    Bilateral Near:     Physical Exam Constitutional:      General: She is not in acute distress. HENT:     Right Ear: Tympanic membrane is erythematous.     Left Ear: Tympanic membrane normal.     Ears:     Comments: Right TM is brightly erythematous.    Nose: Congestion and rhinorrhea present.     Mouth/Throat:     Mouth: Mucous membranes are moist.     Pharynx: Oropharynx is clear.  Cardiovascular:     Rate and Rhythm: Normal rate and regular rhythm.     Heart sounds: Normal heart sounds.  Pulmonary:     Effort: Pulmonary effort is normal. No respiratory distress.     Breath sounds: Normal breath sounds.  Neurological:     Mental Status: She is alert.      UC Treatments / Results  Labs (all labs ordered are listed, but only abnormal results are displayed) Labs Reviewed - No data to display  EKG   Radiology No results found.  Procedures Procedures (including  critical care time)  Medications Ordered in UC Medications - No data to display  Initial Impression / Assessment and Plan / UC Course  I have reviewed the triage vital signs and the nursing notes.  Pertinent labs & imaging results that were available during my care of the patient were reviewed by me and considered in my medical decision making (see chart for details).  Right otitis media, acute sinusitis.  Treating with Augmentin .  Tylenol  or ibuprofen as needed; plain Mucinex.  Instructed patient to follow-up with her PCP if she is not improving.  Education provided on otitis media and sinus infection.  She agrees to plan of care.  Final Clinical Impressions(s) / UC Diagnoses   Final diagnoses:  Right otitis media, unspecified otitis media type  Acute non-recurrent maxillary sinusitis     Discharge Instructions      Take the Augmentin  as directed.  Follow-up with your primary care provider if your symptoms are not improving.      ED Prescriptions     Medication Sig Dispense Auth. Provider   amoxicillin -clavulanate (AUGMENTIN ) 875-125 MG tablet Take 1 tablet by mouth every 12 (twelve) hours. 14 tablet Corlis Burnard DEL, NP      PDMP not reviewed this encounter.   Corlis Burnard DEL, NP 12/01/23 1048

## 2023-12-06 ENCOUNTER — Telehealth (INDEPENDENT_AMBULATORY_CARE_PROVIDER_SITE_OTHER): Payer: BC Managed Care – PPO | Admitting: Physician Assistant

## 2023-12-06 ENCOUNTER — Ambulatory Visit: Payer: Self-pay

## 2023-12-06 DIAGNOSIS — H6501 Acute serous otitis media, right ear: Secondary | ICD-10-CM

## 2023-12-06 DIAGNOSIS — H65191 Other acute nonsuppurative otitis media, right ear: Secondary | ICD-10-CM | POA: Diagnosis not present

## 2023-12-06 DIAGNOSIS — Z87891 Personal history of nicotine dependence: Secondary | ICD-10-CM | POA: Diagnosis not present

## 2023-12-06 NOTE — Progress Notes (Signed)
 Virtual Visit via Video Note  I connected with Priscilla Meza on 12/06/23 at  1:40 PM EST by a video enabled telemedicine application and verified that I am speaking with the correct person using two identifiers.  Today's Provider: [place], MHS, PA-C Introduced myself to the patient as a PA-C and provided education on APPs in clinical practice.   Location: Patient:  at home  Provider: [facility], [place], [place]   I discussed the limitations of evaluation and management by telemedicine and the availability of in person appointments. The patient expressed understanding and agreed to proceed.   Chief Complaint  Patient presents with   Ear Fullness    R, seen at [facility] and given antibiotics.Taking Augmentin  as directed, taking OTC medicines. Fullness not better, but pain has gone away.    History of Present Illness:  Discussed the use of AI scribe software for clinical note transcription with the patient, who gave verbal consent to proceed.  History of Present Illness   The patient, with a history of sinusitis, presented with bilateral ear congestion and sinus congestion. The symptoms began 17 days prior, with the right  ear becoming completely blocked and throbbing seven days ago. The left  ear is slightly blocked. The patient sought care at an urgent care facility six days ago, where they were started on a seven-day course of Augmentin . The patient reports that the pain has improved, but the ear blockage persists.  The patient has been using an over-the-counter nasal decongestant spray (Oxymetazoline) and Mucinex to manage the symptoms. However, there has been no relief in the ear congestion or sinus congestion. The patient has not reported any other new symptoms or changes in health since the onset of the current illness.        Observations/Objective:  Due to the nature of the virtual visit, physical exam and observations are limited. Able to obtain the following  observations:   Alert, oriented, Appears comfortable, in no acute distress.  No scleral injection, no appreciated hoarseness, tachypnea, wheeze or strider. Able to maintain conversation without visible strain.  No cough appreciated during visit.   Assessment and Plan:   Problem List Items Addressed This Visit   None Visit Diagnoses       Right acute serous otitis media, recurrence not specified    -  Primary      Otitis Media with Effusion Ear infection starting December 21, worsening on December 31. Augmentin  for 7 days improved pain but not congestion. Right ear completely blocked, left ear partially blocked. Suspected residual inflammation and fluid buildup in the middle ear. Augmentin  covers most of common bacteria causing ear infections. Flonase and antihistamine may take weeks to reduce inflammation and open drainage tubes. - Discontinue oxymetazoline nasal spray - Start Flonase (fluticasone) nasal spray - Start Claritin or Zyrtec - Continue Mucinex - Re-evaluate in 2 weeks if symptoms persist or worsen  Sinus Congestion Sinus congestion for 17 days. Oxymetazoline nasal spray may have caused rebound congestion. Recommended Flonase and antihistamine to reduce inflammation and improve drainage. Medications may take weeks to start working. - Discontinue oxymetazoline nasal spray - Start Flonase (fluticasone) nasal spray - Start Claritin or Zyrtec - Continue Mucinex - Re-evaluate in 2 weeks if symptoms persist or worsen.   No follow-ups on file.     Follow Up Instructions:    I discussed the assessment and treatment plan with the patient. The patient was provided an opportunity to ask questions and all were  answered. The patient agreed with the plan and demonstrated an understanding of the instructions.   The patient was advised to call back or seek an in-person evaluation if the symptoms worsen or if the condition fails to improve as anticipated.  I provided 7  minutes of non-face-to-face time during this encounter.   I, Priscilla Whitmoyer E Faviola Klare, PA-C, have reviewed all documentation for this visit. The documentation on 12/06/23 for the exam, diagnosis, procedures, and orders are all accurate and complete.   [place], MHS, PA-C [facility]

## 2023-12-06 NOTE — Telephone Encounter (Signed)
 Chief Complaint: Ear fullness Symptoms: right ear fullness, left ear partial fullness, headache, congestion Frequency: ongoing Pertinent Negatives: Patient denies fever, cough, chest pain, nausea, vomiting, dizziness  Disposition: [] ED /[] Urgent Care (no appt availability in office) / [x] Appointment(In office/virtual)/ []  [facility]/ [] Home Care/ [] Refused Recommended Disposition /[] [facility]/ []  Follow-up with PCP Additional Notes: Patient stated she was seen at urgent care on 12/01/23 and was treated with antibiotics for a right ear infection and  sinusitis. Patient reports taking the Augmentin  as directed, using tylenol  and ibuprofen as needed and taking plain mucinex. Patient states the right ear still feels very clogged and blocked but the pain has improved. Patient reports the left ear is starting to feel clogged as well. Patient states she though she would be feeling better by now. Care advice was given and patient has been scheduled for a MyChart video visit to discuss symptoms with a provider today.  Summary: right ear closed up   Pt states that she was seen at the [facility] 6 days ago and they gave her medication for her middle ear infection in her right ear. Pt states that the pain is gone but her ear is still closed up. Pt is wanting to know what else to do. Please advise.  Medication: amoxicillin -clavulanate (AUGMENTIN ) 875-125 MG tablet     Reason for Disposition  [1] Taking antibiotic > 72 hours (3 days) and [2] pain persists or recurs  Answer Assessment - Initial Assessment Questions 1. ANTIBIOTIC: What antibiotic are you taking? How many times per day?     Augmentin  875-125 MG BID x 7 days  2. ONSET: When was the antibiotic started?     12/01/23 3. LOCATION: Which ear is involved?     Right ear  4. PAIN: How bad is the pain?   (Scale 1-10; mild, moderate or severe)   - MILD (1-3): doesn't interfere with normal activities    - MODERATE  (4-7): interferes with normal activities or awakens from sleep    - SEVERE (8-10): excruciating pain, unable to do any normal activities      Mild to Moderate 5. FEVER: Do you have a fever? If Yes, ask: What is your temperature, how was it measured, and when did it start?     No  6. DISCHARGE: Is there any discharge from the ear?     No  7. OTHER SYMPTOMS: Do you have any other symptoms? (e.g., headache, stiff neck, dizziness, vomiting, runny nose)     Congestion, headache,  Protocols used: Ear - Otitis Media Follow-up Call-A-AH

## 2023-12-06 NOTE — Patient Instructions (Signed)
 I also recommend adding an antihistamine to your daily regimen This includes medications like Claritin, Allegra, Zyrtec- the generics of these work very well and are usually less expensive I recommend using Flonase nasal spray - 2 puffs twice per day to help with your nasal congestion The antihistamines and Flonase can take a few weeks to provide significant relief from allergy symptoms but should start to provide some benefit soon.

## 2023-12-10 DIAGNOSIS — J00 Acute nasopharyngitis [common cold]: Secondary | ICD-10-CM | POA: Diagnosis not present

## 2023-12-14 ENCOUNTER — Ambulatory Visit (INDEPENDENT_AMBULATORY_CARE_PROVIDER_SITE_OTHER): Payer: BC Managed Care – PPO | Admitting: Physician Assistant

## 2023-12-14 VITALS — BP 182/83 | HR 66 | Temp 98.7°F | Ht 61.0 in | Wt 154.0 lb

## 2023-12-14 DIAGNOSIS — I10 Essential (primary) hypertension: Secondary | ICD-10-CM | POA: Diagnosis not present

## 2023-12-14 DIAGNOSIS — J018 Other acute sinusitis: Secondary | ICD-10-CM

## 2023-12-14 MED ORDER — AMOXICILLIN-POT CLAVULANATE 875-125 MG PO TABS: 1.0000 | ORAL_TABLET | Freq: Two times a day (BID) | ORAL | 0 refills | Status: DC

## 2023-12-14 MED ORDER — PREDNISONE 20 MG PO TABS: 20.0000 mg | ORAL_TABLET | Freq: Every day | ORAL | 0 refills | Status: DC

## 2023-12-14 NOTE — Progress Notes (Signed)
 Established patient visit  Patient: Priscilla Meza   DOB: 07-11-1956   68 y.o. Female  MRN: [id number] Visit Date: 12/14/2023  Today's healthcare provider: Jolynn Spencer, PA-C   Chief Complaint  Patient presents with   Nasal Congestion    Congestion, both ear pain, throat drainage, went to urgent care last month--3 weeks.   Subjective      Discussed the use of AI scribe software for clinical note transcription with the patient, who gave verbal consent to proceed.  History of Present Illness   The patient, with a history of hypertension, presents with a three-week history of ear congestion, earache, and sinusitis. The symptoms started on December 21st and progressed to an ear infection on New Year's Day. The patient completed a week of Augmentin , which alleviated the earache but the ear remains congested. The patient reports that the right ear is completely blocked, while the left ear is partially blocked. The patient also experiences a lump in the throat, indicating possible post-nasal drip. Despite trying Zyrtec, Flonase, Singulair, Sudafed, and Mucinex as advised by different healthcare providers, the symptoms persist. The patient denies any history of similar prolonged symptoms and reports rarely falling sick. The patient also denies any exposure to COVID-19 or flu, and has been vaccinated for both. The patient has been using oxymetazoline nasal spray and has been advised to switch to Clinisol?The patient reports no fever, but experiences headaches, especially in the morning. The patient denies any pain in the ears but reports discomfort and a feeling of blockage. The patient also reports high blood pressure, which is usually managed with hydrochlorothiazide  and lisinopril .           11/19/2023    9:00 AM 11/18/2023    7:59 AM 10/19/2023    9:33 AM  Depression screen PHQ 2/9  Decreased Interest 0 0 0  Down, Depressed, Hopeless 0 0 0  PHQ - 2 Score 0 0 0  Altered sleeping 1  0   Tired, decreased energy 0  0  Change in appetite 0  0  Feeling bad or failure about yourself  0  0  Trouble concentrating 0  0  Moving slowly or fidgety/restless 0  0  Suicidal thoughts 0  0  PHQ-9 Score 1  0  Difficult doing work/chores Not difficult at all  Not difficult at all      11/19/2023    9:00 AM 10/19/2023    9:33 AM  GAD 7 : Generalized Anxiety Score  Nervous, Anxious, on Edge 0 0  Control/stop worrying 0 0  Worry too much - different things 0 0  Trouble relaxing 0 0  Restless 0 0  Easily annoyed or irritable 0 0  Afraid - awful might happen 0 0  Total GAD 7 Score 0 0  Anxiety Difficulty Not difficult at all Not difficult at all    Medications: Outpatient Medications Prior to Visit  Medication Sig   Buprenorphine  HCl (BELBUCA ) 300 MCG FILM Place 1 Film inside cheek every 12 (twelve) hours.   hydrochlorothiazide  (MICROZIDE ) 12.5 MG capsule Take 1 capsule (12.5 mg total) by mouth daily.   HYDROcodone -acetaminophen  (NORCO/VICODIN) 5-325 MG tablet Take 1 tablet by mouth every 6 (six) hours as needed for severe pain (pain score 7-10). Must last 30 days.   [START ON 01/02/2024] HYDROcodone -acetaminophen  (NORCO/VICODIN) 5-325 MG tablet Take 1 tablet by mouth every 6 (six) hours as needed for severe pain (pain score 7-10). Must last 30 days.   [START ON 02/01/2024] HYDROcodone -acetaminophen  (  NORCO/VICODIN) 5-325 MG tablet Take 1 tablet by mouth every 6 (six) hours as needed for severe pain (pain score 7-10). Must last 30 days.   lisinopril  (ZESTRIL ) 20 MG tablet Take 2 tablets (40 mg total) by mouth daily.   rosuvastatin  (CRESTOR ) 40 MG tablet Take 1 tablet (40 mg total) by mouth daily.   [DISCONTINUED] amoxicillin -clavulanate (AUGMENTIN ) 875-125 MG tablet Take 1 tablet by mouth every 12 (twelve) hours.   No facility-administered medications prior to visit.    Review of Systems All negative Except see HPI       Objective    BP (!) 182/83 (BP Location: Right Arm,  Patient Position: Sitting, Cuff Size: Normal)   Pulse 66   Temp 98.7 F (37.1 C)   Ht 5' 1 (1.549 m)   Wt 154 lb (69.9 kg)   SpO2 97%   BMI 29.10 kg/m     Physical Exam Vitals reviewed.  Constitutional:      General: She is not in acute distress.    Appearance: Normal appearance. She is well-developed. She is not diaphoretic.  HENT:     Head: Normocephalic and atraumatic.  Eyes:     General: No scleral icterus.    Conjunctiva/sclera: Conjunctivae normal.  Neck:     Thyroid: No thyromegaly.  Cardiovascular:     Rate and Rhythm: Normal rate and regular rhythm.     Pulses: Normal pulses.     Heart sounds: Normal heart sounds. No murmur heard. Pulmonary:     Effort: Pulmonary effort is normal. No respiratory distress.     Breath sounds: Normal breath sounds. No wheezing, rhonchi or rales.  Musculoskeletal:     Cervical back: Neck supple.     Right lower leg: No edema.     Left lower leg: No edema.  Lymphadenopathy:     Cervical: No cervical adenopathy.  Skin:    General: Skin is warm and dry.     Findings: No rash.  Neurological:     Mental Status: She is alert and oriented to person, place, and time. Mental status is at baseline.  Psychiatric:        Mood and Affect: Mood normal.        Behavior: Behavior normal.      No results found for any visits on 12/14/23.      Assessment and Plan    Sinusitis Persistent congestion and ear fullness despite multiple treatments including Augmentin , Zyrtec, Flonase, Singulair, Sudafed, and Mucinex. No fever or systemic symptoms. Suspected rebound congestion from Afrin use. -Start Prednisone  for 5 days to reduce inflammation. -Continue Zyrtec in the morning and Singulair at bedtime. -Add Mucinex as needed for cough. -If no improvement in a couple of days, start antibiotics. -If still no improvement by the end of the week, refer to ENT. Former smoker  Hypertension chronic and unstable, could be due to ear pain,  sinusitis Elevated blood pressure despite being on Lisinopril  and Hydrochlorothiazide . -Monitor blood pressure at home twice daily and record readings. Pt prefers to re measure her BP at home before adding a new bp medication -Return in 2 weeks for blood pressure check. -Consider adding additional antihypertensive medication if blood pressure remains high. Advised to adhere to low salt diet and daily exercise      No orders of the defined types were placed in this encounter.   Return in about 2 weeks (around 12/28/2023).   The patient was advised to call back or seek an in-person evaluation if the  symptoms worsen or if the condition fails to improve as anticipated.  I discussed the assessment and treatment plan with the patient. The patient was provided an opportunity to ask questions and all were answered. The patient agreed with the plan and demonstrated an understanding of the instructions.  I, Jasim Harari, PA-C have reviewed all documentation for this visit. The documentation on 12/14/2023  for the exam, diagnosis, procedures, and orders are all accurate and complete.  Jolynn Spencer, [facility], MMS [facility] [phone] (phone) [phone] (fax)  [facility]

## 2023-12-15 ENCOUNTER — Encounter: Payer: Self-pay | Admitting: Physician Assistant

## 2023-12-20 ENCOUNTER — Encounter: Payer: Self-pay | Admitting: Physician Assistant

## 2023-12-20 DIAGNOSIS — J018 Other acute sinusitis: Secondary | ICD-10-CM

## 2023-12-21 NOTE — Telephone Encounter (Signed)
Per OV 12/14/23: If still no improvement by the end of the week, refer to ENT.   Referral to ENT placed. Patient aware.

## 2023-12-22 ENCOUNTER — Telehealth: Payer: Self-pay

## 2023-12-22 DIAGNOSIS — H938X9 Other specified disorders of ear, unspecified ear: Secondary | ICD-10-CM

## 2023-12-22 NOTE — Telephone Encounter (Signed)
Copied from CRM [id number]. Topic: Referral - Request for Referral >> Dec 20, 2023  2:20 PM Clide Dales wrote: Did the patient discuss referral with their provider in the last year? Yes (If No - schedule appointment) (If Yes - send message)  Appointment offered? No  Type of order/referral and detailed reason for visit: Congestion  Preference of office, provider, location: ENT  If referral order, have you been seen by this specialty before? No (If Yes, this issue or another issue? When? Where?  Can we respond through MyChart? Yes

## 2023-12-27 DIAGNOSIS — H903 Sensorineural hearing loss, bilateral: Secondary | ICD-10-CM | POA: Diagnosis not present

## 2023-12-27 DIAGNOSIS — H90A21 Sensorineural hearing loss, unilateral, right ear, with restricted hearing on the contralateral side: Secondary | ICD-10-CM | POA: Diagnosis not present

## 2023-12-27 DIAGNOSIS — H6983 Other specified disorders of Eustachian tube, bilateral: Secondary | ICD-10-CM | POA: Diagnosis not present

## 2023-12-31 ENCOUNTER — Ambulatory Visit (INDEPENDENT_AMBULATORY_CARE_PROVIDER_SITE_OTHER): Payer: BC Managed Care – PPO | Admitting: Physician Assistant

## 2023-12-31 ENCOUNTER — Encounter: Payer: Self-pay | Admitting: Physician Assistant

## 2023-12-31 VITALS — BP 146/68 | Ht 61.5 in | Wt 156.0 lb

## 2023-12-31 DIAGNOSIS — I10 Essential (primary) hypertension: Secondary | ICD-10-CM | POA: Diagnosis not present

## 2023-12-31 DIAGNOSIS — H938X9 Other specified disorders of ear, unspecified ear: Secondary | ICD-10-CM

## 2023-12-31 DIAGNOSIS — R195 Other fecal abnormalities: Secondary | ICD-10-CM

## 2023-12-31 NOTE — Progress Notes (Signed)
Established patient visit  Patient: Priscilla Meza   DOB: 03-Apr-1956   68 y.o. Female  MRN: [id number] Visit Date: 12/31/2023  Today's healthcare provider: Debera Lat, PA-C   Chief Complaint  Patient presents with   Follow-up    2 weeks f/u   Subjective      Discussed the use of AI scribe software for clinical note transcription with the patient, who gave verbal consent to proceed.  History of Present Illness   The patient, with a history of hypertension, presents with concerns about high blood pressure readings at home. They report that their blood pressure was 117/58 in the morning, approximately 45 minutes after taking their antihypertensive medications. However, they note that their blood pressure tends to be higher in the afternoon or at night, especially when they are tired. The patient has been taking both of their antihypertensive medications in the morning and is considering splitting the doses to take one in the morning and one in the evening.  In addition to their hypertension, the patient has recently had an abnormal Cologuard test. They had planned to have a colonoscopy in January, but this was delayed due to their current health concerns. They express a desire to schedule the colonoscopy once their other health issues are resolved.  The patient also reports a recent diagnosis of sudden hearing loss in the right ear, which they are skeptical about. They report that both ears feel the same and they do not believe the hearing loss is permanent. They are considering seeking a second opinion on this matter.  The patient is currently on a tapering course of prednisone for sinusitis, with three days left on the course. They report that their sinusitis symptoms are improving. They also mention that they have been experiencing headaches, which they believe are related to their sinusitis and other head symptoms.           12/31/2023    8:22 AM 11/19/2023    9:00 AM 11/18/2023     7:59 AM  Depression screen PHQ 2/9  Decreased Interest 0 0 0  Down, Depressed, Hopeless 0 0 0  PHQ - 2 Score 0 0 0  Altered sleeping 0 1   Tired, decreased energy 0 0   Change in appetite 0 0   Feeling bad or failure about yourself  0 0   Trouble concentrating 0 0   Moving slowly or fidgety/restless 0 0   Suicidal thoughts 0 0   PHQ-9 Score 0 1   Difficult doing work/chores Not difficult at all Not difficult at all       12/31/2023    8:22 AM 11/19/2023    9:00 AM 10/19/2023    9:33 AM  GAD 7 : Generalized Anxiety Score  Nervous, Anxious, on Edge 0 0 0  Control/stop worrying 0 0 0  Worry too much - different things 0 0 0  Trouble relaxing 0 0 0  Restless 0 0 0  Easily annoyed or irritable 0 0 0  Afraid - awful might happen 0 0 0  Total GAD 7 Score 0 0 0  Anxiety Difficulty Not difficult at all Not difficult at all Not difficult at all    Medications: Outpatient Medications Prior to Visit  Medication Sig   hydrochlorothiazide (MICROZIDE) 12.5 MG capsule Take 1 capsule (12.5 mg total) by mouth daily.   HYDROcodone-acetaminophen (NORCO/VICODIN) 5-325 MG tablet Take 1 tablet by mouth every 6 (six) hours as needed for severe pain (pain score 7-10). Must  last 30 days.   [START ON 01/02/2024] HYDROcodone-acetaminophen (NORCO/VICODIN) 5-325 MG tablet Take 1 tablet by mouth every 6 (six) hours as needed for severe pain (pain score 7-10). Must last 30 days.   [START ON 02/01/2024] HYDROcodone-acetaminophen (NORCO/VICODIN) 5-325 MG tablet Take 1 tablet by mouth every 6 (six) hours as needed for severe pain (pain score 7-10). Must last 30 days.   lisinopril (ZESTRIL) 20 MG tablet Take 2 tablets (40 mg total) by mouth daily.   predniSONE (DELTASONE) 20 MG tablet Take 1 tablet (20 mg total) by mouth daily with breakfast.   rosuvastatin (CRESTOR) 40 MG tablet Take 1 tablet (40 mg total) by mouth daily.   [DISCONTINUED] amoxicillin-clavulanate (AUGMENTIN) 875-125 MG tablet Take 1 tablet by  mouth 2 (two) times daily.   No facility-administered medications prior to visit.    Review of Systems All negative Except see HPI       Objective    BP (!) 146/68 Comment: pt b/p machine  Ht 5' 1.5" (1.562 m)   Wt 156 lb (70.8 kg)   BMI 29.00 kg/m     Physical Exam Vitals reviewed.  Constitutional:      General: She is not in acute distress.    Appearance: Normal appearance. She is well-developed. She is obese. She is not diaphoretic.  HENT:     Head: Normocephalic and atraumatic.  Eyes:     General: No scleral icterus.    Conjunctiva/sclera: Conjunctivae normal.  Neck:     Thyroid: No thyromegaly.  Cardiovascular:     Rate and Rhythm: Normal rate and regular rhythm.     Pulses: Normal pulses.     Heart sounds: Normal heart sounds. No murmur heard. Pulmonary:     Effort: Pulmonary effort is normal. No respiratory distress.     Breath sounds: Normal breath sounds. No wheezing, rhonchi or rales.  Musculoskeletal:     Cervical back: Neck supple.     Right lower leg: No edema.     Left lower leg: No edema.  Lymphadenopathy:     Cervical: No cervical adenopathy.  Skin:    General: Skin is warm and dry.     Findings: No rash.  Neurological:     Mental Status: She is alert and oriented to person, place, and time. Mental status is at baseline.  Psychiatric:        Mood and Affect: Mood normal.        Behavior: Behavior normal.      No results found for any visits on 12/31/23.      Assessment and Plan    Hypertension chronic Elevated blood pressure readings, currently on Lisinopril 40mg  and Hydrochlorothiazide. Discussed the importance of consistent medication timing and the potential benefit of splitting doses to morning and evening. -Continue taking Lisinopril 40mg  in the evening. -Continue Hydrochlorothiazide in the morning. -Monitor blood pressure readings in the morning and evening for two weeks and report back. Advised to adhere to a strict low  sodium diet/dash diet Will follow-up in 2 weeks vs 6 weeks  Hearing Loss Patient reports sudden hearing loss in the right ear, currently under evaluation by ENT. Expressed desire for a second opinion. -Encouraged patient to discuss concerns with current ENT and consider seeking a second opinion if unsatisfied with the assessment.  Abnormal Cologuard Test Patient reports an abnormal Cologuard test and plans to schedule a colonoscopy once current health issues are resolved. -Encourage patient to schedule colonoscopy when able.  General Health Maintenance -Consider DEXA  scan and vaccinations at future visit. -Encouraged lifestyle modifications including reduced salt intake and stress management techniques.      No orders of the defined types were placed in this encounter.   Return in about 6 weeks (around 02/11/2024) for BP f/u.   The patient was advised to call back or seek an in-person evaluation if the symptoms worsen or if the condition fails to improve as anticipated.  I discussed the assessment and treatment plan with the patient. The patient was provided an opportunity to ask questions and all were answered. The patient agreed with the plan and demonstrated an understanding of the instructions.  I, Debera Lat, PA-C have reviewed all documentation for this visit. The documentation on 12/31/2023  for the exam, diagnosis, procedures, and orders are all accurate and complete.  Debera Lat, [facility], MMS [facility] [phone] (phone) [phone] (fax)  [facility]

## 2024-01-06 DIAGNOSIS — H903 Sensorineural hearing loss, bilateral: Secondary | ICD-10-CM | POA: Diagnosis not present

## 2024-01-06 DIAGNOSIS — H6983 Other specified disorders of Eustachian tube, bilateral: Secondary | ICD-10-CM | POA: Diagnosis not present

## 2024-01-14 ENCOUNTER — Other Ambulatory Visit: Payer: Self-pay | Admitting: Physician Assistant

## 2024-01-14 DIAGNOSIS — R600 Localized edema: Secondary | ICD-10-CM

## 2024-01-14 DIAGNOSIS — I10 Essential (primary) hypertension: Secondary | ICD-10-CM

## 2024-01-14 NOTE — Telephone Encounter (Addendum)
[facility] faxed refill request for the following medications:   hydrochlorothiazide (MICROZIDE) 12.5 MG capsule     lisinopril (ZESTRIL) 20 MG tablet    Please advise.

## 2024-01-16 ENCOUNTER — Encounter: Payer: Self-pay | Admitting: Physician Assistant

## 2024-01-17 MED ORDER — HYDROCHLOROTHIAZIDE 12.5 MG PO CAPS: 12.5000 mg | ORAL_CAPSULE | Freq: Every day | ORAL | 0 refills | Status: DC

## 2024-01-17 MED ORDER — LISINOPRIL 20 MG PO TABS: 40.0000 mg | ORAL_TABLET | Freq: Every day | ORAL | 0 refills | Status: DC

## 2024-01-17 NOTE — Telephone Encounter (Signed)
[facility] in [place] faxed refill request for the following medications:   rosuvastatin (CRESTOR) 40 MG tablet     Please advise.

## 2024-01-19 ENCOUNTER — Other Ambulatory Visit: Payer: Self-pay

## 2024-01-19 DIAGNOSIS — I2510 Atherosclerotic heart disease of native coronary artery without angina pectoris: Secondary | ICD-10-CM

## 2024-01-19 MED ORDER — ROSUVASTATIN CALCIUM 40 MG PO TABS: 40.0000 mg | ORAL_TABLET | Freq: Every day | ORAL | 0 refills | Status: DC

## 2024-02-18 ENCOUNTER — Telehealth: Payer: Self-pay | Admitting: Physician Assistant

## 2024-02-18 DIAGNOSIS — I10 Essential (primary) hypertension: Secondary | ICD-10-CM

## 2024-02-18 NOTE — Telephone Encounter (Signed)
 [facility] is [facility] refill lisinopril (ZESTRIL) 20 MG tablet   Please advise

## 2024-02-18 NOTE — Telephone Encounter (Signed)
 Pt reports using [facility]. Rx request will be denied as rx already sent to [facility] 01/17/24

## 2024-02-22 MED ORDER — LISINOPRIL 20 MG PO TABS: 40.0000 mg | ORAL_TABLET | Freq: Every day | ORAL | 0 refills | Status: DC

## 2024-02-22 NOTE — Addendum Note (Signed)
 Addended by: Liz Beach on: 02/22/2024 11:39 AM   Modules accepted: Orders

## 2024-02-22 NOTE — Telephone Encounter (Signed)
 Received another request for this medication from [facility] in [place].  This medication was sent to [facility] on 01/17/2024 but was supposed to be [facility] at [facility] time.  That is where the request was [facility].  [facility] [place].

## 2024-02-29 ENCOUNTER — Telehealth: Payer: Self-pay

## 2024-02-29 ENCOUNTER — Encounter: Payer: Self-pay | Admitting: Student in an Organized Health Care Education/Training Program

## 2024-02-29 ENCOUNTER — Ambulatory Visit
Payer: BC Managed Care – PPO | Attending: Student in an Organized Health Care Education/Training Program | Admitting: Student in an Organized Health Care Education/Training Program

## 2024-02-29 VITALS — BP 156/51 | HR 74 | Temp 97.4°F | Ht 61.5 in | Wt 155.0 lb

## 2024-02-29 DIAGNOSIS — Z0289 Encounter for other administrative examinations: Secondary | ICD-10-CM

## 2024-02-29 DIAGNOSIS — Z79891 Long term (current) use of opiate analgesic: Secondary | ICD-10-CM

## 2024-02-29 DIAGNOSIS — G894 Chronic pain syndrome: Secondary | ICD-10-CM

## 2024-02-29 MED ORDER — HYDROCODONE-ACETAMINOPHEN 5-325 MG PO TABS: 1.0000 | ORAL_TABLET | Freq: Four times a day (QID) | ORAL | 0 refills | Status: AC | PRN

## 2024-02-29 MED ORDER — BELBUCA 75 MCG BU FILM: 1.0000 | ORAL_FILM | Freq: Two times a day (BID) | BUCCAL | 1 refills | Status: DC

## 2024-02-29 MED ORDER — BELBUCA 150 MCG BU FILM: 1.0000 | ORAL_FILM | Freq: Two times a day (BID) | BUCCAL | 1 refills | Status: AC

## 2024-02-29 MED ORDER — HYDROCODONE-ACETAMINOPHEN 5-325 MG PO TABS: 1.0000 | ORAL_TABLET | Freq: Four times a day (QID) | ORAL | 0 refills | Status: DC | PRN

## 2024-02-29 NOTE — Telephone Encounter (Signed)
 Sent patient message via MyChart message per patient request.

## 2024-02-29 NOTE — Progress Notes (Signed)
 PROVIDER NOTE: Information contained herein reflects review and annotations entered in association with encounter. Interpretation of such information and data should be left to medically-trained personnel. Information provided to patient can be located elsewhere in the medical record under "Patient Instructions". Document created using STT-dictation technology, any transcriptional errors that may result from process are unintentional.    Patient: Priscilla Meza  Service Category: E/M  Provider: Edward Jolly, MD  DOB: [birth year]  DOS: 02/29/2024  Referring Provider: Jacky Kindle, FNP  MRN: [id number]  Specialty: Interventional Pain Management  PCP: Debera Lat, PA-C  Type: Established Patient  Setting: Ambulatory outpatient    Location: Office  Delivery: Face-to-face     HPI  [name], a 68 y.o. year old female, is here today because of her Chronic pain syndrome [G89.4]. [name] primary complain today is Back Pain (Lower medial )  Pertinent problems: Priscilla Meza has Chronic bilateral low back pain without sciatica; Chronic narcotic use; Chronic pain syndrome; Spinal stenosis, lumbar region, with neurogenic claudication; Lumbar facet arthropathy; Encounter for long-term opiate analgesic use; Lumbar radiculopathy; and Pain management contract signed on their pertinent problem list. Pain Assessment: Severity of Chronic pain is reported as a 2 /10. Location: Back Lower, Medial/Denies. Onset: More than a month ago. Quality: Constant, Aching. Timing: Constant. Modifying factor(s): Pain medication, lying down and rest. Vitals:  height is 5' 1.5" (1.562 m) and weight is 155 lb (70.3 kg). Her temporal temperature is 97.4 F (36.3 C) (abnormal). Her blood pressure is 156/51 (abnormal) and her pulse is 74. Her oxygen saturation is 100%.  BMI: Estimated body mass index is 28.81 kg/m as calculated from the following:   Height as of this encounter: 5' 1.5" (1.562 m).   Weight as of this encounter: 155  lb (70.3 kg). Last encounter: 11/18/2023. Last procedure: Visit date not found.  Reason for encounter:   Discussed the use of AI scribe software for clinical note transcription with the patient, who gave verbal consent to proceed.  History of Present Illness   Priscilla Meza "Priscilla Meza" is a 68 year old female who presents for medication management for chronic pain.  She is currently tapering her dose of Belbuca films, which she started at 300 mcg. She plans to reduce the dose to 150 mcg twice daily for two months, followed by 75 mcg twice daily for another two months. She recently filled her 300 mcg prescription on February 25, 2024, and plans to transition to the lower dose after completing this supply.  She is also taking hydrocodone, which she is not ready to discontinue. She takes four tablets daily and plans to continue this regimen. Her next refill is due on March 03, 2024. Some days she requires the full dose of hydrocodone to manage her pain.  She has not reported any changes in her medical history since her last visit. She is motivated to reduce her medication use and aims to eventually discontinue Belbuca.       Pharmacotherapy Assessment  Analgesic: Belbuca 450-->300-->150 (at next dose)-->75 mcg bid there after mcg every 12 hours, hydrocodone 10 mg every 6 hours as needed   Monitoring: [place] PMP: PDMP reviewed during this encounter.       Pharmacotherapy: No side-effects or adverse reactions reported. Compliance: No problems identified. Effectiveness: Clinically acceptable.  Priscilla Meza, Priscilla Meza  02/29/2024  8:14 AM  Sign when Signing Visit Nursing Pain Medication Assessment:  Safety precautions to be maintained throughout the outpatient stay will include: orient  to surroundings, keep bed in low position, maintain call bell within reach at all times, provide assistance with transfer out of bed and ambulation.   Medication Inspection Compliance: Pill count conducted under aseptic  conditions, in front of the patient. Neither the pills nor the bottle was removed from the patient's sight at any time. Once count was completed pills were immediately returned to the patient in their original bottle.  Medication #1: Buprenorphine (Suboxone) Pill/Patch Count:  58 of 60 patches remain Pill/Patch Appearance: Markings consistent with prescribed medication Bottle Appearance: Standard pharmacy container. Clearly labeled. Filled Date: 03 / 27 / 2025 Last Medication intake:  Today  Medication #2: Hydrocodone/APAP Pill/Patch Count:  21 of 120 pills remain Pill/Patch Appearance: Markings consistent with prescribed medication Bottle Appearance: Standard pharmacy container. Clearly labeled. Filled Date: 03 / 05 / 2025 Last Medication intake:  Today  No results found for: "CBDTHCR" No results found for: "D8THCCBX" No results found for: "D9THCCBX"  UDS:  Summary  Date Value Ref Range Status  12/01/2022 Note  Final    Comment:    ==================================================================== [employer] Select 13 (MW) ==================================================================== Test                             Result       Flag       Units  Drug Present and Declared for Prescription Verification   Hydrocodone                    1732         EXPECTED   ng/mg creat   Hydromorphone                  317          EXPECTED   ng/mg creat   Dihydrocodeine                 500          EXPECTED   ng/mg creat   Norhydrocodone                 3552         EXPECTED   ng/mg creat    Sources of hydrocodone include scheduled prescription medications.    Hydromorphone, dihydrocodeine and norhydrocodone are expected    metabolites of hydrocodone. Hydromorphone and dihydrocodeine are    also available as scheduled prescription medications.    Buprenorphine                  40           EXPECTED   ng/mg creat   Norbuprenorphine               113          EXPECTED   ng/mg creat     Source of buprenorphine is a scheduled prescription medication.    Norbuprenorphine is an expected metabolite of buprenorphine.  ==================================================================== Test                      Result    Flag   Units      Ref Range   Creatinine              60               mg/dL      >=47 ==================================================================== Declared Medications:  The flagging and interpretation on this report are  based on the  following declared medications.  Unexpected results may arise from  inaccuracies in the declared medications.   **Note: The testing scope of this panel includes these medications:   Hydrocodone (Norco)   **Note: The testing scope of this panel does not include small to  moderate amounts of these reported medications:   Buprenorphine (Belbuca)   **Note: The testing scope of this panel does not include the  following reported medications:   Acetaminophen (Norco)  Atorvastatin (Lipitor)  Hydrochlorothiazide  Lisinopril (Zestril) ==================================================================== For clinical consultation, please call [phone]. ====================================================================       ROS  Constitutional: Denies any fever or chills Gastrointestinal: No reported hemesis, hematochezia, vomiting, or acute GI distress Musculoskeletal: Denies any acute onset joint swelling, redness, loss of ROM, or weakness Neurological: No reported episodes of acute onset apraxia, aphasia, dysarthria, agnosia, amnesia, paralysis, loss of coordination, or loss of consciousness  Medication Review  Buprenorphine HCl, HYDROcodone-acetaminophen, hydrochlorothiazide, lisinopril, and rosuvastatin  History Review  Allergy: Priscilla Meza has no known allergies. Drug: Priscilla Meza  reports no history of drug use. Alcohol:  reports no history of alcohol use. Tobacco:  reports that she has quit smoking.  She has never used smokeless tobacco. Social: Priscilla Meza  reports that she has quit smoking. She has never used smokeless tobacco. She reports that she does not drink alcohol and does not use drugs. Medical:  has a past medical history of Frequent headaches, GERD (gastroesophageal reflux disease), Hypertension, and Urine incontinence. Surgical: Priscilla Meza  has a past surgical history that includes Abdominal hysterectomy (1985) and Tubal ligation. Family: family history includes Breast cancer in her maternal aunt; Colon cancer in her maternal grandmother.  Laboratory Chemistry Profile   Renal Lab Results  Component Value Date   BUN 18 10/19/2023   CREATININE 0.91 10/19/2023   BCR 20 10/19/2023   GFRAA 72 01/04/2020   GFRNONAA 62 01/04/2020    Hepatic Lab Results  Component Value Date   AST 14 10/19/2023   ALT 8 10/19/2023   ALBUMIN 4.0 10/19/2023   ALKPHOS 92 10/19/2023   LIPASE 137 12/01/2014    Electrolytes Lab Results  Component Value Date   NA 142 10/19/2023   K 4.1 10/19/2023   CL 108 (H) 10/19/2023   CALCIUM 9.3 10/19/2023    Bone No results found for: "VD25OH", "VD125OH2TOT", "ON6295MW4", "XL2440NU2", "25OHVITD1", "25OHVITD2", "25OHVITD3", "TESTOFREE", "TESTOSTERONE"  Inflammation (CRP: Acute Phase) (ESR: Chronic Phase) No results found for: "CRP", "ESRSEDRATE", "LATICACIDVEN"       Note: Above Lab results reviewed.  Recent Imaging Review  DG Lumbar Spine Complete W/Bend CLINICAL DATA:  Low back pain.  EXAM: LUMBAR SPINE - COMPLETE WITH BENDING VIEWS  COMPARISON:  None.  FINDINGS: 5 nonrib bearing lumbar-type vertebral bodies.  Vertebral body heights are maintained. No acute fracture. Generalized osteopenia.  Minimal grade 1 anterolisthesis of L4 on L5 and L5 on S1 secondary to facet disease. Minimal retrolisthesis of L2 on L3. No dynamic listhesis. No spondylolysis.  Degenerative disease with mild disc height loss at T12-L1, L1-2, L2-3 and L4-5.  Degenerative disease with disc height loss at L5-S1. Bilateral facet arthropathy at L4-5 and L5-S1.  SI joints are unremarkable.  Abdominal aortic atherosclerosis.  IMPRESSION: 1. Lumbar spine spondylosis as described above. 2. No acute osseous injury of the lumbar spine.  Electronically Signed   By: Elige Ko M.D.   On: 12/27/2021 07:26 Note: Reviewed        Physical Exam  General appearance: Well  nourished, well developed, and well hydrated. In no apparent acute distress Mental status: Alert, oriented x 3 (person, place, & time)       Respiratory: No evidence of acute respiratory distress Eyes: PERLA Vitals: BP (!) 156/51 (Patient Position: Sitting, Cuff Size: Normal)   Pulse 74   Temp (!) 97.4 F (36.3 C) (Temporal)   Ht 5' 1.5" (1.562 m)   Wt 155 lb (70.3 kg)   SpO2 100%   BMI 28.81 kg/m  BMI: Estimated body mass index is 28.81 kg/m as calculated from the following:   Height as of this encounter: 5' 1.5" (1.562 m).   Weight as of this encounter: 155 lb (70.3 kg). Ideal: Ideal body weight: 48.9 kg (107 lb 14.6 oz) Adjusted ideal body weight: 57.5 kg (126 lb 12 oz)  Assessment   Diagnosis Status  1. Chronic pain syndrome   2. Pain management contract signed   3. Encounter for long-term opiate analgesic use    Controlled Controlled Controlled   Updated Problems: No problems updated.  Plan of Care  Problem-specific:  Assessment and Plan    Chronic Pain Management   She is currently using Belbuca films at 300 mcg and hydrocodone for chronic pain. She is motivated to reduce opioid use and plans to wean off Belbuca over four months, considering her upcoming vacation to ensure comfort and activity. Continue Belbuca 300 mcg films until the end of April, then reduce to 150 mcg twice daily for two months. Further reduce to 75 mcg twice daily for two months starting at the end of June. Maintain her current hydrocodone prescription with monthly fills, with the next  due on April 4th. Repeat urine drug screen.    Follow-up   Monitor her response to the Belbuca tapering and hydrocodone use. Follow-up appointments will assess progress and adjust pain management as needed. She is encouraged to contact the office if she has difficulty with the tapering schedule. Schedule a follow-up appointment in August to assess progress with Belbuca tapering and touch base in July regarding hydrocodone prescription management.       Priscilla Meza has a current medication list which includes the following long-term medication(s): hydrochlorothiazide, lisinopril, and rosuvastatin.  Pharmacotherapy (Medications Ordered): Meds ordered this encounter  Medications   Buprenorphine HCl (BELBUCA) 150 MCG FILM    Sig: Place 1 Film inside cheek every 12 (twelve) hours.    Dispense:  60 Film    Refill:  1    Chronic Pain: STOP Act (Not applicable) Fill 1 day early if closed on refill date. Avoid benzodiazepines within 8 hours of opioids   Buprenorphine HCl (BELBUCA) 75 MCG FILM    Sig: Place 1 Film inside cheek every 12 (twelve) hours.    Dispense:  60 Film    Refill:  1    Chronic Pain: STOP Act (Not applicable) Fill 1 day early if closed on refill date. Avoid benzodiazepines within 8 hours of opioids   HYDROcodone-acetaminophen (NORCO/VICODIN) 5-325 MG tablet    Sig: Take 1 tablet by mouth every 6 (six) hours as needed for severe pain (pain score 7-10). Must last 30 days.    Dispense:  120 tablet    Refill:  0    Chronic Pain: STOP Act (Not applicable) Fill 1 day early if closed on refill date. Avoid benzodiazepines within 8 hours of opioids   HYDROcodone-acetaminophen (NORCO/VICODIN) 5-325 MG tablet    Sig: Take 1 tablet by mouth every 6 (six) hours as needed for  severe pain (pain score 7-10). Must last 30 days.    Dispense:  120 tablet    Refill:  0    Chronic Pain: STOP Act (Not applicable) Fill 1 day early if closed on refill date. Avoid benzodiazepines within 8 hours  of opioids   HYDROcodone-acetaminophen (NORCO/VICODIN) 5-325 MG tablet    Sig: Take 1 tablet by mouth every 6 (six) hours as needed for severe pain (pain score 7-10). Must last 30 days.    Dispense:  120 tablet    Refill:  0    Chronic Pain: STOP Act (Not applicable) Fill 1 day early if closed on refill date. Avoid benzodiazepines within 8 hours of opioids   Orders:  Orders Placed This Encounter  Procedures   Tox[employer]SU[employer] Select 13 (MW), Urine    Volume: 30 ml(s). Minimum 3 ml of urine is needed. Document temperature of fresh sample. Indications: Long term (current) use of opiate analgesic (X91.478)    Release to patient:   Immediate   Follow-up plan:   Return in about 3 months (around 06/01/2024) for MM, F2F.     Recent Visits No visits were found meeting these conditions. Showing recent visits within past 90 days and meeting all other requirements Today's Visits Date Type Provider Dept  02/29/24 Office Visit Edward Jolly, MD [facility]  Showing today's visits and meeting all other requirements Future Appointments No visits were found meeting these conditions. Showing future appointments within next 90 days and meeting all other requirements  I discussed the assessment and treatment plan with the patient. The patient was provided an opportunity to ask questions and all were answered. The patient agreed with the plan and demonstrated an understanding of the instructions.  Patient advised to call back or seek an in-person evaluation if the symptoms or condition worsens.  Duration of encounter: .  Total time on encounter, as per AMA guidelines included both the face-to-face and non-face-to-face time personally spent by the physician and/or other qualified health care professional(s) on the day of the encounter (includes time in activities that require the physician or other qualified health care professional and does not include time in activities normally performed  by clinical staff). Physician's time may include the following activities when performed: Preparing to see the patient (e.g., pre-charting review of records, searching for previously ordered imaging, lab work, and nerve conduction tests) Review of prior analgesic pharmacotherapies. Reviewing PMP Interpreting ordered tests (e.g., lab work, imaging, nerve conduction tests) Performing post-procedure evaluations, including interpretation of diagnostic procedures Obtaining and/or reviewing separately obtained history Performing a medically appropriate examination and/or evaluation Counseling and educating the patient/family/caregiver Ordering medications, tests, or procedures Referring and communicating with other health care professionals (when not separately reported) Documenting clinical information in the electronic or other health record Independently interpreting results (not separately reported) and communicating results to the patient/ family/caregiver Care coordination (not separately reported)  Note by: Edward Jolly, MD Date: 02/29/2024; Time: 8:46 AM

## 2024-02-29 NOTE — Patient Instructions (Signed)

## 2024-02-29 NOTE — Progress Notes (Signed)
 Nursing Pain Medication Assessment:  Safety precautions to be maintained throughout the outpatient stay will include: orient to surroundings, keep bed in low position, maintain call bell within reach at all times, provide assistance with transfer out of bed and ambulation.   Medication Inspection Compliance: Pill count conducted under aseptic conditions, in front of the patient. Neither the pills nor the bottle was removed from the patient's sight at any time. Once count was completed pills were immediately returned to the patient in their original bottle.  Medication #1: Buprenorphine (Suboxone) Pill/Patch Count:  58 of 60 patches remain Pill/Patch Appearance: Markings consistent with prescribed medication Bottle Appearance: Standard pharmacy container. Clearly labeled. Filled Date: 03 / 27 / 2025 Last Medication intake:  Today  Medication #2: Hydrocodone/APAP Pill/Patch Count:  21 of 120 pills remain Pill/Patch Appearance: Markings consistent with prescribed medication Bottle Appearance: Standard pharmacy container. Clearly labeled. Filled Date: 03 / 05 / 2025 Last Medication intake:  Today

## 2024-03-02 LAB — TOXASSURE SELECT 13 (MW), URINE

## 2024-03-30 DIAGNOSIS — Z1231 Encounter for screening mammogram for malignant neoplasm of breast: Secondary | ICD-10-CM | POA: Diagnosis not present

## 2024-03-30 LAB — HM MAMMOGRAPHY

## 2024-04-04 ENCOUNTER — Encounter: Payer: Self-pay | Admitting: Physician Assistant

## 2024-04-05 ENCOUNTER — Encounter: Payer: Self-pay | Admitting: Physician Assistant

## 2024-04-18 ENCOUNTER — Other Ambulatory Visit: Payer: Self-pay | Admitting: Physician Assistant

## 2024-04-18 DIAGNOSIS — I10 Essential (primary) hypertension: Secondary | ICD-10-CM

## 2024-04-18 DIAGNOSIS — R600 Localized edema: Secondary | ICD-10-CM

## 2024-04-18 DIAGNOSIS — I2510 Atherosclerotic heart disease of native coronary artery without angina pectoris: Secondary | ICD-10-CM

## 2024-05-30 ENCOUNTER — Encounter: Payer: Self-pay | Admitting: Student in an Organized Health Care Education/Training Program

## 2024-05-30 ENCOUNTER — Ambulatory Visit
Attending: Student in an Organized Health Care Education/Training Program | Admitting: Student in an Organized Health Care Education/Training Program

## 2024-05-30 DIAGNOSIS — Z79891 Long term (current) use of opiate analgesic: Secondary | ICD-10-CM | POA: Insufficient documentation

## 2024-05-30 DIAGNOSIS — G894 Chronic pain syndrome: Secondary | ICD-10-CM | POA: Diagnosis not present

## 2024-05-30 DIAGNOSIS — Z0289 Encounter for other administrative examinations: Secondary | ICD-10-CM | POA: Insufficient documentation

## 2024-05-30 MED ORDER — HYDROCODONE-ACETAMINOPHEN 5-325 MG PO TABS: 1.0000 | ORAL_TABLET | Freq: Four times a day (QID) | ORAL | 0 refills | Status: AC | PRN

## 2024-05-30 MED ORDER — HYDROCODONE-ACETAMINOPHEN 5-325 MG PO TABS: 1.0000 | ORAL_TABLET | Freq: Four times a day (QID) | ORAL | 0 refills | Status: DC | PRN

## 2024-05-30 MED ORDER — BELBUCA 75 MCG BU FILM: 1.0000 | ORAL_FILM | Freq: Two times a day (BID) | BUCCAL | 2 refills | Status: AC

## 2024-05-30 NOTE — Progress Notes (Signed)
 PROVIDER NOTE: Interpretation of information contained herein should be left to medically-trained personnel. Specific patient instructions are provided elsewhere under Patient Instructions section of medical record. This document was created in part using AI and STT-dictation technology, any transcriptional errors that may result from this process are unintentional.  Patient: Priscilla Meza  Service: E/M   PCP: Ostwalt, Janna, PA-C  DOB: [birth year]  DOS: 05/30/2024  Provider: Wallie Sherry, MD  MRN: [id number]  Delivery: Face-to-face  Specialty: Interventional Pain Management  Type: Established Patient  Setting: Ambulatory outpatient facility  Specialty designation: 09  Referring Prov.: Ostwalt, Janna, PA-C  Location: Outpatient office facility       History of present illness (HPI) [name], a 68 y.o. year old female, is here today because of her No primary diagnosis found.. [name]'s primary complain today is Back Pain (Lumbar midline )  Pertinent problems: Ms. Hillmer has Chronic bilateral low back pain without sciatica; Chronic narcotic use; Chronic pain syndrome; Spinal stenosis, lumbar region, with neurogenic claudication; Lumbar facet arthropathy; Encounter for long-term opiate analgesic use; Lumbar radiculopathy; and Pain management contract signed on their pertinent problem list.  Pain Assessment: Severity of Chronic pain is reported as a 3 /10. Location: Back Lower, Mid/denies. Onset: More than a month ago. Quality: Constant, Discomfort, Aching. Timing: Constant. Modifying factor(s): medications help and rest. Vitals:  height is 5' 1.5 (1.562 m) and weight is 151 lb (68.5 kg). Her temporal temperature is 97.3 F (36.3 C) (abnormal). Her blood pressure is 153/68 (abnormal) and her pulse is 73. Her respiration is 16 and oxygen saturation is 100%.  BMI: Estimated body mass index is 28.07 kg/m as calculated from the following:   Height as of this encounter: 5' 1.5 (1.562 m).   Weight  as of this encounter: 151 lb (68.5 kg).  Last encounter: 02/29/2024. Last procedure: Visit date not found.  Reason for encounter:   History of Present Illness   Priscilla Meza is a 68 year old female who presents for management of chronic pain and medication adjustment.  She is in the process of tapering off Belbuca , having started the 75 mcg dose yesterday, down from 150 mcg. She reports being 'okay so far' with the reduction. The cost of Belbuca , approximately $500 a month for both the 150 mcg and 75 mcg doses, is a significant factor in her decision to reduce the dosage.  She has recently initiated a treadmill exercise routine, starting last Saturday. She notes an increase in pain, which she attributes to the combination of the new exercise regimen and the medication adjustment. She is hopeful that her pain will decrease as she becomes accustomed to the exercise.  She is currently taking hydrocodone , 5 mg tablets, four times a day, though she sometimes takes only three pills a day. Her last refill was on June 5th, and she plans to refill it on July 6th.       Pharmacotherapy Assessment   Analgesic:Belbuca  450-->300-->150 (at next dose)-->75 mcg bid there after mcg every 12 hours, hydrocodone  10 mg every 6 hours as needed  Monitoring: [place] PMP: PDMP not reviewed this encounter.       Pharmacotherapy: No side-effects or adverse reactions reported. Compliance: No problems identified. Effectiveness: Clinically acceptable.  Priscilla Chrissie MATSU, RN  05/30/2024  8:20 AM  Sign when Signing Visit Nursing Pain Medication Assessment:  Safety precautions to be maintained throughout the outpatient stay will include: orient to surroundings, keep bed in low position, maintain call  bell within reach at all times, provide assistance with transfer out of bed and ambulation.  Medication Inspection Compliance: Pill count conducted under aseptic conditions, in front of the patient. Neither the pills nor  the bottle was removed from the patient's sight at any time. Once count was completed pills were immediately returned to the patient in their original bottle.  Medication #1: Hydrocodone /APAP Pill/Patch Count: 22 of 120 pills/patches remain Pill/Patch Appearance: Markings consistent with prescribed medication Bottle Appearance: Standard pharmacy container. Clearly labeled. Filled Date: 06 / 05 / 2025 Last Medication intake:  Today  Medication #2: Buprenorphine  (Suboxone) Pill/Patch Count: 57/60 films Pill/Patch Appearance: Markings consistent with prescribed medication Bottle Appearance: Standard pharmacy container. Clearly labeled. Filled Date: 06 / 26 / 2025 Last Medication intake:  Today  UDS:  Summary  Date Value Ref Range Status  02/29/2024 FINAL  Final    Comment:    ==================================================================== [facility] (MW) ==================================================================== Test                             Result       Flag       Units  Drug Present and Declared for Prescription Verification   Hydrocodone                     2444         EXPECTED   ng/mg creat   Hydromorphone                  202          EXPECTED   ng/mg creat   Dihydrocodeine                 370          EXPECTED   ng/mg creat   Norhydrocodone                 3477         EXPECTED   ng/mg creat    Sources of hydrocodone  include scheduled prescription medications.    Hydromorphone, dihydrocodeine and norhydrocodone are expected    metabolites of hydrocodone . Hydromorphone and dihydrocodeine are    also available as scheduled prescription medications.    Buprenorphine                   67           EXPECTED   ng/mg creat   Norbuprenorphine               73           EXPECTED   ng/mg creat    Source of buprenorphine  is a scheduled prescription medication.    Norbuprenorphine is an expected metabolite of  buprenorphine .  ==================================================================== Test                      Result    Flag   Units      Ref Range   Creatinine              66               mg/dL      >=79 ==================================================================== Declared Medications:  The flagging and interpretation on this report are based on the  following declared medications.  Unexpected results may arise from  inaccuracies in the declared medications.   **Note: The testing scope of this panel includes these  medications:   Hydrocodone  (Norco)   **Note: The testing scope of this panel does not include small to  moderate amounts of these reported medications:   Buprenorphine  (Belbuca )   **Note: The testing scope of this panel does not include the  following reported medications:   Acetaminophen  (Norco)  Hydrochlorothiazide  (Microzide )  Lisinopril  (Zestril )  Rosuvastatin  (Crestor ) ==================================================================== For clinical consultation, please call [phone]. ====================================================================     No results found for: CBDTHCR No results found for: D8THCCBX No results found for: D9THCCBX  ROS  Constitutional: Denies any fever or chills Gastrointestinal: No reported hemesis, hematochezia, vomiting, or acute GI distress Musculoskeletal: Denies any acute onset joint swelling, redness, loss of ROM, or weakness Neurological: No reported episodes of acute onset apraxia, aphasia, dysarthria, agnosia, amnesia, paralysis, loss of coordination, or loss of consciousness  Medication Review  Buprenorphine  HCl, HYDROcodone -acetaminophen , hydrochlorothiazide , lisinopril , and rosuvastatin   History Review  Allergy: Ms. Kraker has no known allergies. Drug: Ms. Caratachea  reports no history of drug use. Alcohol:  reports no history of alcohol use. Tobacco:  reports that she has quit smoking.  She has never used smokeless tobacco. Social: Ms. Rembert  reports that she has quit smoking. She has never used smokeless tobacco. She reports that she does not drink alcohol and does not use drugs. Medical:  has a past medical history of Frequent headaches, GERD (gastroesophageal reflux disease), Hypertension, and Urine incontinence. Surgical: Ms. Dogan  has a past surgical history that includes Abdominal hysterectomy (1985) and Tubal ligation. Family: family history includes Breast cancer in her maternal aunt; Colon cancer in her maternal grandmother.  Laboratory Chemistry Profile   Renal Lab Results  Component Value Date   BUN 18 10/19/2023   CREATININE 0.91 10/19/2023   BCR 20 10/19/2023   GFRAA 72 01/04/2020   GFRNONAA 62 01/04/2020    Hepatic Lab Results  Component Value Date   AST 14 10/19/2023   ALT 8 10/19/2023   ALBUMIN 4.0 10/19/2023   ALKPHOS 92 10/19/2023   LIPASE 137 12/01/2014    Electrolytes Lab Results  Component Value Date   NA 142 10/19/2023   K 4.1 10/19/2023   CL 108 (H) 10/19/2023   CALCIUM  9.3 10/19/2023    Bone No results found for: VD25OH, VD125OH2TOT, CI6874NY7, CI7874NY7, 25OHVITD1, 25OHVITD2, 25OHVITD3, TESTOFREE, TESTOSTERONE  Inflammation (CRP: Acute Phase) (ESR: Chronic Phase) No results found for: CRP, ESRSEDRATE, LATICACIDVEN       Note: Above Lab results reviewed.  Recent Imaging Review  DG Lumbar Spine Complete W/Bend CLINICAL DATA:  Low back pain.  EXAM: LUMBAR SPINE - COMPLETE WITH BENDING VIEWS  COMPARISON:  None.  FINDINGS: 5 nonrib bearing lumbar-type vertebral bodies.  Vertebral body heights are maintained. No acute fracture. Generalized osteopenia.  Minimal grade 1 anterolisthesis of L4 on L5 and L5 on S1 secondary to facet disease. Minimal retrolisthesis of L2 on L3. No dynamic listhesis. No spondylolysis.  Degenerative disease with mild disc height loss at T12-L1, L1-2, L2-3 and L4-5.  Degenerative disease with disc height loss at L5-S1. Bilateral facet arthropathy at L4-5 and L5-S1.  SI joints are unremarkable.  Abdominal aortic atherosclerosis.  IMPRESSION: 1. Lumbar spine spondylosis as described above. 2. No acute osseous injury of the lumbar spine.  Electronically Signed   By: Julaine Blanch M.D.   On: 12/27/2021 07:26 Note: Reviewed        Physical Exam  Vitals: BP (!) 153/68 (BP Location: Right Arm, Patient Position: Sitting, Cuff Size: Normal)   Pulse 73  Temp (!) 97.3 F (36.3 C) (Temporal)   Resp 16   Ht 5' 1.5 (1.562 m)   Wt 151 lb (68.5 kg)   SpO2 100%   BMI 28.07 kg/m  BMI: Estimated body mass index is 28.07 kg/m as calculated from the following:   Height as of this encounter: 5' 1.5 (1.562 m).   Weight as of this encounter: 151 lb (68.5 kg). Ideal: Ideal body weight: 48.9 kg (107 lb 14.6 oz) Adjusted ideal body weight: 56.8 kg (125 lb 2.4 oz) General appearance: Well nourished, well developed, and well hydrated. In no apparent acute distress Mental status: Alert, oriented x 3 (person, place, & time)       Respiratory: No evidence of acute respiratory distress Eyes: PERLA Assessment   Diagnosis Status  1. Chronic pain syndrome   2. Pain management contract signed   3. Encounter for long-term opiate analgesic use    Controlled Controlled Controlled   Updated Problems: No problems updated.  Plan of Care  Problem-specific:  Assessment and Plan    Chronic pain   Chronic pain management continues with Belbuca  and hydrocodone . The recent reduction of Belbuca  from 150 mcg to 75 mcg twice daily may increase pain perception. A new exercise regimen might also cause discomfort due to muscle stress. The goal is to gradually reduce opioid use while effectively managing pain. She is motivated to decrease medication use due to cost and potential side effects, considering further reduction to once daily dosing if tolerated. Exercise is expected  to improve core strength and potentially reduce pain over time. Continue Belbuca  75 mcg twice daily for two more months. Refill hydrocodone , with the next refill due on July 6. Encourage continuation of the exercise regimen. Consider reducing Belbuca  to once daily if tolerated. Schedule a follow-up appointment in three months. Ensure urine drug screen is up to date.       Ms. AMADI YOSHINO has a current medication list which includes the following long-term medication(s): hydrochlorothiazide , lisinopril , and rosuvastatin .  Pharmacotherapy (Medications Ordered): Meds ordered this encounter  Medications   Buprenorphine  HCl (BELBUCA ) 75 MCG FILM    Sig: Place 1 Film inside cheek every 12 (twelve) hours.    Dispense:  60 Film    Refill:  2    Chronic Pain: STOP Act (Not applicable) Fill 1 day early if closed on refill date. Avoid benzodiazepines within 8 hours of opioids   HYDROcodone -acetaminophen  (NORCO/VICODIN) 5-325 MG tablet    Sig: Take 1 tablet by mouth every 6 (six) hours as needed for severe pain (pain score 7-10). Must last 30 days.    Dispense:  120 tablet    Refill:  0    Chronic Pain: STOP Act (Not applicable) Fill 1 day early if closed on refill date. Avoid benzodiazepines within 8 hours of opioids   HYDROcodone -acetaminophen  (NORCO/VICODIN) 5-325 MG tablet    Sig: Take 1 tablet by mouth every 6 (six) hours as needed for severe pain (pain score 7-10). Must last 30 days.    Dispense:  120 tablet    Refill:  0    Chronic Pain: STOP Act (Not applicable) Fill 1 day early if closed on refill date. Avoid benzodiazepines within 8 hours of opioids   HYDROcodone -acetaminophen  (NORCO/VICODIN) 5-325 MG tablet    Sig: Take 1 tablet by mouth every 6 (six) hours as needed for severe pain (pain score 7-10). Must last 30 days.    Dispense:  120 tablet    Refill:  0  Chronic Pain: STOP Act (Not applicable) Fill 1 day early if closed on refill date. Avoid benzodiazepines within 8 hours of  opioids   Orders:  No orders of the defined types were placed in this encounter.   Return in about 3 months (around 08/30/2024) for MM, F2F.    Recent Visits No visits were found meeting these conditions. Showing recent visits within past 90 days and meeting all other requirements Today's Visits Date Type Provider Dept  05/30/24 Office Visit Marcelino Nurse, MD [facility]  Showing today's visits and meeting all other requirements Future Appointments Date Type Provider Dept  08/24/24 Appointment Marcelino Nurse, MD [facility]  Showing future appointments within next 90 days and meeting all other requirements  I discussed the assessment and treatment plan with the patient. The patient was provided an opportunity to ask questions and all were answered. The patient agreed with the plan and demonstrated an understanding of the instructions.  Patient advised to call back or seek an in-person evaluation if the symptoms or condition worsens.  Duration of encounter: .  Total time on encounter, as per AMA guidelines included both the face-to-face and non-face-to-face time personally spent by the physician and/or other qualified health care professional(s) on the day of the encounter (includes time in activities that require the physician or other qualified health care professional and does not include time in activities normally performed by clinical staff). Physician's time may include the following activities when performed: Preparing to see the patient (e.g., pre-charting review of records, searching for previously ordered imaging, lab work, and nerve conduction tests) Review of prior analgesic pharmacotherapies. Reviewing PMP Interpreting ordered tests (e.g., lab work, imaging, nerve conduction tests) Performing post-procedure evaluations, including interpretation of diagnostic procedures Obtaining and/or reviewing separately obtained history Performing a medically  appropriate examination and/or evaluation Counseling and educating the patient/family/caregiver Ordering medications, tests, or procedures Referring and communicating with other health care professionals (when not separately reported) Documenting clinical information in the electronic or other health record Independently interpreting results (not separately reported) and communicating results to the patient/ family/caregiver Care coordination (not separately reported)  Note by: Nurse Marcelino, MD (TTS and AI technology used. I apologize for any typographical errors that were not detected and corrected.) Date: 05/30/2024; Time: 8:38 AM

## 2024-05-30 NOTE — Progress Notes (Signed)
 Nursing Pain Medication Assessment:  Safety precautions to be maintained throughout the outpatient stay will include: orient to surroundings, keep bed in low position, maintain call bell within reach at all times, provide assistance with transfer out of bed and ambulation.  Medication Inspection Compliance: Pill count conducted under aseptic conditions, in front of the patient. Neither the pills nor the bottle was removed from the patient's sight at any time. Once count was completed pills were immediately returned to the patient in their original bottle.  Medication #1: Hydrocodone /APAP Pill/Patch Count: 22 of 120 pills/patches remain Pill/Patch Appearance: Markings consistent with prescribed medication Bottle Appearance: Standard pharmacy container. Clearly labeled. Filled Date: 06 / 05 / 2025 Last Medication intake:  Today  Medication #2: Buprenorphine  (Suboxone) Pill/Patch Count: 57/60 films Pill/Patch Appearance: Markings consistent with prescribed medication Bottle Appearance: Standard pharmacy container. Clearly labeled. Filled Date: 06 / 26 / 2025 Last Medication intake:  Today

## 2024-06-17 ENCOUNTER — Other Ambulatory Visit: Payer: Self-pay

## 2024-06-17 ENCOUNTER — Inpatient Hospital Stay
Admission: EM | Admit: 2024-06-17 | Discharge: 2024-06-19 | DRG: 378 | Disposition: A | Discharge status: Home / Self Care | Attending: Obstetrics and Gynecology | Admitting: Obstetrics and Gynecology

## 2024-06-17 DIAGNOSIS — R531 Weakness: Secondary | ICD-10-CM | POA: Diagnosis not present

## 2024-06-17 DIAGNOSIS — K635 Polyp of colon: Secondary | ICD-10-CM | POA: Diagnosis present

## 2024-06-17 DIAGNOSIS — K219 Gastro-esophageal reflux disease without esophagitis: Secondary | ICD-10-CM | POA: Diagnosis not present

## 2024-06-17 DIAGNOSIS — G894 Chronic pain syndrome: Secondary | ICD-10-CM | POA: Diagnosis not present

## 2024-06-17 DIAGNOSIS — K922 Gastrointestinal hemorrhage, unspecified: Secondary | ICD-10-CM | POA: Diagnosis not present

## 2024-06-17 DIAGNOSIS — I10 Essential (primary) hypertension: Secondary | ICD-10-CM | POA: Diagnosis present

## 2024-06-17 DIAGNOSIS — E7800 Pure hypercholesterolemia, unspecified: Secondary | ICD-10-CM | POA: Diagnosis not present

## 2024-06-17 DIAGNOSIS — D128 Benign neoplasm of rectum: Secondary | ICD-10-CM | POA: Diagnosis present

## 2024-06-17 DIAGNOSIS — K621 Rectal polyp: Secondary | ICD-10-CM | POA: Diagnosis not present

## 2024-06-17 DIAGNOSIS — R7303 Prediabetes: Secondary | ICD-10-CM | POA: Diagnosis not present

## 2024-06-17 DIAGNOSIS — K449 Diaphragmatic hernia without obstruction or gangrene: Secondary | ICD-10-CM | POA: Diagnosis present

## 2024-06-17 DIAGNOSIS — K3189 Other diseases of stomach and duodenum: Secondary | ICD-10-CM | POA: Diagnosis present

## 2024-06-17 DIAGNOSIS — N179 Acute kidney failure, unspecified: Secondary | ICD-10-CM | POA: Diagnosis not present

## 2024-06-17 DIAGNOSIS — F17290 Nicotine dependence, other tobacco product, uncomplicated: Secondary | ICD-10-CM | POA: Diagnosis not present

## 2024-06-17 DIAGNOSIS — Z800 Family history of malignant neoplasm of digestive organs: Secondary | ICD-10-CM

## 2024-06-17 DIAGNOSIS — K640 First degree hemorrhoids: Secondary | ICD-10-CM | POA: Diagnosis not present

## 2024-06-17 DIAGNOSIS — K921 Melena: Secondary | ICD-10-CM | POA: Diagnosis not present

## 2024-06-17 DIAGNOSIS — K2950 Unspecified chronic gastritis without bleeding: Secondary | ICD-10-CM | POA: Diagnosis not present

## 2024-06-17 DIAGNOSIS — K2981 Duodenitis with bleeding: Secondary | ICD-10-CM | POA: Diagnosis not present

## 2024-06-17 DIAGNOSIS — Z803 Family history of malignant neoplasm of breast: Secondary | ICD-10-CM | POA: Diagnosis not present

## 2024-06-17 DIAGNOSIS — K254 Chronic or unspecified gastric ulcer with hemorrhage: Secondary | ICD-10-CM | POA: Diagnosis not present

## 2024-06-17 DIAGNOSIS — D500 Iron deficiency anemia secondary to blood loss (chronic): Secondary | ICD-10-CM | POA: Diagnosis not present

## 2024-06-17 DIAGNOSIS — R011 Cardiac murmur, unspecified: Secondary | ICD-10-CM | POA: Diagnosis present

## 2024-06-17 DIAGNOSIS — K264 Chronic or unspecified duodenal ulcer with hemorrhage: Secondary | ICD-10-CM | POA: Diagnosis not present

## 2024-06-17 DIAGNOSIS — Z90710 Acquired absence of both cervix and uterus: Secondary | ICD-10-CM | POA: Diagnosis not present

## 2024-06-17 DIAGNOSIS — D62 Acute posthemorrhagic anemia: Secondary | ICD-10-CM | POA: Diagnosis not present

## 2024-06-17 DIAGNOSIS — K2980 Duodenitis without bleeding: Secondary | ICD-10-CM | POA: Diagnosis not present

## 2024-06-17 DIAGNOSIS — Z8719 Personal history of other diseases of the digestive system: Secondary | ICD-10-CM | POA: Diagnosis not present

## 2024-06-17 DIAGNOSIS — I2510 Atherosclerotic heart disease of native coronary artery without angina pectoris: Secondary | ICD-10-CM | POA: Diagnosis not present

## 2024-06-17 DIAGNOSIS — Z79899 Other long term (current) drug therapy: Secondary | ICD-10-CM

## 2024-06-17 DIAGNOSIS — K2951 Unspecified chronic gastritis with bleeding: Secondary | ICD-10-CM | POA: Diagnosis not present

## 2024-06-17 LAB — URINALYSIS, ROUTINE W REFLEX MICROSCOPIC
Bilirubin Urine: NEGATIVE
Glucose, UA: NEGATIVE mg/dL
Hgb urine dipstick: NEGATIVE
Ketones, ur: NEGATIVE mg/dL
Nitrite: NEGATIVE
Protein, ur: NEGATIVE mg/dL
Specific Gravity, Urine: 1.015 (ref 1.005–1.030)
pH: 5 (ref 5.0–8.0)

## 2024-06-17 LAB — IRON AND TIBC
Iron: 115 ug/dL (ref 28–170)
Saturation Ratios: 34 % — ABNORMAL HIGH (ref 10.4–31.8)
TIBC: 340 ug/dL (ref 250–450)
UIBC: 225 ug/dL

## 2024-06-17 LAB — CBC
HCT: 21.1 % — ABNORMAL LOW (ref 36.0–46.0)
Hemoglobin: 7.2 g/dL — ABNORMAL LOW (ref 12.0–15.0)
MCH: 30.1 pg (ref 26.0–34.0)
MCHC: 34.1 g/dL (ref 30.0–36.0)
MCV: 88.3 fL (ref 80.0–100.0)
Platelets: 220 K/uL (ref 150–400)
RBC: 2.39 MIL/uL — ABNORMAL LOW (ref 3.87–5.11)
RDW: 12.6 % (ref 11.5–15.5)
WBC: 18.6 K/uL — ABNORMAL HIGH (ref 4.0–10.5)
nRBC: 0 % (ref 0.0–0.2)

## 2024-06-17 LAB — FOLATE: Folate: 36 ng/mL (ref >5.9)

## 2024-06-17 LAB — COMPREHENSIVE METABOLIC PANEL WITH GFR
ALT: 11 U/L (ref 0–44)
AST: 22 U/L (ref 15–41)
Albumin: 3.8 g/dL (ref 3.5–5.0)
Alkaline Phosphatase: 51 U/L (ref 38–126)
Anion gap: 10 (ref 5–15)
BUN: 92 mg/dL — ABNORMAL HIGH (ref 8–23)
CO2: 20 mmol/L — ABNORMAL LOW (ref 22–32)
Calcium: 9.7 mg/dL (ref 8.9–10.3)
Chloride: 106 mmol/L (ref 98–111)
Creatinine, Ser: 1.45 mg/dL — ABNORMAL HIGH (ref 0.44–1.00)
GFR, Estimated: 40 mL/min — ABNORMAL LOW (ref >60)
Glucose, Bld: 160 mg/dL — ABNORMAL HIGH (ref 70–99)
Potassium: 4.4 mmol/L (ref 3.5–5.1)
Sodium: 136 mmol/L (ref 135–145)
Total Bilirubin: 0.7 mg/dL (ref 0.0–1.2)
Total Protein: 6.7 g/dL (ref 6.5–8.1)

## 2024-06-17 LAB — CBG MONITORING, ED: Glucose-Capillary: 145 mg/dL — ABNORMAL HIGH (ref 70–99)

## 2024-06-17 LAB — FERRITIN: Ferritin: 49 ng/mL (ref 11–307)

## 2024-06-17 MED ORDER — HYDROCODONE-ACETAMINOPHEN 5-325 MG PO TABS
1.0000 | ORAL_TABLET | Freq: Four times a day (QID) | ORAL | Status: DC | PRN
  Administered 2024-06-17 – 2024-06-19 (×5): 1 via ORAL
  Filled 2024-06-17 (×5): qty 1

## 2024-06-17 MED ORDER — SODIUM CHLORIDE 0.9 % IV BOLUS
500.0000 mL | Freq: Once | INTRAVENOUS | Status: AC
  Administered 2024-06-17: 500 mL via INTRAVENOUS

## 2024-06-17 MED ORDER — SODIUM CHLORIDE 0.9 % IV BOLUS
1000.0000 mL | Freq: Once | INTRAVENOUS | Status: AC
  Administered 2024-06-17: 1000 mL via INTRAVENOUS

## 2024-06-17 MED ORDER — ONDANSETRON HCL 4 MG PO TABS: 4.0000 mg | ORAL_TABLET | Freq: Four times a day (QID) | ORAL | Status: DC | PRN

## 2024-06-17 MED ORDER — ONDANSETRON HCL 4 MG/2ML IJ SOLN
4.0000 mg | Freq: Four times a day (QID) | INTRAMUSCULAR | Status: DC | PRN
Start: 2024-06-17 — End: 2024-06-19
  Administered 2024-06-17 – 2024-06-19 (×4): 4 mg via INTRAVENOUS
  Filled 2024-06-17 (×4): qty 2

## 2024-06-17 MED ORDER — ACETAMINOPHEN 325 MG PO TABS
650.0000 mg | ORAL_TABLET | Freq: Four times a day (QID) | ORAL | Status: DC | PRN
Start: 2024-06-17 — End: 2024-06-19
  Administered 2024-06-18: 650 mg via ORAL
  Filled 2024-06-17: qty 2

## 2024-06-17 MED ORDER — POLYETHYLENE GLYCOL 3350 17 GM/SCOOP PO POWD
238.0000 g | Freq: Once | ORAL | Status: AC
  Administered 2024-06-17: 238 g via ORAL
  Filled 2024-06-17: qty 238

## 2024-06-17 MED ORDER — BUPRENORPHINE HCL 75 MCG BU FILM: 1.0000 | ORAL_FILM | Freq: Two times a day (BID) | BUCCAL | Status: DC

## 2024-06-17 MED ORDER — PANTOPRAZOLE SODIUM 40 MG IV SOLR
80.0000 mg | Freq: Once | INTRAVENOUS | Status: AC
  Administered 2024-06-17: 80 mg via INTRAVENOUS
  Filled 2024-06-17: qty 20

## 2024-06-17 MED ORDER — BUPRENORPHINE HCL 2 MG SL SUBL
2.0000 mg | SUBLINGUAL_TABLET | Freq: Once | SUBLINGUAL | Status: AC
  Administered 2024-06-18: 2 mg via SUBLINGUAL
  Filled 2024-06-17: qty 1

## 2024-06-17 MED ORDER — SODIUM CHLORIDE 0.9 % IV SOLN: INTRAVENOUS | Status: AC

## 2024-06-17 MED ORDER — ROSUVASTATIN CALCIUM 10 MG PO TABS: 40.0000 mg | ORAL_TABLET | Freq: Every day | ORAL | Status: DC

## 2024-06-17 MED ORDER — PANTOPRAZOLE SODIUM 40 MG IV SOLR: 40.0000 mg | Freq: Once | INTRAVENOUS | Status: DC

## 2024-06-17 MED ORDER — SODIUM CHLORIDE 0.9% IV SOLUTION
Freq: Once | INTRAVENOUS | Status: AC
  Filled 2024-06-17: qty 250

## 2024-06-17 MED ORDER — ACETAMINOPHEN 650 MG RE SUPP: 650.0000 mg | Freq: Four times a day (QID) | RECTAL | Status: DC | PRN

## 2024-06-17 MED ORDER — PANTOPRAZOLE SODIUM 40 MG IV SOLR
40.0000 mg | Freq: Two times a day (BID) | INTRAVENOUS | Status: DC
  Administered 2024-06-17: 40 mg via INTRAVENOUS
  Filled 2024-06-17: qty 10

## 2024-06-17 NOTE — ED Provider Notes (Signed)
 [facility] Provider Note   Event Date/Time   First MD Initiated Contact with Patient 06/17/24 1358     (approximate) History  Weakness  HPI JENESIS MARTIN is a 68 y.o. female With a stated past medical history of bleeding gastric ulcers who presents complaining of melena and generalized weakness that has been worsening over the last 4 days.  Patient also had nausea/vomiting that began today.  Patient denies any blood thinner use.  Patient does endorse NSAID use ROS: Patient currently denies any vision changes, tinnitus, difficulty speaking, facial droop, sore throat, chest pain, shortness of breath, abdominal pain, diarrhea, dysuria, or weakness/numbness/paresthesias in any extremity   Physical Exam  Triage Vital Signs: ED Triage Vitals [06/17/24 1348]  Encounter Vitals Group     BP (!) 132/43     Girls Systolic BP Percentile      Girls Diastolic BP Percentile      Boys Systolic BP Percentile      Boys Diastolic BP Percentile      Pulse Rate 89     Resp 17     Temp 98.4 F (36.9 C)     Temp Source Oral     SpO2 100 %     Weight 149 lb 14.6 oz (68 kg)     Height 5' 1 (1.549 m)     Head Circumference      Peak Flow      Pain Score 0     Pain Loc      Pain Education      Exclude from Growth Chart    Most recent vital signs: Vitals:   06/18/24 0105 06/18/24 0558  BP: (!) 109/41 (!) 106/48  Pulse: 86 82  Resp: 18 18  Temp: 98.6 F (37 C) 98.4 F (36.9 C)  SpO2: 98% 99%   General: Awake, oriented x4. CV:  Good peripheral perfusion. Resp:  Normal effort. Abd:  No distention. Other:  Elderly overweight Caucasian female resting comfortably in no acute distress ED Results / Procedures / Treatments  Labs (all labs ordered are listed, but only abnormal results are displayed) Labs Reviewed  COMPREHENSIVE METABOLIC PANEL WITH GFR - Abnormal; Notable for the following components:      Result Value   CO2 20 (*)    Glucose, Bld 160 (*)    BUN 92  (*)    Creatinine, Ser 1.45 (*)    GFR, Estimated 40 (*)    All other components within normal limits  CBC - Abnormal; Notable for the following components:   WBC 18.6 (*)    RBC 2.39 (*)    Hemoglobin 7.2 (*)    HCT 21.1 (*)    All other components within normal limits  URINALYSIS, ROUTINE W REFLEX MICROSCOPIC - Abnormal; Notable for the following components:   Color, Urine YELLOW (*)    APPearance HAZY (*)    Leukocytes,Ua SMALL (*)    Bacteria, UA RARE (*)    All other components within normal limits  CBC - Abnormal; Notable for the following components:   WBC 11.0 (*)    RBC 2.75 (*)    Hemoglobin 8.2 (*)    HCT 23.8 (*)    Platelets 127 (*)    All other components within normal limits  COMPREHENSIVE METABOLIC PANEL WITH GFR - Abnormal; Notable for the following components:   Chloride 114 (*)    CO2 21 (*)    Glucose, Bld 100 (*)    BUN  66 (*)    Creatinine, Ser 1.01 (*)    Calcium  8.5 (*)    Total Protein 5.0 (*)    Albumin 2.7 (*)    Alkaline Phosphatase 35 (*)    Anion gap 4 (*)    All other components within normal limits  IRON AND TIBC - Abnormal; Notable for the following components:   Saturation Ratios 34 (*)    All other components within normal limits  VITAMIN B12 - Abnormal; Notable for the following components:   Vitamin B-12 1,432 (*)    All other components within normal limits  CBG MONITORING, ED - Abnormal; Notable for the following components:   Glucose-Capillary 145 (*)    All other components within normal limits  FERRITIN  FOLATE  HIV ANTIBODY (ROUTINE TESTING W REFLEX)  TYPE AND SCREEN  PREPARE RBC (CROSSMATCH)  ABO/RH  PROCEDURES: Critical Care performed: Yes, see critical care procedure note(s) CRITICAL CARE Performed by: Romney Compean K Elia Keenum  Total critical care time: 33 minutes  Critical care time was exclusive of separately billable procedures and treating other patients.  Critical care was necessary to treat or prevent imminent or  life-threatening deterioration.  Critical care was time spent personally by me on the following activities: development of treatment plan with patient and/or surrogate as well as nursing, discussions with consultants, evaluation of patient's response to treatment, examination of patient, obtaining history from patient or surrogate, ordering and performing treatments and interventions, ordering and review of laboratory studies, ordering and review of radiographic studies, pulse oximetry and re-evaluation of patient's condition.  Procedures MEDICATIONS ORDERED IN ED: Medications  pantoprazole  (PROTONIX ) injection 40 mg (40 mg Intravenous Given 06/17/24 2230)  acetaminophen  (TYLENOL ) tablet 650 mg (has no administration in time range)    Or  acetaminophen  (TYLENOL ) suppository 650 mg (has no administration in time range)  ondansetron  (ZOFRAN ) tablet 4 mg ( Oral See Alternative 06/17/24 1557)    Or  ondansetron  (ZOFRAN ) injection 4 mg (4 mg Intravenous Given 06/17/24 1557)  HYDROcodone -acetaminophen  (NORCO/VICODIN) 5-325 MG per tablet 1 tablet (1 tablet Oral Given 06/18/24 0618)  Buprenorphine  HCl FILM 1 Film (1 Film Buccal Not Given 06/17/24 2251)  rosuvastatin  (CRESTOR ) tablet 40 mg (has no administration in time range)  0.9 %  sodium chloride  infusion ( Intravenous Infusion Verify 06/18/24 0643)  0.9 %  sodium chloride  infusion (0 mLs Intravenous Stopped 06/18/24 0603)  Oral care mouth rinse (has no administration in time range)  0.9 %  sodium chloride  infusion (Manually program via Guardrails IV Fluids) ( Intravenous New Bag/Given 06/17/24 1706)  pantoprazole  (PROTONIX ) injection 80 mg (80 mg Intravenous Given 06/17/24 1557)  sodium chloride  0.9 % bolus 1,000 mL (0 mLs Intravenous Stopped 06/17/24 1825)  sodium chloride  0.9 % bolus 500 mL (500 mLs Intravenous New Bag/Given 06/17/24 1825)  polyethylene glycol powder (GLYCOLAX /MIRALAX ) container 238 g (238 g Oral Given 06/17/24 2000)  sodium chloride  0.9  % bolus 500 mL (500 mLs Intravenous New Bag/Given 06/17/24 1945)  buprenorphine  (SUBUTEX ) SL tablet 2 mg (2 mg Sublingual Given 06/18/24 0053)   IMPRESSION / MDM / ASSESSMENT AND PLAN / ED COURSE  I reviewed the triage vital signs and the nursing notes.                             The patient is on the cardiac monitor to evaluate for evidence of arrhythmia and/or significant heart rate changes. Patient's presentation is most consistent with acute  presentation with potential threat to life or bodily function. + abdominal pain + black stool per rectum Given history and exam patient's presentation most consistent with upper GI bleed possibly secondary to peptic ulcer disease or variceal bleeding. I have low suspicion for aortoenteric fistula, ENT bleeding mimic, Boerhaave's, Pulmonary bleeding mimic.  Workup: CBC, BMP, LFTs, Lipase, PT/INR, Type and Screen  Interventions: Analgesia and antiemetic medications PRN Protonix  40mg  IVP PRBC transfusion  Findings: Hb: 7.2  Disposition: Admit for close monitoring.   FINAL CLINICAL IMPRESSION(S) / ED DIAGNOSES   Final diagnoses:  Generalized weakness  Melena  Acute upper GI bleeding   Rx / DC Orders   ED Discharge Orders     None      Note:  This document was prepared using Dragon voice recognition software and may include unintentional dictation errors.   Jossie Artist POUR, MD 06/18/24 [phone]

## 2024-06-17 NOTE — H&P (Signed)
 History and Physical    Patient: Priscilla Meza FMW:[id number] DOB: 01-29-1956 DOA: 06/17/2024 DOS: the patient was seen and examined on 06/17/2024 PCP: Ostwalt, Janna, PA-C  Patient coming from: Home  Chief Complaint:  Chief Complaint  Patient presents with   Weakness   HPI: Priscilla Meza is a 68 y.o. female with medical history significant of glucose intolerance, hyperlipidemia, hypertension, history of GI bleed, arteriosclerotic cardiovascular disease, chronic back pain, lumbar radiculopathy who presented to the emergency department with generalized weakness, postural dizziness after having multiple episodes of melena over the past 2-3 days.  She also had nausea and vomiting earlier today.  No coffee grounds or hematemesis.    No abdominal pain, diarrhea, constipation or hematochezia. She recently used 2 tablets of naproxen last weekend.  She denied fever, chills, rhinorrhea, sore throat, wheezing or hemoptysis.  No chest pain, palpitations, diaphoresis, PND, orthopnea or pitting edema of the lower extremities. No flank pain, dysuria, frequency or hematuria.  No polyuria, polydipsia, polyphagia or blurred vision.   Lab work: Urinalysis shows small leukocyte esterase and rare bacteria microscopic examination.  CBC showed white count 18.6, hemoglobin 7.2 g/dL platelets 779.  CMP showed a CO2 of 20 mmol/L with a normal anion gap.  Glucose 160, BUN 92 and creatinine 1.45 mg/dL (her most recent creatinine level was 0.91 mg/dL), the rest of the electrolytes and hepatic functions were normal.  ED course: Initial vital signs were temperature 98.4 F, pulse 89, respiration 17, BP 132/43 mmHg O2 sat 100% on room air.  The patient received Protonix  80 mg IVP in the emergency department.  I added 1000 mL normal saline bolus.   Review of Systems: As mentioned in the history of present illness. All other systems reviewed and are negative. Past Medical History:  Diagnosis Date   Frequent headaches    GERD  (gastroesophageal reflux disease)    Hypertension    Urine incontinence    Past Surgical History:  Procedure Laterality Date   ABDOMINAL HYSTERECTOMY  1985   TUBAL LIGATION     Social History:  reports that she has quit smoking. She has never used smokeless tobacco. She reports that she does not drink alcohol and does not use drugs.  No Known Allergies  Family History  Problem Relation Age of Onset   Breast cancer Maternal Aunt    Colon cancer Maternal Grandmother     Prior to Admission medications   Medication Sig Start Date End Date Taking? Authorizing Provider  Buprenorphine  HCl (BELBUCA ) 75 MCG FILM Place 1 Film inside cheek every 12 (twelve) hours. 05/30/24 06/29/24  Marcelino Nurse, MD  hydrochlorothiazide  (MICROZIDE ) 12.5 MG capsule TAKE 1 CAPSULE(12.5 MG) BY MOUTH DAILY 04/18/24   Ostwalt, Janna, PA-C  HYDROcodone -acetaminophen  (NORCO/VICODIN) 5-325 MG tablet Take 1 tablet by mouth every 6 (six) hours as needed for severe pain (pain score 7-10). Must last 30 days. 06/03/24 07/03/24  Marcelino Nurse, MD  HYDROcodone -acetaminophen  (NORCO/VICODIN) 5-325 MG tablet Take 1 tablet by mouth every 6 (six) hours as needed for severe pain (pain score 7-10). Must last 30 days. 07/03/24 08/02/24  Lateef, Bilal, MD  HYDROcodone -acetaminophen  (NORCO/VICODIN) 5-325 MG tablet Take 1 tablet by mouth every 6 (six) hours as needed for severe pain (pain score 7-10). Must last 30 days. 08/02/24 09/01/24  Marcelino Nurse, MD  lisinopril  (ZESTRIL ) 20 MG tablet Take 2 tablets (40 mg total) by mouth daily. 02/22/24   Ostwalt, Janna, PA-C  rosuvastatin  (CRESTOR ) 40 MG tablet TAKE 1 TABLET(40 MG) BY MOUTH  DAILY 04/18/24   Ostwalt, Janna, PA-C    Physical Exam: Vitals:   06/17/24 1348  BP: (!) 132/43  Pulse: 89  Resp: 17  Temp: 98.4 F (36.9 C)  TempSrc: Oral  SpO2: 100%  Weight: 68 kg  Height: 5' 1 (1.549 m)   Physical Exam Vitals and nursing note reviewed.  Constitutional:      General: She is awake. She is not  in acute distress.    Appearance: Normal appearance. She is ill-appearing.  HENT:     Head: Normocephalic.     Nose: No rhinorrhea.     Mouth/Throat:     Mouth: Mucous membranes are moist.  Eyes:     General: No scleral icterus.    Pupils: Pupils are equal, round, and reactive to light.  Neck:     Vascular: No JVD.  Cardiovascular:     Rate and Rhythm: Normal rate and regular rhythm.     Heart sounds: S1 normal and S2 normal.  Pulmonary:     Effort: Pulmonary effort is normal.     Breath sounds: Normal breath sounds. No wheezing, rhonchi or rales.  Abdominal:     General: Bowel sounds are normal. There is no distension.     Palpations: Abdomen is soft.     Tenderness: There is no abdominal tenderness. There is no right CVA tenderness or left CVA tenderness.  Musculoskeletal:     Cervical back: Neck supple.     Right lower leg: No edema.     Left lower leg: No edema.  Skin:    General: Skin is warm and dry.  Neurological:     General: No focal deficit present.     Mental Status: She is alert and oriented to person, place, and time.  Psychiatric:        Mood and Affect: Mood normal.        Behavior: Behavior normal. Behavior is cooperative.     Data Reviewed:  Results are pending, will review when available.  Assessment and Plan: Principal Problem:   Acute blood loss anemia (ABLA) In the setting of:   Acute upper GI bleed Admit to PCU/inpatient. Keep NPO after midnight. May have clear liquids until then. Normal saline bolus. Continue pantoprazole  40 mg every 12 hours. 2 units of PRBC. Monitor H&H. Transfuse further as needed. GI consult requested.  Active Problems:   AKI (acute kidney injury) (HCC) Continue IV fluids. Hold ARB/ACE. Hold diuretic. Avoid hypotension. Avoid nephrotoxins. Monitor intake and output. Monitor renal function/electrolytes.    Hypertension Hypotensive earlier. Lost 5 g of blood Hold antihypertensives for now.     Hypercholesteremia Continue rosuvastatin  40 mg p.o. daily.    Systolic murmur Patient, it already has been checked. She had an echocardiogram at another facility.    Borderline diabetes Follow fasting glucose.      Advance Care Planning:   Code Status: Full Code   Consults: Gastroenterology (Dr. Unk).  Family Communication:   Severity of Illness: The appropriate patient status for this patient is INPATIENT. Inpatient status is judged to be reasonable and necessary in order to provide the required intensity of service to ensure the patient's safety. The patient's presenting symptoms, physical exam findings, and initial radiographic and laboratory data in the context of their chronic comorbidities is felt to place them at high risk for further clinical deterioration. Furthermore, it is not anticipated that the patient will be medically stable for discharge from the hospital within 2 midnights of admission.   *  I certify that at the point of admission it is my clinical judgment that the patient will require inpatient hospital care spanning beyond 2 midnights from the point of admission due to high intensity of service, high risk for further deterioration and high frequency of surveillance required.*  Author: Alm Dorn Castor, MD 06/17/2024 3:25 PM  For on call review [email or web address].   This document was prepared using Dragon voice recognition software and may contain some unintended transcription errors.

## 2024-06-17 NOTE — ED Triage Notes (Signed)
 Pt states generalized weakness since yesterday and states N/V started today. Pt endorses dark stools over the past few days, pt denies blood thinner use. NAD noted.  Pt denies fevers.

## 2024-06-17 NOTE — Consult Note (Signed)
 Priscilla JONELLE Brooklyn, MD [address]  [place], [place] [address]  Main: [phone] Fax:  [phone] Pager: [phone]   Consultation  Referring Provider:     No ref. provider found Primary Care Physician:  Ostwalt, Janna, PA-C Primary Gastroenterologist: Sampson           Reason for Consultation:     Melena  Date of Admission:  06/17/2024 Date of Consultation:  06/17/2024         HPI:   Priscilla Meza is a 68 y.o. female with history of headache, GERD, hypertension presented with black tarry stools over the past 2 to 3 days associated with nausea and vomiting, generalized weakness.  She denies any coffee-ground emesis, rectal bleeding.  She has been taking naproxen and acetaminophen  combination 2 pills twice daily few days a week for last 4 weeks for her back pain.  She denies any abdominal pain.  She went to pick up her grandchildren from airport and was very tired.  She is hemodynamically stable, labs in the ER were significant for new onset of anemia, 7.2 significant drop from 12.6, 8 months ago.  Elevated BUN/creatinine 92/1.45, normal LFTs. Patient received IV fluids and IV Protonix  bolus followed by maintenance dose Cologuard came back positive on 10/23/2023, she did not undergo colonoscopy at Known history of upper GI bleed, likely erosive esophagitis based on upper endoscopy in 2016 She does not take any acid suppressive medication as outpatient Patient is currently receiving first unit of PRBCs  NSAIDs: Ibuprofen, naproxen  Antiplts/Anticoagulants/Anti thrombotics: None  GI Procedures: Upper endoscopy 12/02/2014  Past Medical History:  Diagnosis Date   Frequent headaches    GERD (gastroesophageal reflux disease)    Hypertension    Urine incontinence     Past Surgical History:  Procedure Laterality Date   ABDOMINAL HYSTERECTOMY  1985   TUBAL LIGATION       Current Facility-Administered Medications:    acetaminophen  (TYLENOL ) tablet 650 mg, 650 mg, Oral,  Q6H PRN **OR** acetaminophen  (TYLENOL ) suppository 650 mg, 650 mg, Rectal, Q6H PRN, Celinda Alm Lot, MD   Buprenorphine  HCl FILM 1 Film, 1 Film, Buccal, Q12H, Celinda Alm Lot, MD   HYDROcodone -acetaminophen  (NORCO/VICODIN) 5-325 MG per tablet 1 tablet, 1 tablet, Oral, Q6H PRN, Celinda Alm Lot, MD   ondansetron  (ZOFRAN ) tablet 4 mg, 4 mg, Oral, Q6H PRN **OR** ondansetron  (ZOFRAN ) injection 4 mg, 4 mg, Intravenous, Q6H PRN, Celinda Alm Lot, MD, 4 mg at 06/17/24 1557   pantoprazole  (PROTONIX ) injection 40 mg, 40 mg, Intravenous, Q12H, Celinda Alm Lot, MD   polyethylene glycol powder (GLYCOLAX /MIRALAX ) container 238 g, 238 g, Oral, Once, Marguerite Jarboe, Priscilla Skiff, MD   NOREEN ON 06/18/2024] rosuvastatin  (CRESTOR ) tablet 40 mg, 40 mg, Oral, Daily, Celinda Alm Lot, MD   sodium chloride  0.9 % bolus 500 mL, 500 mL, Intravenous, Once, Celinda Alm Lot, MD   Family History  Problem Relation Age of Onset   Breast cancer Maternal Aunt    Colon cancer Maternal Grandmother      Social History   Tobacco Use   Smoking status: Former   Smokeless tobacco: Never   Tobacco comments:    quit in 12/2013  Vaping Use   Vaping status: Every Day  Substance Use Topics   Alcohol use: No    Alcohol/week: 0.0 standard drinks of alcohol   Drug use: No    Allergies as of 06/17/2024   (No Known Allergies)    Review of Systems:    All systems  reviewed and negative except where noted in HPI.   Physical Exam:  Vital signs in last 24 hours: Temp:  [97.9 F (36.6 C)-98.4 F (36.9 C)] 97.9 F (36.6 C) (07/19 1752) Pulse Rate:  [89-94] 89 (07/19 1752) Resp:  [17-18] 18 (07/19 1752) BP: (93-132)/(39-48) 96/39 (07/19 1752) SpO2:  [100 %] 100 % (07/19 1752) Weight:  [68 kg] 68 kg (07/19 1348) Last BM Date : 06/17/24 General:   Alert,  Well-developed, well-nourished, pleasant and cooperative in NAD Eyes:  Sclera clear, no icterus.   Conjunctiva pink. Lungs:  Respirations even and  unlabored.  Clear throughout to auscultation.   No wheezes, crackles, or rhonchi. No acute distress. Heart:  Regular rate and rhythm; no murmurs, clicks, rubs, or gallops. Abdomen:  Normal bowel sounds. Soft, non-tender and non-distended without masses, hepatosplenomegaly or hernias noted.  No guarding or rebound tenderness.   Rectal: Not performed Extremities:  No clubbing or edema.  No cyanosis. Neurologic:  Alert and oriented x3 Skin:  Intact without significant lesions or rashes. No jaundice. Psych:  Alert and cooperative. Normal mood and affect.  LAB RESULTS:    Latest Ref Rng & Units 06/17/2024    2:08 PM 10/19/2023   10:03 AM 01/27/2021   10:33 AM  CBC  WBC 4.0 - 10.5 K/uL 18.6  9.4  8.8   Hemoglobin 12.0 - 15.0 g/dL 7.2  87.3  89.0   Hematocrit 36.0 - 46.0 % 21.1  38.6  32.0   Platelets 150 - 400 K/uL 220  260  285     BMET    Latest Ref Rng & Units 06/17/2024    2:08 PM 10/19/2023   10:03 AM 01/27/2021   10:33 AM  BMP  Glucose 70 - 99 mg/dL 839  88  86   BUN 8 - 23 mg/dL 92  18  15   Creatinine 0.44 - 1.00 mg/dL 8.54  9.08  8.86   BUN/Creat Ratio 12 - 28  20  13    Sodium 135 - 145 mmol/L 136  142  143   Potassium 3.5 - 5.1 mmol/L 4.4  4.1  3.5   Chloride 98 - 111 mmol/L 106  108  105   CO2 22 - 32 mmol/L 20  23  21    Calcium  8.9 - 10.3 mg/dL 9.7  9.3  9.3     LFT    Latest Ref Rng & Units 06/17/2024    2:08 PM 10/19/2023   10:03 AM 01/27/2021   10:33 AM  Hepatic Function  Total Protein 6.5 - 8.1 g/dL 6.7  7.0  7.1   Albumin 3.5 - 5.0 g/dL 3.8  4.0  4.2   AST 15 - 41 U/L 22  14  16    ALT 0 - 44 U/L 11  8  10    Alk Phosphatase 38 - 126 U/L 51  92  53   Total Bilirubin 0.0 - 1.2 mg/dL 0.7  0.3  0.4      STUDIES: No results found.    Impression / Plan:   Priscilla Meza is a 68 y.o. female with hypertension, chronic GERD, chronic headache presented with 2 to 3 days history of melena, severe symptomatic anemia, Cologuard positive in 10/2023  Melena, severe  symptomatic anemia in setting of chronic NSAID use Elevated BUN/creatinine suggestive of upper GI bleed or small bowel bleed or proximal colonic bleed Recommend blood transfusion at least 1 unit of PRBCs, monitor CBC closely to maintain hemoglobin above 7 Check  iron panel, B12 and folate levels Maintain 2 large-bore IVs Agree with Protonix  40 mg IV twice daily Patient also was Cologuard positive in 10/2023 Recommend upper endoscopy and colonoscopy, possible video capsule endoscopy Clear liquid diet N.p.o. effective IM tomorrow Bowel prep ordered  I have discussed alternative options, risks & benefits,  which include, but are not limited to, bleeding, infection, perforation,respiratory complication & drug reaction.  The patient agrees with this plan & written consent will be obtained.      Thank you for involving me in the care of this patient.      LOS: 0 days   Priscilla Brooklyn, MD  06/17/2024, 6:24 PM    Note: This dictation was prepared with Dragon dictation along with smaller phrase technology. Any transcriptional errors that result from this process are unintentional.

## 2024-06-17 NOTE — Plan of Care (Signed)
 1 of 2 units of blood started this shift, pt tolerating well. Pt continues to be hypotensive, MD notified. 1500 ml NS bolus given and continue blood. Pt remains A&O x 4 but reports weakness.     Problem: Education: Goal: Knowledge of General Education information will improve Description: Including pain rating scale, medication(s)/side effects and non-pharmacologic comfort measures Outcome: Progressing   Problem: Health Behavior/Discharge Planning: Goal: Ability to manage health-related needs will improve Outcome: Progressing   Problem: Clinical Measurements: Goal: Ability to maintain clinical measurements within normal limits will improve Outcome: Progressing Goal: Will remain free from infection Outcome: Progressing Goal: Diagnostic test results will improve Outcome: Progressing Goal: Respiratory complications will improve Outcome: Progressing Goal: Cardiovascular complication will be avoided Outcome: Progressing   Problem: Activity: Goal: Risk for activity intolerance will decrease Outcome: Progressing   Problem: Nutrition: Goal: Adequate nutrition will be maintained Outcome: Progressing   Problem: Coping: Goal: Level of anxiety will decrease Outcome: Progressing   Problem: Elimination: Goal: Will not experience complications related to bowel motility Outcome: Progressing Goal: Will not experience complications related to urinary retention Outcome: Progressing   Problem: Pain Managment: Goal: General experience of comfort will improve and/or be controlled Outcome: Progressing   Problem: Safety: Goal: Ability to remain free from injury will improve Outcome: Progressing   Problem: Skin Integrity: Goal: Risk for impaired skin integrity will decrease Outcome: Progressing

## 2024-06-17 NOTE — Progress Notes (Signed)
 Pt to start bowel prep tonight, 4 bottles of clear gatorade ordered from dietary. Pt continues to be hypotensive.

## 2024-06-18 ENCOUNTER — Inpatient Hospital Stay: Admitting: Anesthesiology

## 2024-06-18 ENCOUNTER — Encounter
Admission: EM | Disposition: A | Discharge status: Home / Self Care | Payer: Self-pay | Source: Home / Self Care | Attending: Obstetrics and Gynecology

## 2024-06-18 ENCOUNTER — Encounter: Payer: Self-pay | Admitting: Internal Medicine

## 2024-06-18 DIAGNOSIS — K922 Gastrointestinal hemorrhage, unspecified: Secondary | ICD-10-CM | POA: Diagnosis not present

## 2024-06-18 HISTORY — PX: COLONOSCOPY: SHX5424

## 2024-06-18 HISTORY — PX: GIVENS CAPSULE STUDY: SHX5432

## 2024-06-18 HISTORY — PX: ESOPHAGOGASTRODUODENOSCOPY: SHX5428

## 2024-06-18 LAB — TYPE AND SCREEN
ABO/RH(D): O NEG
Antibody Screen: NEGATIVE
Unit division: 0
Unit division: 0

## 2024-06-18 LAB — BPAM RBC
Blood Product Expiration Date: 202508192359
Blood Product Expiration Date: 202508192359
ISSUE DATE / TIME: 202507191719
ISSUE DATE / TIME: 202507192120
Unit Type and Rh: 5100
Unit Type and Rh: 5100

## 2024-06-18 LAB — COMPREHENSIVE METABOLIC PANEL WITH GFR
ALT: 9 U/L (ref 0–44)
AST: 19 U/L (ref 15–41)
Albumin: 2.7 g/dL — ABNORMAL LOW (ref 3.5–5.0)
Alkaline Phosphatase: 35 U/L — ABNORMAL LOW (ref 38–126)
Anion gap: 4 — ABNORMAL LOW (ref 5–15)
BUN: 66 mg/dL — ABNORMAL HIGH (ref 8–23)
CO2: 21 mmol/L — ABNORMAL LOW (ref 22–32)
Calcium: 8.5 mg/dL — ABNORMAL LOW (ref 8.9–10.3)
Chloride: 114 mmol/L — ABNORMAL HIGH (ref 98–111)
Creatinine, Ser: 1.01 mg/dL — ABNORMAL HIGH (ref 0.44–1.00)
GFR, Estimated: >60 mL/min (ref >60)
Glucose, Bld: 100 mg/dL — ABNORMAL HIGH (ref 70–99)
Potassium: 4.1 mmol/L (ref 3.5–5.1)
Sodium: 139 mmol/L (ref 135–145)
Total Bilirubin: 0.9 mg/dL (ref 0.0–1.2)
Total Protein: 5 g/dL — ABNORMAL LOW (ref 6.5–8.1)

## 2024-06-18 LAB — FIBRINOGEN: Fibrinogen: 306 mg/dL (ref 210–475)

## 2024-06-18 LAB — CBC
HCT: 23.8 % — ABNORMAL LOW (ref 36.0–46.0)
HCT: 25.5 % — ABNORMAL LOW (ref 36.0–46.0)
Hemoglobin: 8.2 g/dL — ABNORMAL LOW (ref 12.0–15.0)
Hemoglobin: 8.7 g/dL — ABNORMAL LOW (ref 12.0–15.0)
MCH: 29.8 pg (ref 26.0–34.0)
MCH: 29.9 pg (ref 26.0–34.0)
MCHC: 34.5 g/dL (ref 30.0–36.0)
MCHC: 34.1 g/dL (ref 30.0–36.0)
MCV: 86.5 fL (ref 80.0–100.0)
MCV: 87.6 fL (ref 80.0–100.0)
Platelets: 127 K/uL — ABNORMAL LOW (ref 150–400)
Platelets: 145 K/uL — ABNORMAL LOW (ref 150–400)
RBC: 2.75 MIL/uL — ABNORMAL LOW (ref 3.87–5.11)
RBC: 2.91 MIL/uL — ABNORMAL LOW (ref 3.87–5.11)
RDW: 13.3 % (ref 11.5–15.5)
RDW: 13.7 % (ref 11.5–15.5)
WBC: 11 K/uL — ABNORMAL HIGH (ref 4.0–10.5)
WBC: 10.5 K/uL (ref 4.0–10.5)
nRBC: 0 % (ref 0.0–0.2)
nRBC: 0 % (ref 0.0–0.2)

## 2024-06-18 LAB — PROTIME-INR
INR: 1.1 (ref 0.8–1.2)
Prothrombin Time: 14.5 s (ref 11.4–15.2)

## 2024-06-18 LAB — HIV ANTIBODY (ROUTINE TESTING W REFLEX): HIV Screen 4th Generation wRfx: NONREACTIVE

## 2024-06-18 LAB — VITAMIN B12: Vitamin B-12: 1432 pg/mL — ABNORMAL HIGH (ref 180–914)

## 2024-06-18 SURGERY — EGD (ESOPHAGOGASTRODUODENOSCOPY)
Anesthesia: General

## 2024-06-18 MED ORDER — SODIUM CHLORIDE 0.9 % IV SOLN: INTRAVENOUS | Status: DC

## 2024-06-18 MED ORDER — MIDAZOLAM HCL 2 MG/2ML IJ SOLN
INTRAMUSCULAR | Status: AC
  Filled 2024-06-18: qty 2

## 2024-06-18 MED ORDER — GLYCOPYRROLATE 0.2 MG/ML IJ SOLN
INTRAMUSCULAR | Status: DC | PRN
  Administered 2024-06-18: .2 mg via INTRAVENOUS

## 2024-06-18 MED ORDER — POLYETHYLENE GLYCOL 3350 17 GM/SCOOP PO POWD
238.0000 g | Freq: Once | ORAL | Status: AC
  Administered 2024-06-18: 238 g via ORAL
  Filled 2024-06-18: qty 238

## 2024-06-18 MED ORDER — PROPOFOL 10 MG/ML IV BOLUS
INTRAVENOUS | Status: DC | PRN
  Administered 2024-06-18: 70 mg via INTRAVENOUS

## 2024-06-18 MED ORDER — EPHEDRINE SULFATE (PRESSORS) 50 MG/ML IJ SOLN
INTRAMUSCULAR | Status: DC | PRN
  Administered 2024-06-18: 15 mg via INTRAVENOUS

## 2024-06-18 MED ORDER — MIDAZOLAM HCL 5 MG/5ML IJ SOLN
INTRAMUSCULAR | Status: DC | PRN
  Administered 2024-06-18: 2 mg via INTRAVENOUS

## 2024-06-18 MED ORDER — PANTOPRAZOLE SODIUM 40 MG PO TBEC
40.0000 mg | DELAYED_RELEASE_TABLET | Freq: Two times a day (BID) | ORAL | Status: DC
  Administered 2024-06-18 – 2024-06-19 (×2): 40 mg via ORAL
  Filled 2024-06-18 (×2): qty 1

## 2024-06-18 MED ORDER — PHENYLEPHRINE HCL (PRESSORS) 10 MG/ML IV SOLN
INTRAVENOUS | Status: DC | PRN
  Administered 2024-06-18 (×2): 160 ug via INTRAVENOUS

## 2024-06-18 MED ORDER — PROPOFOL 500 MG/50ML IV EMUL
INTRAVENOUS | Status: DC | PRN
  Administered 2024-06-18: 120 ug/kg/min via INTRAVENOUS
  Administered 2024-06-18: 100 ug/kg/min via INTRAVENOUS

## 2024-06-18 MED ORDER — BUPRENORPHINE HCL 2 MG SL SUBL
2.0000 mg | SUBLINGUAL_TABLET | Freq: Two times a day (BID) | SUBLINGUAL | Status: DC
  Administered 2024-06-18 – 2024-06-19 (×3): 2 mg via SUBLINGUAL
  Filled 2024-06-18 (×3): qty 1

## 2024-06-18 MED ORDER — ORAL CARE MOUTH RINSE: 15.0000 mL | OROMUCOSAL | Status: DC | PRN

## 2024-06-18 NOTE — Plan of Care (Signed)

## 2024-06-18 NOTE — Op Note (Signed)
 [facility] Gastroenterology Patient Name: Priscilla Meza Procedure Date: 06/18/2024 9:07 AM MRN: [id number] Account #: [id number] Date of Birth: [birth year] Admit Type: Inpatient Age: 68 Room: [facility] ENDO ROOM 4 Gender: Female Note Status: Finalized Instrument Name: Upper Endoscope [id number] Procedure:             Upper GI endoscopy Indications:           Iron deficiency anemia secondary to chronic blood                         loss, Melena Providers:             Corinn Jess Brooklyn MD, MD Medicines:             General Anesthesia Complications:         No immediate complications. Estimated blood loss: None. Procedure:             Pre-Anesthesia Assessment:                        - Prior to the procedure, a History and Physical was                         performed, and patient medications and allergies were                         reviewed. The patient is competent. The risks and                         benefits of the procedure and the sedation options and                         risks were discussed with the patient. All questions                         were answered and informed consent was obtained.                         Patient identification and proposed procedure were                         verified by the physician, the nurse, the                         anesthesiologist, the anesthetist and the technician                         in the pre-procedure area in the procedure room in the                         endoscopy suite. Mental Status Examination: alert and                         oriented. Airway Examination: normal oropharyngeal                         airway and neck mobility. Respiratory Examination:                         clear to  auscultation. CV Examination: normal.                         Prophylactic Antibiotics: The patient does not require                         prophylactic antibiotics. Prior Anticoagulants: The                          patient has taken no anticoagulant or antiplatelet                         agents. ASA Grade Assessment: II - A patient with mild                         systemic disease. After reviewing the risks and                         benefits, the patient was deemed in satisfactory                         condition to undergo the procedure. The anesthesia                         plan was to use general anesthesia. Immediately prior                         to administration of medications, the patient was                         re-assessed for adequacy to receive sedatives. The                         heart rate, respiratory rate, oxygen saturations,                         blood pressure, adequacy of pulmonary ventilation, and                         response to care were monitored throughout the                         procedure. The physical status of the patient was                         re-assessed after the procedure.                        After obtaining informed consent, the endoscope was                         passed under direct vision. Throughout the procedure,                         the patient's blood pressure, pulse, and oxygen                         saturations were monitored continuously. The Endoscope  was introduced through the mouth, and advanced to the                         second part of duodenum. The upper GI endoscopy was                         accomplished without difficulty. The patient tolerated                         the procedure well. Findings:      Diffuse severe inflammation characterized by congestion (edema),       erosions and friability was found in the duodenal bulb.      The second portion of the duodenum was normal.      A medium-sized hiatal hernia with multiple Cameron ulcers was found.      Multiple dispersed large erosions with no bleeding and no stigmata of       recent bleeding were found in the gastric antrum. Biopsies  were taken       with a cold forceps for Helicobacter pylori testing.      The gastric body was normal. Biopsies were taken with a cold forceps for       Helicobacter pylori testing.      The exam was otherwise without abnormality.      The gastroesophageal junction and examined esophagus were normal. Impression:            - Duodenitis.                        - Normal second portion of the duodenum.                        - Medium-sized hiatal hernia with multiple Cameron                         ulcers.                        - Erosive gastropathy with no bleeding and no stigmata                         of recent bleeding. Biopsied.                        - Normal gastric body. Biopsied.                        - The examination was otherwise normal.                        - Normal gastroesophageal junction and esophagus. Recommendation:        - Await pathology results.                        - Follow an antireflux regimen indefinitely.                        - Use Prilosec (omeprazole) 40 mg PO BID indefinitely.                        - Return to my office  in 3 months.                        - Proceed with colonoscopy as scheduled                        See colonoscopy report Procedure Code(s):     --- Professional ---                        [id number], Esophagogastroduodenoscopy, flexible,                         transoral; with biopsy, single or multiple Diagnosis Code(s):     --- Professional ---                        K29.80, Duodenitis without bleeding                        K44.9, Diaphragmatic hernia without obstruction or                         gangrene                        K25.9, Gastric ulcer, unspecified as acute or chronic,                         without hemorrhage or perforation                        K31.89, Other diseases of stomach and duodenum                        D50.0, Iron deficiency anemia secondary to blood loss                         (chronic)                         K92.1, Melena (includes Hematochezia) CPT copyright 2022 American Medical Association. All rights reserved. The codes documented in this report are preliminary and upon coder review may  be revised to meet current compliance requirements. Dr. Corinn Brooklyn Corinn Jess Brooklyn MD, MD 06/18/2024 9:26:35 AM This report has been signed electronically. Number of Addenda: 0 Note Initiated On: 06/18/2024 9:07 AM Estimated Blood Loss:  Estimated blood loss: none.      [facility]

## 2024-06-18 NOTE — Progress Notes (Signed)
 PROGRESS NOTE    Priscilla Meza  FMW:[id number] DOB: 03-13-1956 DOA: 06/17/2024 PCP: Ostwalt, Janna, PA-C  Outpatient Specialists: pain medicine    Brief Narrative:   From admission h and p  Priscilla Meza is a 68 y.o. female with medical history significant of glucose intolerance, hyperlipidemia, hypertension, history of GI bleed, arteriosclerotic cardiovascular disease, chronic back pain, lumbar radiculopathy who presented to the emergency department with generalized weakness, postural dizziness after having multiple episodes of melena over the past 2-3 days.  She also had nausea and vomiting earlier today.  No coffee grounds or hematemesis.    No abdominal pain, diarrhea, constipation or hematochezia. She recently used 2 tablets of naproxen last weekend.  She denied fever, chills, rhinorrhea, sore throat, wheezing or hemoptysis.  No chest pain, palpitations, diaphoresis, PND, orthopnea or pitting edema of the lower extremities. No flank pain, dysuria, frequency or hematuria.  No polyuria, polydipsia, polyphagia or blurred vision.   Assessment & Plan:   Principal Problem:   Acute upper GI bleed Active Problems:   Hypertension   Hypercholesteremia   Systolic murmur   Chronic pain syndrome   Arteriosclerotic cardiovascular disease (ASCVD)   Borderline diabetes   AKI (acute kidney injury) (HCC)   Acute blood loss anemia (ABLA)  # GI bleed Likely upper gi bleed given elevated bun:cr, melena. EGD today showed duodenitis, hiatal hernia w/ cameron ulcers, erosive gastropathy, but no active bleeding. Poor prep so colonoscopy not performed. Last melena this morning. Hemodynamically stable. Had GI bleed in 2016, EGD then showed angiodysplastic lesion in stomach. Has been off PPI for several years and taking nsaids several times a week. No pain to suggest ischemic etiology. Did have a recent cologaurd positive. - prepping today for colonoscopy tomorrow - bid PPI at discharge indefinitely -  repeat EGD 3 mo  # Acute blood loss anemia Last hgb 12s, symptomatic and 7.2 on arrival, transfused 2 units and 8.2 today. Melena this morning. Hemodynamically stable and symptomatically improved. - repeat cbc with INR and fibrinogen  this afternoon, transfuse as needed  # AKI Mild, likely pre-renal, resolved w/ blood and fluids - trend  # Chronic pain - home hydrocodone  - pharmacy to determine appropriate formulary substitution for patient's home buprenorphine   # HTN Bp appropriate - home hydrochlorothiazide , lisinopril  on hold   DVT prophylaxis: SCDs Code Status: full Family Communication: none at bedside  Level of care: Progressive Status is: Inpatient Remains inpatient appropriate because: need for inpatient eval    Consultants:  GI  Procedures: EGD  Antimicrobials:  none    Subjective: Reports feeling fine. Stable chronic pain, no abd or new pain. No lightheadedness  Objective: Vitals:   06/18/24 1010 06/18/24 1015 06/18/24 1024 06/18/24 1223  BP: (!) 109/59  (!) 126/59 (!) 120/46  Pulse: (!) 108 99  78  Resp: 16 13  15   Temp:    98.2 F (36.8 C)  TempSrc:    Oral  SpO2: 98% 98%  100%  Weight:      Height:        Intake/Output Summary (Last 24 hours) at 06/18/2024 1249 Last data filed at 06/18/2024 9062 Gross per 24 hour  Intake 2924.12 ml  Output 5200 ml  Net -2275.88 ml   Filed Weights   06/17/24 1348  Weight: 68 kg    Examination:  General exam: Appears calm and comfortable  Respiratory system: Clear to auscultation. Respiratory effort normal. Cardiovascular system: S1 & S2 heard, RRR. No JVD, murmurs, rubs, gallops or  clicks. No pedal edema. Gastrointestinal system: Abdomen is nondistended, soft and nontender. No organomegaly or masses felt. Normal bowel sounds heard. Central nervous system: Alert and oriented. No focal neurological deficits. Extremities: Symmetric 5 x 5 power. Skin: No rashes, lesions or ulcers Psychiatry: Judgement  and insight appear normal. Mood & affect appropriate.     Data Reviewed: I have personally reviewed following labs and imaging studies  CBC: Recent Labs  Lab 06/17/24 1408 06/18/24 0201  WBC 18.6* 11.0*  HGB 7.2* 8.2*  HCT 21.1* 23.8*  MCV 88.3 86.5  PLT 220 127*   Basic Metabolic Panel: Recent Labs  Lab 06/17/24 1408 06/18/24 0201  NA 136 139  K 4.4 4.1  CL 106 114*  CO2 20* 21*  GLUCOSE 160* 100*  BUN 92* 66*  CREATININE 1.45* 1.01*  CALCIUM  9.7 8.5*   GFR: Estimated Creatinine Clearance: 49 mL/min (A) (by C-G formula based on SCr of 1.01 mg/dL (H)). Liver Function Tests: Recent Labs  Lab 06/17/24 1408 06/18/24 0201  AST 22 19  ALT 11 9  ALKPHOS 51 35*  BILITOT 0.7 0.9  PROT 6.7 5.0*  ALBUMIN 3.8 2.7*   No results for input(s): LIPASE, AMYLASE in the last 168 hours. No results for input(s): AMMONIA in the last 168 hours. Coagulation Profile: No results for input(s): INR, PROTIME in the last 168 hours. Cardiac Enzymes: No results for input(s): CKTOTAL, CKMB, CKMBINDEX, TROPONINI in the last 168 hours. BNP (last 3 results) No results for input(s): PROBNP in the last 8760 hours. HbA1C: No results for input(s): HGBA1C in the last 72 hours. CBG: Recent Labs  Lab 06/17/24 1409  GLUCAP 145*   Lipid Profile: No results for input(s): CHOL, HDL, LDLCALC, TRIG, CHOLHDL, LDLDIRECT in the last 72 hours. Thyroid Function Tests: No results for input(s): TSH, T4TOTAL, FREET4, T3FREE, THYROIDAB in the last 72 hours. Anemia Panel: Recent Labs    06/17/24 1408 06/18/24 0201  VITAMINB12  --  1,432*  FOLATE 36.0  --   FERRITIN 49  --   TIBC 340  --   IRON 115  --    Urine analysis:    Component Value Date/Time   COLORURINE YELLOW (A) 06/17/2024 1452   APPEARANCEUR HAZY (A) 06/17/2024 1452   LABSPEC 1.015 06/17/2024 1452   PHURINE 5.0 06/17/2024 1452   GLUCOSEU NEGATIVE 06/17/2024 1452   HGBUR NEGATIVE  06/17/2024 1452   BILIRUBINUR NEGATIVE 06/17/2024 1452   KETONESUR NEGATIVE 06/17/2024 1452   PROTEINUR NEGATIVE 06/17/2024 1452   NITRITE NEGATIVE 06/17/2024 1452   LEUKOCYTESUR SMALL (A) 06/17/2024 1452   Sepsis Labs: @LABRCNTIP (procalcitonin:4,lacticidven:4)  )No results found for this or any previous visit (from the past 240 hours).       Radiology Studies: No results found.      Scheduled Meds:  Buprenorphine  HCl  1 Film Buccal Q12H   pantoprazole   40 mg Oral BID AC   rosuvastatin   40 mg Oral Daily   Continuous Infusions:   LOS: 1 day     Devaughn KATHEE Ban, MD [facility]   If 7PM-7AM, please contact night-coverage [email or web address] Password [facility] 06/18/2024, 12:49 PM

## 2024-06-18 NOTE — Progress Notes (Signed)
 EGD and colonoscopy postprocedure note  EGD revealed several gastric erosions, hiatal hernia with Ole erosions Duodenal erosions in the bulb The biopsies performed  Colonoscopy was not performed, rectal exam revealed solid black stool in the rectum along with black stool  Recommendations Discussed in length regarding repeat bowel prep and colonoscopy while inpatient Patient admitted to not finishing the prep as she felt nauseous She is also upset that she is not able to go home today and see her grandchildren at [facility] who are visiting from [place]. I tried to explain to her that she had a 4 g drop in hemoglobin and EGD findings are not convincing to me to explain the cause.  Therefore, recommend colonoscopy tomorrow and after that she could go home She agreed to undergo colonoscopy tomorrow Continue clear liquids Repeat bowel prep today  Corinn Brooklyn, MD

## 2024-06-18 NOTE — Anesthesia Postprocedure Evaluation (Signed)
 Anesthesia Post Note  Patient: Priscilla Meza  Procedure(s) Performed: EGD (ESOPHAGOGASTRODUODENOSCOPY) COLONOSCOPY IMAGING PROCEDURE, GI TRACT, INTRALUMINAL, VIA CAPSULE  Patient location during evaluation: PACU Anesthesia Type: General Level of consciousness: awake and alert Pain management: pain level controlled Vital Signs Assessment: post-procedure vital signs reviewed and stable Respiratory status: spontaneous breathing, nonlabored ventilation, respiratory function stable and patient connected to nasal cannula oxygen Cardiovascular status: blood pressure returned to baseline and stable Postop Assessment: no apparent nausea or vomiting Anesthetic complications: no   No notable events documented.   Last Vitals:  Vitals:   06/18/24 1549 06/18/24 1947  BP: (!) 128/47 (!) 139/50  Pulse: 83 74  Resp: 18 18  Temp: 36.6 C (!) 36.1 C  SpO2: 93% 93%    Last Pain:  Vitals:   06/18/24 1947  TempSrc:   PainSc: 0-No pain                 Prentice Murphy

## 2024-06-18 NOTE — Anesthesia Preprocedure Evaluation (Signed)
 Anesthesia Evaluation  Patient identified by MRN, date of birth, ID band Patient awake    Reviewed: Allergy & Precautions, H&P , NPO status , Patient's Chart, lab work & pertinent test results, reviewed documented beta blocker date and time   History of Anesthesia Complications Negative for: history of anesthetic complications  Airway Mallampati: III  TM Distance: >3 FB Neck ROM: full    Dental  (+) Dental Advidsory Given, Poor Dentition, Missing, Loose   Pulmonary shortness of breath (2/2 anemia, not usual), neg sleep apnea, neg COPD, neg recent URI, former smoker   Pulmonary exam normal breath sounds clear to auscultation       Cardiovascular Exercise Tolerance: Good hypertension, (-) angina (-) Past MI and (-) Cardiac Stents Normal cardiovascular exam(-) dysrhythmias + Valvular Problems/Murmurs  Rhythm:regular Rate:Normal     Neuro/Psych negative neurological ROS  negative psych ROS   GI/Hepatic Neg liver ROS,GERD  Medicated and Controlled,,  Endo/Other  negative endocrine ROS    Renal/GU negative Renal ROS  negative genitourinary   Musculoskeletal   Abdominal   Peds  Hematology  (+) Blood dyscrasia, anemia   Anesthesia Other Findings Past Medical History: No date: Frequent headaches No date: GERD (gastroesophageal reflux disease) No date: Hypertension No date: Urine incontinence   Reproductive/Obstetrics negative OB ROS                              Anesthesia Physical Anesthesia Plan  ASA: 2  Anesthesia Plan: General   Post-op Pain Management:    Induction: Intravenous  PONV Risk Score and Plan: 3 and Propofol  infusion, TIVA and Treatment may vary due to age or medical condition  Airway Management Planned: Natural Airway and Nasal Cannula  Additional Equipment:   Intra-op Plan:   Post-operative Plan:   Informed Consent: I have reviewed the patients History and  Physical, chart, labs and discussed the procedure including the risks, benefits and alternatives for the proposed anesthesia with the patient or authorized representative who has indicated his/her understanding and acceptance.     Dental Advisory Given  Plan Discussed with: Anesthesiologist, CRNA and Surgeon  Anesthesia Plan Comments:         Anesthesia Quick Evaluation

## 2024-06-18 NOTE — Transfer of Care (Signed)
 Immediate Anesthesia Transfer of Care Note  Patient: Priscilla Meza  Procedure(s) Performed: EGD (ESOPHAGOGASTRODUODENOSCOPY) COLONOSCOPY IMAGING PROCEDURE, GI TRACT, INTRALUMINAL, VIA CAPSULE  Patient Location: PACU  Anesthesia Type:General  Level of Consciousness: awake, alert , and oriented  Airway & Oxygen Therapy: Patient Spontanous Breathing  Post-op Assessment: Report given to RN and Post -op Vital signs reviewed and stable  Post vital signs: Reviewed  Last Vitals:  Vitals Value Taken Time  BP 90/49 06/18/24 09:45  Temp    Pulse 112 06/18/24 09:46  Resp 9 06/18/24 09:46  SpO2 95 % 06/18/24 09:46  Vitals shown include unfiled device data.  Last Pain:  Vitals:   06/18/24 0836  TempSrc: Oral  PainSc:       Patients Stated Pain Goal: 0 (06/18/24 9394)  Complications: No notable events documented.

## 2024-06-19 ENCOUNTER — Inpatient Hospital Stay: Admitting: Certified Registered"

## 2024-06-19 ENCOUNTER — Encounter
Admission: EM | Disposition: A | Discharge status: Home / Self Care | Payer: Self-pay | Source: Home / Self Care | Attending: Obstetrics and Gynecology

## 2024-06-19 DIAGNOSIS — K640 First degree hemorrhoids: Secondary | ICD-10-CM | POA: Diagnosis not present

## 2024-06-19 DIAGNOSIS — K621 Rectal polyp: Secondary | ICD-10-CM | POA: Diagnosis not present

## 2024-06-19 DIAGNOSIS — K921 Melena: Secondary | ICD-10-CM

## 2024-06-19 DIAGNOSIS — K635 Polyp of colon: Secondary | ICD-10-CM

## 2024-06-19 DIAGNOSIS — K922 Gastrointestinal hemorrhage, unspecified: Secondary | ICD-10-CM | POA: Diagnosis not present

## 2024-06-19 HISTORY — PX: COLONOSCOPY: SHX5424

## 2024-06-19 HISTORY — PX: POLYPECTOMY: SHX149

## 2024-06-19 LAB — CBC
HCT: 25 % — ABNORMAL LOW (ref 36.0–46.0)
Hemoglobin: 8.5 g/dL — ABNORMAL LOW (ref 12.0–15.0)
MCH: 30 pg (ref 26.0–34.0)
MCHC: 34 g/dL (ref 30.0–36.0)
MCV: 88.3 fL (ref 80.0–100.0)
Platelets: 161 K/uL (ref 150–400)
RBC: 2.83 MIL/uL — ABNORMAL LOW (ref 3.87–5.11)
RDW: 13.7 % (ref 11.5–15.5)
WBC: 11.9 K/uL — ABNORMAL HIGH (ref 4.0–10.5)
nRBC: 0.2 % (ref 0.0–0.2)

## 2024-06-19 LAB — BASIC METABOLIC PANEL WITH GFR
Anion gap: 9 (ref 5–15)
BUN: 33 mg/dL — ABNORMAL HIGH (ref 8–23)
CO2: 22 mmol/L (ref 22–32)
Calcium: 8.7 mg/dL — ABNORMAL LOW (ref 8.9–10.3)
Chloride: 105 mmol/L (ref 98–111)
Creatinine, Ser: 0.79 mg/dL (ref 0.44–1.00)
GFR, Estimated: >60 mL/min (ref >60)
Glucose, Bld: 86 mg/dL (ref 70–99)
Potassium: 4.4 mmol/L (ref 3.5–5.1)
Sodium: 136 mmol/L (ref 135–145)

## 2024-06-19 SURGERY — COLONOSCOPY
Anesthesia: General

## 2024-06-19 MED ORDER — VASOPRESSIN 20 UNIT/ML IV SOLN
INTRAVENOUS | Status: DC | PRN
  Administered 2024-06-19: 2 [IU] via INTRAVENOUS
  Administered 2024-06-19 (×2): 1 [IU] via INTRAVENOUS

## 2024-06-19 MED ORDER — PHENYLEPHRINE HCL-NACL 20-0.9 MG/250ML-% IV SOLN
INTRAVENOUS | Status: DC | PRN
  Administered 2024-06-19: 80 ug via INTRAVENOUS
  Administered 2024-06-19 (×3): 160 ug via INTRAVENOUS

## 2024-06-19 MED ORDER — PROPOFOL 500 MG/50ML IV EMUL
INTRAVENOUS | Status: DC | PRN
  Administered 2024-06-19: 50 mg via INTRAVENOUS
  Administered 2024-06-19: 150 ug/kg/min via INTRAVENOUS

## 2024-06-19 MED ORDER — SODIUM CHLORIDE 0.9 % IV SOLN: INTRAVENOUS | Status: DC

## 2024-06-19 MED ORDER — LIDOCAINE HCL (CARDIAC) PF 100 MG/5ML IV SOSY
PREFILLED_SYRINGE | INTRAVENOUS | Status: DC | PRN
  Administered 2024-06-19: 100 mg via INTRAVENOUS

## 2024-06-19 MED ORDER — PANTOPRAZOLE SODIUM 40 MG PO TBEC: 40.0000 mg | DELAYED_RELEASE_TABLET | Freq: Two times a day (BID) | ORAL | 1 refills | Status: AC

## 2024-06-19 MED ORDER — FERROUS SULFATE 325 (65 FE) MG PO TBEC: 325.0000 mg | DELAYED_RELEASE_TABLET | ORAL | 1 refills | Status: DC

## 2024-06-19 MED ORDER — LACTATED RINGERS IV BOLUS
500.0000 mL | Freq: Once | INTRAVENOUS | Status: AC
  Administered 2024-06-19: 500 mL via INTRAVENOUS

## 2024-06-19 NOTE — Op Note (Signed)
 [facility] Gastroenterology Patient Name: Priscilla Meza Procedure Date: 06/19/2024 11:24 AM MRN: [id number] Account #: [id number] Date of Birth: Feb 14, 1956 Admit Type: Inpatient Age: 68 Room: [facility] ENDO ROOM 4 Gender: Female Note Status: Finalized Instrument Name: Arvis [id number] Procedure:             Colonoscopy Indications:           Hematochezia Providers:             Rogelia Copping MD, MD Referring MD:          Jolynn Spencer (Referring MD) Medicines:             Propofol  per Anesthesia Complications:         No immediate complications. Procedure:             Pre-Anesthesia Assessment:                        - Prior to the procedure, a History and Physical was                         performed, and patient medications and allergies were                         reviewed. The patient's tolerance of previous                         anesthesia was also reviewed. The risks and benefits                         of the procedure and the sedation options and risks                         were discussed with the patient. All questions were                         answered, and informed consent was obtained. Prior                         Anticoagulants: The patient has taken no anticoagulant                         or antiplatelet agents. ASA Grade Assessment: II - A                         patient with mild systemic disease. After reviewing                         the risks and benefits, the patient was deemed in                         satisfactory condition to undergo the procedure.                        After obtaining informed consent, the colonoscope was                         passed under direct vision. Throughout the procedure,  the patient's blood pressure, pulse, and oxygen                         saturations were monitored continuously. The                         Colonoscope was introduced through the anus and                          advanced to the the cecum, identified by appendiceal                         orifice and ileocecal valve. The colonoscopy was                         performed without difficulty. The patient tolerated                         the procedure well. The quality of the bowel                         preparation was good. Findings:      The perianal and digital rectal examinations were normal.      A 3 mm polyp was found in the rectum. The polyp was sessile. The polyp       was removed with a cold snare. Resection and retrieval were complete.      Non-bleeding internal hemorrhoids were found during retroflexion. The       hemorrhoids were Grade I (internal hemorrhoids that do not prolapse). Impression:            - One 3 mm polyp in the rectum, removed with a cold                         snare. Resected and retrieved.                        - Non-bleeding internal hemorrhoids. Recommendation:        - Return patient to hospital ward for ongoing care.                        - Resume previous diet.                        - Continue present medications.                        - Await pathology results.                        - If the pathology report reveals adenomatous tissue,                         then repeat the colonoscopy for surveillance in 7                         years. Procedure Code(s):     --- Professional ---                        [phone], Colonoscopy, flexible;  with removal of                         tumor(s), polyp(s), or other lesion(s) by snare                         technique Diagnosis Code(s):     --- Professional ---                        K92.1, Melena (includes Hematochezia)                        D12.8, Benign neoplasm of rectum CPT copyright 2022 American Medical Association. All rights reserved. The codes documented in this report are preliminary and upon coder review may  be revised to meet current compliance requirements. Rogelia Copping MD, MD 06/19/2024 11:56:18  AM This report has been signed electronically. Number of Addenda: 0 Note Initiated On: 06/19/2024 11:24 AM Scope Withdrawal Time: 0 hours 6 minutes 55 seconds  Total Procedure Duration: 0 hours 15 minutes 16 seconds  Estimated Blood Loss:  Estimated blood loss: none.      [facility]

## 2024-06-19 NOTE — Anesthesia Postprocedure Evaluation (Signed)
 Anesthesia Post Note  Patient: Priscilla Meza  Procedure(s) Performed: COLONOSCOPY POLYPECTOMY, INTESTINE  Patient location during evaluation: Endoscopy Anesthesia Type: General Level of consciousness: awake and alert Pain management: pain level controlled Vital Signs Assessment: post-procedure vital signs reviewed and stable Respiratory status: spontaneous breathing, nonlabored ventilation and respiratory function stable Cardiovascular status: blood pressure returned to baseline and stable Postop Assessment: no apparent nausea or vomiting Anesthetic complications: no   There were no known notable events for this encounter.   Last Vitals:  Vitals:   06/19/24 1237 06/19/24 1245  BP: (!) 122/53 (!) 128/48  Pulse: 76 78  Resp: 11 (!) 8  Temp:    SpO2: 95% 95%    Last Pain:  Vitals:   06/19/24 1245  TempSrc:   PainSc: 0-No pain                 Camellia Merilee Louder

## 2024-06-19 NOTE — Plan of Care (Signed)

## 2024-06-19 NOTE — Discharge Summary (Signed)
 Priscilla Meza FMW:[id number] DOB: 04-Jan-1956 DOA: 06/17/2024  PCP: Ostwalt, Janna, PA-C  Admit date: 06/17/2024 Discharge date: 06/19/2024  Time spent: 35 minutes  Recommendations for Outpatient Follow-up:  Pcp f/u 1 week, check cbc then GI (Dr. Unk) f/u     Discharge Diagnoses:  Principal Problem:   Acute upper GI bleed Active Problems:   Hypertension   Hypercholesteremia   Systolic murmur   Chronic pain syndrome   Arteriosclerotic cardiovascular disease (ASCVD)   Borderline diabetes   AKI (acute kidney injury) (HCC)   Acute blood loss anemia (ABLA)   Melena   Polyp of colon   Discharge Condition: improved  Diet recommendation: heart healthy  Filed Weights   06/17/24 1348  Weight: 68 kg    History of present illness:  From admission h and p  Priscilla Meza is a 68 y.o. female with medical history significant of glucose intolerance, hyperlipidemia, hypertension, history of GI bleed, arteriosclerotic cardiovascular disease, chronic back pain, lumbar radiculopathy who presented to the emergency department with generalized weakness, postural dizziness after having multiple episodes of melena over the past 2-3 days.  She also had nausea and vomiting earlier today.  No coffee grounds or hematemesis.    No abdominal pain, diarrhea, constipation or hematochezia. She recently used 2 tablets of naproxen last weekend.  She denied fever, chills, rhinorrhea, sore throat, wheezing or hemoptysis.  No chest pain, palpitations, diaphoresis, PND, orthopnea or pitting edema of the lower extremities. No flank pain, dysuria, frequency or hematuria.  No polyuria, polydipsia, polyphagia or blurred vision.     Hospital Course:   Patient presents with 2 days melena, found to have new anemia hgb 7.2, and symptomatic. Transfused 2 units and hgb stable in the 8s. BUN:Cr > 20. This in the setting of regular nsaid use. EGD showed duodenitis, hiatal hernia w/ cameron ulcers, erosive gastropathy, but no  active bleeding. Poor prep so colonoscopy not performed that day, was performed the following day with non-bleeding internal hemorrhoids and one polyp (biopsied). Hgb stable and patient keen to discharge, reviewed with Dr. Jinny and he agrees with discharge. Plan will be indefinite PPI, will also start oral iron, will need close pcp f/u for monitoring of anemia. Will also need GI f/u, consider capsule endoscopy if there is concern for ongoing blood loss, will need repeat EGD in 3 months as well. Total abstinence from nsaids advised and strict return precautions were reviewed with patient and her sister. BPs soft here, advise holding antihypertensives until PCP f/u. Also had mild AKI that resolved with fluids.   Procedures: EGD, colonoscopy   Consultations: GI  Discharge Exam: Vitals:   06/19/24 1237 06/19/24 1245  BP: (!) 122/53 (!) 128/48  Pulse: 76 78  Resp: 11 (!) 8  Temp:    SpO2: 95% 95%    General: NAD Cardiovascular: RRR Respiratory: CTAB Abdomen: soft, non-tender  Discharge Instructions   Discharge Instructions     Diet - low sodium heart healthy   Complete by: As directed    Increase activity slowly   Complete by: As directed       Allergies as of 06/19/2024   No Known Allergies      Medication List     PAUSE taking these medications    hydrochlorothiazide  12.5 MG capsule Wait to take this until your doctor or other care provider tells you to start again. Commonly known as: MICROZIDE  TAKE 1 CAPSULE(12.5 MG) BY MOUTH DAILY   lisinopril  20 MG tablet Wait to  take this until your doctor or other care provider tells you to start again. Commonly known as: ZESTRIL  Take 2 tablets (40 mg total) by mouth daily.       TAKE these medications    Belbuca  75 MCG Film Generic drug: Buprenorphine  HCl Place 1 Film inside cheek every 12 (twelve) hours.   ferrous sulfate  325 (65 FE) MG EC tablet Take 1 tablet (325 mg total) by mouth every other day.    HYDROcodone -acetaminophen  5-325 MG tablet Commonly known as: NORCO/VICODIN Take 1 tablet by mouth every 6 (six) hours as needed for severe pain (pain score 7-10). Must last 30 days.   HYDROcodone -acetaminophen  5-325 MG tablet Commonly known as: NORCO/VICODIN Take 1 tablet by mouth every 6 (six) hours as needed for severe pain (pain score 7-10). Must last 30 days. Start taking on: July 03, 2024   HYDROcodone -acetaminophen  5-325 MG tablet Commonly known as: NORCO/VICODIN Take 1 tablet by mouth every 6 (six) hours as needed for severe pain (pain score 7-10). Must last 30 days. Start taking on: August 02, 2024   pantoprazole  40 MG tablet Commonly known as: PROTONIX  Take 1 tablet (40 mg total) by mouth 2 (two) times daily before a meal.   rosuvastatin  40 MG tablet Commonly known as: CRESTOR  TAKE 1 TABLET(40 MG) BY MOUTH DAILY       No Known Allergies  Follow-up Information     Ostwalt, Janna, PA-C Follow up.   Specialty: Physician Assistant Why: 1 week Contact information: [address] [place] [address] [phone]         Unk Corinn Skiff, MD Follow up.   Specialty: Gastroenterology Contact information: [address] [place] [address] [phone]         Unk Corinn Skiff, MD .   Specialty: Gastroenterology Contact information: [address] [place] [address] [phone]                  The results of significant diagnostics from this hospitalization (including imaging, microbiology, ancillary and laboratory) are listed below for reference.    Significant Diagnostic Studies: No results found.  Microbiology: No results found for this or any previous visit (from the past 240 hours).   Labs: Basic Metabolic Panel: Recent Labs  Lab 06/17/24 1408 06/18/24 0201 06/19/24 0441  NA 136 139 136  K 4.4 4.1 4.4  CL 106 114* 105  CO2 20* 21* 22  GLUCOSE 160* 100* 86  BUN 92* 66* 33*  CREATININE  1.45* 1.01* 0.79  CALCIUM  9.7 8.5* 8.7*   Liver Function Tests: Recent Labs  Lab 06/17/24 1408 06/18/24 0201  AST 22 19  ALT 11 9  ALKPHOS 51 35*  BILITOT 0.7 0.9  PROT 6.7 5.0*  ALBUMIN 3.8 2.7*   No results for input(s): LIPASE, AMYLASE in the last 168 hours. No results for input(s): AMMONIA in the last 168 hours. CBC: Recent Labs  Lab 06/17/24 1408 06/18/24 0201 06/18/24 1326 06/19/24 0441  WBC 18.6* 11.0* 10.5 11.9*  HGB 7.2* 8.2* 8.7* 8.5*  HCT 21.1* 23.8* 25.5* 25.0*  MCV 88.3 86.5 87.6 88.3  PLT 220 127* 145* 161   Cardiac Enzymes: No results for input(s): CKTOTAL, CKMB, CKMBINDEX, TROPONINI in the last 168 hours. BNP: BNP (last 3 results) No results for input(s): BNP in the last 8760 hours.  ProBNP (last 3 results) No results for input(s): PROBNP in the last 8760 hours.  CBG: Recent Labs  Lab 06/17/24 1409  GLUCAP 145*  Signed:  Devaughn KATHEE Ban MD.  [facility] 06/19/2024, 1:28 PM

## 2024-06-19 NOTE — Plan of Care (Signed)
 Patient requested to speak with leadership.  AD notified and will round when able.  Patient also stated concerns of continuous HA for past few days (said, I dont normally get headaches - could it be that Im dehydrated?)  Hosp informed and asked to assess/address as appropriate.  Pain meds and nausea meds given and confirmed appearance of recent BM in BSC (emptied). Stool had slight grey color, but clear - definitely appropriate to re-attempt her colonoscopy today :) New consent signed. Endo also called to request earliest time slot (if possible) - to hopefully assist in patient DC to begin vacation with her grandkids.

## 2024-06-19 NOTE — Transfer of Care (Signed)
 Immediate Anesthesia Transfer of Care Note  Patient: Priscilla Meza  Procedure(s) Performed: COLONOSCOPY POLYPECTOMY, INTESTINE  Patient Location: PACU  Anesthesia Type:General  Level of Consciousness: oriented, drowsy, and patient cooperative  Airway & Oxygen Therapy: Patient Spontanous Breathing and Patient connected to nasal cannula oxygen  Post-op Assessment: Report given to RN and Post -op Vital signs reviewed and stable  Post vital signs: stable lying flat with IV bolus going in. 2Lvia [place]  Last Vitals:  Vitals Value Taken Time  BP 105/45 06/19/24 12:02  Temp 36.2 C 06/19/24 11:57  Pulse 81 06/19/24 12:02  Resp 8 06/19/24 12:02  SpO2 94 % 06/19/24 12:02  Vitals shown include unfiled device data.  Last Pain:  Vitals:   06/19/24 1157  TempSrc: Tympanic  PainSc: Asleep      Patients Stated Pain Goal: 0 (06/19/24 0220)  Complications: No notable events documented.

## 2024-06-19 NOTE — Anesthesia Preprocedure Evaluation (Addendum)
 Anesthesia Evaluation  Patient identified by MRN, date of birth, ID band Patient awake    Reviewed: Allergy & Precautions, H&P , NPO status , Patient's Chart, lab work & pertinent test results, reviewed documented beta blocker date and time   History of Anesthesia Complications Negative for: history of anesthetic complications  Airway Mallampati: III  TM Distance: >3 FB Neck ROM: full    Dental  (+) Dental Advidsory Given, Poor Dentition, Missing, Loose   Pulmonary shortness of breath (2/2 anemia, not usual), neg sleep apnea, neg COPD, neg recent URI, former smoker   Pulmonary exam normal breath sounds clear to auscultation       Cardiovascular Exercise Tolerance: Good hypertension, (-) angina (-) Past MI and (-) Cardiac Stents (-) dysrhythmias + Valvular Problems/Murmurs  Rhythm:regular Rate:Normal + Systolic murmurs    Neuro/Psych negative neurological ROS  negative psych ROS   GI/Hepatic Neg liver ROS,GERD  Medicated and Controlled,,  Endo/Other  negative endocrine ROS    Renal/GU negative Renal ROS  negative genitourinary   Musculoskeletal   Abdominal   Peds  Hematology  (+) Blood dyscrasia, anemia   Anesthesia Other Findings Past Medical History: No date: Frequent headaches No date: GERD (gastroesophageal reflux disease) No date: Hypertension No date: Urine incontinence   Reproductive/Obstetrics negative OB ROS                              Anesthesia Physical Anesthesia Plan  ASA: 2  Anesthesia Plan: General   Post-op Pain Management: Minimal or no pain anticipated   Induction: Intravenous  PONV Risk Score and Plan: 3 and Propofol  infusion, TIVA and Treatment may vary due to age or medical condition  Airway Management Planned: Natural Airway and Nasal Cannula  Additional Equipment:   Intra-op Plan:   Post-operative Plan:   Informed Consent: I have reviewed the  patients History and Physical, chart, labs and discussed the procedure including the risks, benefits and alternatives for the proposed anesthesia with the patient or authorized representative who has indicated his/her understanding and acceptance.     Dental Advisory Given  Plan Discussed with: Anesthesiologist, CRNA and Surgeon  Anesthesia Plan Comments:          Anesthesia Quick Evaluation

## 2024-06-19 NOTE — Care Management Important Message (Signed)
 Important Message  Patient Details  Name: Priscilla Meza MRN: [id number] Date of Birth: [birth year]   Important Message Given:  Yes - Medicare IM     [name] 06/19/2024, 12:54 PM

## 2024-06-19 NOTE — Progress Notes (Signed)
 Transition of Care [facility]) - Inpatient Brief Assessment   Patient Details  Name: Priscilla Meza MRN: [id number] Date of Birth: 1956/03/06  Transition of Care [facility] CM/SW Contact:    Priscilla Meza C Priscilla Minkler, RN Phone Number: 06/19/2024, 2:44 PM   Clinical Narrative: TOC continuing to follow patient's progress throughout discharge planning.   Transition of Care Asessment: Insurance and Status: Insurance coverage has been reviewed Patient has primary care physician: Yes   Prior level of function:: independent Prior/Current Home Services: No current home services Social Drivers of Health Review: SDOH reviewed no interventions necessary Readmission risk has been reviewed: Yes Transition of care needs: no transition of care needs at this time

## 2024-06-20 ENCOUNTER — Ambulatory Visit: Payer: Self-pay | Admitting: Physician Assistant

## 2024-06-20 ENCOUNTER — Telehealth: Payer: Self-pay

## 2024-06-20 ENCOUNTER — Encounter: Payer: Self-pay | Admitting: Gastroenterology

## 2024-06-20 LAB — SURGICAL PATHOLOGY

## 2024-06-20 NOTE — Transitions of Care (Post Inpatient/ED Visit) (Signed)
 06/20/2024  Name: Priscilla Meza MRN: [id number] DOB: 1956/04/04  Today's TOC FU Call Status: Today's TOC FU Call Status:: Successful TOC FU Call Completed TOC FU Call Complete Date: 06/20/24 Patient's Name and Date of Birth confirmed.  Transition Care Management Follow-up Telephone Call Date of Discharge: 06/19/24 Discharge Facility: [facility]) Type of Discharge: Inpatient Admission Primary Inpatient Discharge Diagnosis:: GI bleed How have you been since you were released from the hospital?: Better Any questions or concerns?: No  Items Reviewed: Did you receive and understand the discharge instructions provided?: Yes Medications obtained,verified, and reconciled?: Yes (Medications Reviewed) Any new allergies since your discharge?: No Dietary orders reviewed?: Yes Do you have support at home?: Yes People in Home [RPT]: child(ren), adult  Medications Reviewed Today: Medications Reviewed Today     Reviewed by Emmitt Pan, LPN (Licensed Practical Nurse) on 06/20/24 at [phone]  Med List Status: <None>   Medication Order Taking? Sig Documenting Provider Last Dose Status Informant  Buprenorphine  HCl (BELBUCA ) 75 MCG FILM [id number] Yes Place 1 Film inside cheek every 12 (twelve) hours. Marcelino Nurse, MD  Active   ferrous sulfate  325 (65 FE) MG EC tablet [id number] Yes Take 1 tablet (325 mg total) by mouth every other day. Wouk, Devaughn Sayres, MD  Active   hydrochlorothiazide  (MICROZIDE ) 12.5 MG capsule [id number]  TAKE 1 CAPSULE(12.5 MG) BY MOUTH DAILY  Patient not taking: Reported on 06/20/2024   Ostwalt, Janna, PA-C  Active   HYDROcodone -acetaminophen  (NORCO/VICODIN) 5-325 MG tablet [id number] Yes Take 1 tablet by mouth every 6 (six) hours as needed for severe pain (pain score 7-10). Must last 30 days. Marcelino Nurse, MD  Active   HYDROcodone -acetaminophen  (NORCO/VICODIN) 5-325 MG tablet [id number] Yes Take 1 tablet by mouth every 6 (six) hours as needed for  severe pain (pain score 7-10). Must last 30 days. Marcelino Nurse, MD  Active   HYDROcodone -acetaminophen  (NORCO/VICODIN) 5-325 MG tablet [id number] Yes Take 1 tablet by mouth every 6 (six) hours as needed for severe pain (pain score 7-10). Must last 30 days. Marcelino Nurse, MD  Active   lisinopril  (ZESTRIL ) 20 MG tablet [id number]  Take 2 tablets (40 mg total) by mouth daily.  Patient not taking: Reported on 06/20/2024   Ostwalt, Janna, PA-C  Active   pantoprazole  (PROTONIX ) 40 MG tablet [id number] Yes Take 1 tablet (40 mg total) by mouth 2 (two) times daily before a meal. Wouk, Devaughn Sayres, MD  Active   rosuvastatin  (CRESTOR ) 40 MG tablet [id number] Yes TAKE 1 TABLET(40 MG) BY MOUTH DAILY Ostwalt, Janna, PA-C  Active   Med List Note Wendelyn Pulling, RN 05/30/24 0831): UDS 02/29/24  MR Hydro/APAP 09/01/24 06-28-2023 PA done for Belbuca  via CMM key [id number]  kt            Home Care and Equipment/Supplies: Were Home Health Services Ordered?: NA Any new equipment or medical supplies ordered?: NA  Functional Questionnaire: Do you need assistance with bathing/showering or dressing?: No Do you need assistance with meal preparation?: No Do you need assistance with eating?: No Do you have difficulty maintaining continence: No Do you need assistance with getting out of bed/getting out of a chair/moving?: No Do you have difficulty managing or taking your medications?: No  Follow up appointments reviewed: PCP Follow-up appointment confirmed?: No (no avail appts, sent message to staff) MD Provider Line Number:[phone] Given: No Specialist Hospital Follow-up appointment confirmed?: No Reason Specialist Follow-Up Not Confirmed: Patient has Specialist Provider Number and will Call for Appointment Do  you need transportation to your follow-up appointment?: No Do you understand care options if your condition(s) worsen?: Yes-patient verbalized understanding    SIGNATURE Julian Lemmings, LPN [facility]  Nurse Health Advisor Direct Dial [phone]

## 2024-06-21 NOTE — Progress Notes (Signed)
 Please inform patient that the pathology results from upper endoscopy came back normal.  Recommend to continue taking Protonix  40 mg twice daily before meals for 3 months, continue taking oral iron every other day, clinic follow-up in 3 to 4 months  RV

## 2024-06-22 ENCOUNTER — Telehealth: Payer: Self-pay

## 2024-06-22 ENCOUNTER — Other Ambulatory Visit: Payer: Self-pay | Admitting: Physician Assistant

## 2024-06-22 DIAGNOSIS — R7989 Other specified abnormal findings of blood chemistry: Secondary | ICD-10-CM

## 2024-06-22 DIAGNOSIS — R899 Unspecified abnormal finding in specimens from other organs, systems and tissues: Secondary | ICD-10-CM

## 2024-06-22 LAB — PREPARE RBC (CROSSMATCH)

## 2024-06-22 NOTE — Telephone Encounter (Signed)
 Patient advised.

## 2024-06-22 NOTE — Telephone Encounter (Signed)
 Copied from CRM [id number]. Topic: Clinical - Request for Lab/Test Order >> Jun 22, 2024  1:33 PM Winona SAUNDERS wrote: Pt scheduled a hospital follow up for Aug 4th and also need a CPC for oral surgery on the 6th of Aug. Can the [facility] order be placed in now so the pt can have her labs done prior to her appt on the 4th . Please contact the pt if there is anything else needed.

## 2024-06-26 ENCOUNTER — Encounter: Payer: Self-pay | Admitting: Physician Assistant

## 2024-06-26 DIAGNOSIS — R7989 Other specified abnormal findings of blood chemistry: Secondary | ICD-10-CM | POA: Diagnosis not present

## 2024-06-26 DIAGNOSIS — R899 Unspecified abnormal finding in specimens from other organs, systems and tissues: Secondary | ICD-10-CM | POA: Diagnosis not present

## 2024-06-27 ENCOUNTER — Ambulatory Visit: Payer: Self-pay | Admitting: Physician Assistant

## 2024-06-27 LAB — CBC WITH DIFFERENTIAL/PLATELET
Basophils Absolute: 0.1 x10E3/uL (ref 0.0–0.2)
Basos: 1 %
EOS (ABSOLUTE): 0.2 x10E3/uL (ref 0.0–0.4)
Eos: 2 %
Hematocrit: 26.9 % — ABNORMAL LOW (ref 34.0–46.6)
Hemoglobin: 8.6 g/dL — ABNORMAL LOW (ref 11.1–15.9)
Immature Grans (Abs): 0 x10E3/uL (ref 0.0–0.1)
Immature Granulocytes: 0 %
Lymphocytes Absolute: 2.9 x10E3/uL (ref 0.7–3.1)
Lymphs: 26 %
MCH: 30 pg (ref 26.6–33.0)
MCHC: 32 g/dL (ref 31.5–35.7)
MCV: 94 fL (ref 79–97)
Monocytes Absolute: 0.9 x10E3/uL (ref 0.1–0.9)
Monocytes: 8 %
Neutrophils Absolute: 7.1 x10E3/uL — ABNORMAL HIGH (ref 1.4–7.0)
Neutrophils: 63 %
Platelets: 316 x10E3/uL (ref 150–450)
RBC: 2.87 x10E6/uL — ABNORMAL LOW (ref 3.77–5.28)
RDW: 13.9 % (ref 11.7–15.4)
WBC: 11.2 x10E3/uL — ABNORMAL HIGH (ref 3.4–10.8)

## 2024-06-27 LAB — BASIC METABOLIC PANEL WITH GFR
BUN/Creatinine Ratio: 14 (ref 12–28)
BUN: 15 mg/dL (ref 8–27)
CO2: 23 mmol/L (ref 20–29)
Calcium: 9 mg/dL (ref 8.7–10.3)
Chloride: 104 mmol/L (ref 96–106)
Creatinine, Ser: 1.05 mg/dL — ABNORMAL HIGH (ref 0.57–1.00)
Glucose: 94 mg/dL (ref 70–99)
Potassium: 4.1 mmol/L (ref 3.5–5.2)
Sodium: 142 mmol/L (ref 134–144)
eGFR: 59 mL/min/1.73 — ABNORMAL LOW (ref >59)

## 2024-06-29 ENCOUNTER — Encounter: Payer: Self-pay | Admitting: Student in an Organized Health Care Education/Training Program

## 2024-06-29 DIAGNOSIS — G894 Chronic pain syndrome: Secondary | ICD-10-CM

## 2024-06-30 NOTE — Telephone Encounter (Signed)
 Pt was advised she would be on the cancellation list, per pt she will not be able to come in today as it is the busiest day for her. She is ok with waiting until Monday appointment.

## 2024-07-03 ENCOUNTER — Ambulatory Visit (INDEPENDENT_AMBULATORY_CARE_PROVIDER_SITE_OTHER): Admitting: Physician Assistant

## 2024-07-03 ENCOUNTER — Encounter: Payer: Self-pay | Admitting: Physician Assistant

## 2024-07-03 VITALS — BP 117/62 | HR 77 | Resp 14 | Ht 61.5 in | Wt 153.4 lb

## 2024-07-03 DIAGNOSIS — N289 Disorder of kidney and ureter, unspecified: Secondary | ICD-10-CM

## 2024-07-03 DIAGNOSIS — R7989 Other specified abnormal findings of blood chemistry: Secondary | ICD-10-CM

## 2024-07-03 DIAGNOSIS — D6489 Other specified anemias: Secondary | ICD-10-CM

## 2024-07-03 DIAGNOSIS — Z09 Encounter for follow-up examination after completed treatment for conditions other than malignant neoplasm: Secondary | ICD-10-CM | POA: Diagnosis not present

## 2024-07-04 LAB — BASIC METABOLIC PANEL WITH GFR
BUN/Creatinine Ratio: 20 (ref 12–28)
BUN: 23 mg/dL (ref 8–27)
CO2: 19 mmol/L — ABNORMAL LOW (ref 20–29)
Calcium: 9.8 mg/dL (ref 8.7–10.3)
Chloride: 104 mmol/L (ref 96–106)
Creatinine, Ser: 1.13 mg/dL — ABNORMAL HIGH (ref 0.57–1.00)
Glucose: 130 mg/dL — ABNORMAL HIGH (ref 70–99)
Potassium: 4.3 mmol/L (ref 3.5–5.2)
Sodium: 139 mmol/L (ref 134–144)
eGFR: 54 mL/min/1.73 — ABNORMAL LOW (ref >59)

## 2024-07-04 MED ORDER — BELBUCA 150 MCG BU FILM: 1.0000 | ORAL_FILM | Freq: Two times a day (BID) | BUCCAL | 2 refills | Status: AC

## 2024-07-04 NOTE — Telephone Encounter (Signed)
 Ok to increase to 150 mcg BID (which she was previously on) PMP checked

## 2024-07-05 NOTE — Progress Notes (Signed)
 Established patient visit  Patient: Priscilla Meza   DOB: 24-Sep-1956   68 y.o. Female  MRN: [id number] Visit Date: 07/03/2024  Today's healthcare provider: Jolynn Spencer, PA-C   Chief Complaint  Patient presents with   Follow-up    ER f/u 06/17/24 discharge to recheck CBC at PCP.   Subjective     HPI     Follow-up    Additional comments: ER f/u 06/17/24 discharge to recheck CBC at PCP.      Last edited by Wilfred Hargis RAMAN, CMA on 07/03/2024  2:07 PM.       Discussed the use of AI scribe software for clinical note transcription with the patient, who gave verbal consent to proceed.  History of Present Illness Priscilla Meza is a 68 year old female with anemia who presents for follow-up on her hemoglobin levels and potential iron deficiency.  Her hemoglobin was 8.6 g/dL last week, and she feels her energy levels are low. She takes an iron pill every other day. She had a blood transfusion two weeks ago during hospitalization, which she believes improved her hemoglobin levels. She seeks to recheck her hemoglobin to determine if further intervention is needed.  She has experienced black stools, which she associates with gastrointestinal bleeding. Colonoscopy and endoscopy were unremarkable except for a hiatal hernia. No active bleeding was found. She has a follow-up with her gastroenterologist in November.  She had significant ankle swelling post-hospital discharge, now resolved, and an acute kidney injury during hospitalization. She is scheduled for oral surgery on Wednesday and wants to ensure her hemoglobin levels are adequate for the procedure.       07/03/2024    2:21 PM 05/30/2024    8:14 AM 02/29/2024    8:14 AM  Depression screen PHQ 2/9  Decreased Interest 0 0 0  Down, Depressed, Hopeless 0 0 0  PHQ - 2 Score 0 0 0  Altered sleeping 0    Tired, decreased energy 1    Change in appetite 0    Feeling bad or failure about yourself  0    Trouble concentrating 0    Moving  slowly or fidgety/restless 0    Suicidal thoughts 0    PHQ-9 Score 1    Difficult doing work/chores Not difficult at all        07/03/2024    2:21 PM 12/31/2023    8:22 AM 11/19/2023    9:00 AM 10/19/2023    9:33 AM  GAD 7 : Generalized Anxiety Score  Nervous, Anxious, on Edge 0 0 0 0  Control/stop worrying 0 0 0 0  Worry too much - different things 0 0 0 0  Trouble relaxing 0 0 0 0  Restless 0 0 0 0  Easily annoyed or irritable 0 0 0 0  Afraid - awful might happen 0 0 0 0  Total GAD 7 Score 0 0 0 0  Anxiety Difficulty  Not difficult at all Not difficult at all Not difficult at all    Medications: Outpatient Medications Prior to Visit  Medication Sig   ferrous sulfate  325 (65 FE) MG EC tablet Take 1 tablet (325 mg total) by mouth every other day.   hydrochlorothiazide  (MICROZIDE ) 12.5 MG capsule TAKE 1 CAPSULE(12.5 MG) BY MOUTH DAILY   [EXPIRED] HYDROcodone -acetaminophen  (NORCO/VICODIN) 5-325 MG tablet Take 1 tablet by mouth every 6 (six) hours as needed for severe pain (pain score 7-10). Must last 30 days.   HYDROcodone -acetaminophen  (NORCO/VICODIN) 5-325 MG tablet  Take 1 tablet by mouth every 6 (six) hours as needed for severe pain (pain score 7-10). Must last 30 days.   [START ON 08/02/2024] HYDROcodone -acetaminophen  (NORCO/VICODIN) 5-325 MG tablet Take 1 tablet by mouth every 6 (six) hours as needed for severe pain (pain score 7-10). Must last 30 days.   lisinopril  (ZESTRIL ) 20 MG tablet Take 2 tablets (40 mg total) by mouth daily.   pantoprazole  (PROTONIX ) 40 MG tablet Take 1 tablet (40 mg total) by mouth 2 (two) times daily before a meal.   rosuvastatin  (CRESTOR ) 40 MG tablet TAKE 1 TABLET(40 MG) BY MOUTH DAILY   [DISCONTINUED] BELBUCA  75 MCG FILM Take 75 mcg by mouth daily.   No facility-administered medications prior to visit.    Review of Systems All negative Except see HPI       Objective    BP 117/62 (BP Location: Left Arm, Patient Position: Sitting, Cuff Size:  Normal)   Pulse 77   Resp 14   Ht 5' 1.5 (1.562 m)   Wt 153 lb 6.4 oz (69.6 kg)   SpO2 97%   BMI 28.52 kg/m     Physical Exam Vitals reviewed.  Constitutional:      General: She is not in acute distress.    Appearance: Normal appearance. She is well-developed. She is not diaphoretic.  HENT:     Head: Normocephalic and atraumatic.  Eyes:     General: No scleral icterus.    Conjunctiva/sclera: Conjunctivae normal.  Neck:     Thyroid: No thyromegaly.  Cardiovascular:     Rate and Rhythm: Normal rate and regular rhythm.     Pulses: Normal pulses.     Heart sounds: Normal heart sounds. No murmur heard. Pulmonary:     Effort: Pulmonary effort is normal. No respiratory distress.     Breath sounds: Normal breath sounds. No wheezing, rhonchi or rales.  Musculoskeletal:     Cervical back: Neck supple.     Right lower leg: No edema.     Left lower leg: No edema.  Lymphadenopathy:     Cervical: No cervical adenopathy.  Skin:    General: Skin is warm and dry.     Findings: No rash.  Neurological:     Mental Status: She is alert and oriented to person, place, and time. Mental status is at baseline.  Psychiatric:        Mood and Affect: Mood normal.        Behavior: Behavior normal.      Results for orders placed or performed in visit on 07/03/24  Basic Metabolic Panel (BMET)  Result Value Ref Range   Glucose 130 (H) 70 - 99 mg/dL   BUN 23 8 - 27 mg/dL   Creatinine, Ser 8.86 (H) 0.57 - 1.00 mg/dL   eGFR 54 (L) >40 fO/fpw/8.26   BUN/Creatinine Ratio 20 12 - 28   Sodium 139 134 - 144 mmol/L   Potassium 4.3 3.5 - 5.2 mmol/L   Chloride 104 96 - 106 mmol/L   CO2 19 (L) 20 - 29 mmol/L   Calcium  9.8 8.7 - 10.3 mg/dL        Assessment & Plan Hospital discharge follow-up Admit date 06/17/24 Discharge date 06/19/24 TOC date 06/20/24 Was advised to follow-up with pcp and gi Was seen for acute upper GI bleed In the setting of htn, hypercholesteremia, systolic murmur, chronic  pain syndrome, ASCVD, borderline DM, AKI, acute blood loss anemia melena, polyp of colon  Anemia with history of gastrointestinal  bleeding and hiatal hernia Anemia likely due to gastrointestinal bleeding, possibly from a hiatal hernia. Hemoglobin was 8.6 last week. Reports melena, indicating possible upper gastrointestinal bleeding. Recent colonoscopy and endoscopy were unremarkable except for the hiatal hernia. No active bleeding found. Had a blood transfusion two weeks ago. Energy levels improved but not optimal. Oral iron supplements taken every other day. Discussed monitoring hemoglobin and iron levels for potential further intervention. Discussed potential hematology referral if hemoglobin does not improve. - Order CBC and iron studies to assess hemoglobin and iron levels. - Advise follow-up with gastroenterology and sign up for cancellation list to expedite appointment. - Consider referral to hematology if hemoglobin levels do not improve.  Acute kidney injury, resolved Acute kidney injury resolved. No current swelling. Previous ankle swelling subsided. Discussed monitoring kidney function to prevent recurrence. - Order BMP to assess kidney function and electrolytes.  Abnormal CBC (Primary) Anemia due to other cause, not classified - CBC with Differential/Platelet - Basic Metabolic Panel (BMET)  Kidney problem  - Basic Metabolic Panel (BMET)   Orders Placed This Encounter  Procedures   CBC with Differential/Platelet   Basic Metabolic Panel (BMET)    No follow-ups on file.   The patient was advised to call back or seek an in-person evaluation if the symptoms worsen or if the condition fails to improve as anticipated.  I discussed the assessment and treatment plan with the patient. The patient was provided an opportunity to ask questions and all were answered. The patient agreed with the plan and demonstrated an understanding of the instructions.  I, Corbett Moulder, PA-C have  reviewed all documentation for this visit. The documentation on 07/03/2024  for the exam, diagnosis, procedures, and orders are all accurate and complete.  Jolynn Spencer, [facility], MMS [facility] [phone] (phone) [phone] (fax)  [facility]

## 2024-07-06 ENCOUNTER — Ambulatory Visit: Payer: Self-pay | Admitting: Physician Assistant

## 2024-07-06 ENCOUNTER — Other Ambulatory Visit: Payer: Self-pay | Admitting: *Deleted

## 2024-07-06 DIAGNOSIS — R531 Weakness: Secondary | ICD-10-CM

## 2024-07-06 NOTE — Progress Notes (Signed)
 Please, check with [facility] at A1c test for her hyperglycemia

## 2024-07-08 DIAGNOSIS — Z09 Encounter for follow-up examination after completed treatment for conditions other than malignant neoplasm: Secondary | ICD-10-CM | POA: Insufficient documentation

## 2024-07-11 NOTE — Addendum Note (Signed)
 Addended by: Korah Hufstedler on: 07/11/2024 06:21 AM   Modules accepted: Level of Service

## 2024-07-11 NOTE — Progress Notes (Signed)
 Seen by patient Priscilla Meza on 07/06/2024  5:14 PM

## 2024-07-12 ENCOUNTER — Telehealth: Payer: Self-pay

## 2024-07-12 DIAGNOSIS — R7989 Other specified abnormal findings of blood chemistry: Secondary | ICD-10-CM | POA: Diagnosis not present

## 2024-07-12 LAB — ABO/RH: ABO/RH(D): O NEG

## 2024-07-12 NOTE — Telephone Encounter (Signed)
 Copied from CRM [id number]. Topic: Clinical - Request for Lab/Test Order >> Jul 12, 2024  1:12 PM Wess RAMAN wrote: Reason for CRM: Patient would like a CBC lab test order placed  Callback #: [phone]

## 2024-07-13 LAB — CBC WITH DIFFERENTIAL/PLATELET
Basophils Absolute: 0 x10E3/uL (ref 0.0–0.2)
Basos: 0 %
EOS (ABSOLUTE): 0.1 x10E3/uL (ref 0.0–0.4)
Eos: 1 %
Hematocrit: 32.1 % — ABNORMAL LOW (ref 34.0–46.6)
Hemoglobin: 10.4 g/dL — ABNORMAL LOW (ref 11.1–15.9)
Immature Grans (Abs): 0.1 x10E3/uL (ref 0.0–0.1)
Immature Granulocytes: 1 %
Lymphocytes Absolute: 4 x10E3/uL — ABNORMAL HIGH (ref 0.7–3.1)
Lymphs: 26 %
MCH: 30.1 pg (ref 26.6–33.0)
MCHC: 32.4 g/dL (ref 31.5–35.7)
MCV: 93 fL (ref 79–97)
Monocytes Absolute: 1.4 x10E3/uL — ABNORMAL HIGH (ref 0.1–0.9)
Monocytes: 9 %
Neutrophils Absolute: 9.7 x10E3/uL — ABNORMAL HIGH (ref 1.4–7.0)
Neutrophils: 63 %
Platelets: 344 x10E3/uL (ref 150–450)
RBC: 3.45 x10E6/uL — ABNORMAL LOW (ref 3.77–5.28)
RDW: 13.7 % (ref 11.7–15.4)
WBC: 15.4 x10E3/uL — ABNORMAL HIGH (ref 3.4–10.8)

## 2024-08-14 ENCOUNTER — Ambulatory Visit (INDEPENDENT_AMBULATORY_CARE_PROVIDER_SITE_OTHER): Admitting: Family Medicine

## 2024-08-14 ENCOUNTER — Encounter: Payer: Self-pay | Admitting: Family Medicine

## 2024-08-14 VITALS — BP 157/68 | HR 80 | Resp 16 | Ht 61.5 in | Wt 155.0 lb

## 2024-08-14 DIAGNOSIS — Z87898 Personal history of other specified conditions: Secondary | ICD-10-CM | POA: Diagnosis not present

## 2024-08-14 DIAGNOSIS — I10 Essential (primary) hypertension: Secondary | ICD-10-CM | POA: Diagnosis not present

## 2024-08-14 DIAGNOSIS — N182 Chronic kidney disease, stage 2 (mild): Secondary | ICD-10-CM

## 2024-08-14 DIAGNOSIS — D62 Acute posthemorrhagic anemia: Secondary | ICD-10-CM | POA: Diagnosis not present

## 2024-08-14 DIAGNOSIS — R600 Localized edema: Secondary | ICD-10-CM | POA: Diagnosis not present

## 2024-08-14 DIAGNOSIS — Z13820 Encounter for screening for osteoporosis: Secondary | ICD-10-CM

## 2024-08-14 DIAGNOSIS — I2510 Atherosclerotic heart disease of native coronary artery without angina pectoris: Secondary | ICD-10-CM | POA: Diagnosis not present

## 2024-08-14 DIAGNOSIS — Z780 Asymptomatic menopausal state: Secondary | ICD-10-CM

## 2024-08-14 MED ORDER — ROSUVASTATIN CALCIUM 40 MG PO TABS: 40.0000 mg | ORAL_TABLET | Freq: Every day | ORAL | 3 refills | Status: AC

## 2024-08-14 MED ORDER — LISINOPRIL 40 MG PO TABS: 40.0000 mg | ORAL_TABLET | Freq: Every day | ORAL | 3 refills | Status: AC

## 2024-08-14 MED ORDER — HYDROCHLOROTHIAZIDE 12.5 MG PO CAPS: 12.5000 mg | ORAL_CAPSULE | Freq: Every day | ORAL | 0 refills | Status: DC

## 2024-08-14 NOTE — Progress Notes (Signed)
 Established patient visit   Patient: Priscilla Meza   DOB: Mar 18, 1956   68 y.o. Female  MRN: [id number] Visit Date: 08/14/2024  Today's healthcare provider: LAURAINE LOISE BUOY, DO   Chief Complaint  Patient presents with   Transitions Of Care    Patient will get Covid and Flu Vaccine at work.   Medical Management of Chronic Issues    Needs medication refill   Subjective    HPI Priscilla Meza is a 68 year old female with hypertension who presents for follow-up on blood pressure management.  She ran out of her hydrochlorothiazide  about a week ago and has been taking one lisinopril  instead of two due to running out of medication. She is now completely out of both medications. Her blood pressure was very high during a recent hospitalization, where it fluctuated significantly, dropping to very low levels and then spiking high again. She reports that she had lost a lot of blood during that hospitalization due to an upper gastrointestinal bleed. She has been on blood pressure medications for three years without prior issues.  She is in the middle of dental surgery, which is affecting her speech. She is currently waiting for her gums to heal before receiving permanent implants.  She has been taking rosuvastatin  without any side effects and was recently put back on pantoprazole  during her hospital stay for a GI bleed. She also takes Norco and Belbuca  for pain management.  She has been taking iron supplements due to anemia during her hospitalization, although she is unsure if she still needs the iron as it causes queasiness. Her hemoglobin was low during hospitalization but has improved since then.  She notes that her stools are sometimes dark, which could be due to the iron or potential bleeding. Her energy levels have been stable, and she is two months post-hospitalization.  She takes vitamin B12 occasionally for energy, especially after her hospital stay, but often forgets to take it.  She is currently taking Protonix  once a day, although it was prescribed twice daily.  No chest pain, shortness of breath, dizziness, or lightheadedness. Reports occasional dark stools.      Medications: Outpatient Medications Prior to Visit  Medication Sig Note   BELBUCA  150 MCG FILM SMARTSIG:1 Strip(s) By Mouth Every 12 Hours    chlorhexidine (PERIDEX) 0.12 % solution     cyanocobalamin (VITAMIN B12) 1000 MCG tablet Take 1,000 mcg by mouth daily.    ferrous sulfate  325 (65 FE) MG EC tablet Take 1 tablet (325 mg total) by mouth every other day.    HYDROcodone -acetaminophen  (NORCO/VICODIN) 5-325 MG tablet Take 1 tablet by mouth every 6 (six) hours as needed for severe pain (pain score 7-10). Must last 30 days.    pantoprazole  (PROTONIX ) 40 MG tablet Take 1 tablet (40 mg total) by mouth 2 (two) times daily before a meal.    [DISCONTINUED] hydrochlorothiazide  (MICROZIDE ) 12.5 MG capsule TAKE 1 CAPSULE(12.5 MG) BY MOUTH DAILY    [DISCONTINUED] lisinopril  (ZESTRIL ) 20 MG tablet Take 2 tablets (40 mg total) by mouth daily. 08/14/2024: 2 20mg  tabs -> 1 40 mg tab   [DISCONTINUED] rosuvastatin  (CRESTOR ) 40 MG tablet TAKE 1 TABLET(40 MG) BY MOUTH DAILY    No facility-administered medications prior to visit.        Objective    BP (!) 157/68 (BP Location: Right Arm, Patient Position: Sitting, Cuff Size: Normal) Comment: Patient reports have been out of medication  Pulse 80   Resp  16   Ht 5' 1.5 (1.562 m)   Wt 155 lb (70.3 kg)   SpO2 97%   BMI 28.81 kg/m     Physical Exam Constitutional:      Appearance: Normal appearance.  HENT:     Head: Normocephalic and atraumatic.  Eyes:     General: No scleral icterus.    Extraocular Movements: Extraocular movements intact.     Conjunctiva/sclera: Conjunctivae normal.  Cardiovascular:     Rate and Rhythm: Normal rate and regular rhythm.     Pulses: Normal pulses.     Heart sounds: Normal heart sounds.  Pulmonary:     Effort: Pulmonary  effort is normal. No respiratory distress.     Breath sounds: Normal breath sounds.  Musculoskeletal:     Right lower leg: No edema.     Left lower leg: No edema.  Skin:    General: Skin is warm and dry.  Neurological:     Mental Status: She is alert and oriented to person, place, and time. Mental status is at baseline.  Psychiatric:        Mood and Affect: Mood normal.        Behavior: Behavior normal.      No results found for any visits on 08/14/24.  Assessment & Plan    Primary hypertension -     hydroCHLOROthiazide ; Take 1 capsule (12.5 mg total) by mouth daily.  Dispense: 90 capsule; Refill: 0 -     Lisinopril ; Take 1 tablet (40 mg total) by mouth daily.  Dispense: 90 tablet; Refill: 3  Bilateral lower extremity edema -     hydroCHLOROthiazide ; Take 1 capsule (12.5 mg total) by mouth daily.  Dispense: 90 capsule; Refill: 0  Arteriosclerotic cardiovascular disease (ASCVD) -     Rosuvastatin  Calcium ; Take 1 tablet (40 mg total) by mouth daily.  Dispense: 90 tablet; Refill: 3  Chronic kidney disease, stage 2, mildly decreased GFR -     Basic metabolic panel with GFR  Anemia associated with acute blood loss -     CBC  Encounter for osteoporosis screening in asymptomatic postmenopausal patient -     DG Bone Density; Future    Primary hypertension; bilateral lower extremity edema Chronic, recent lability. Blood pressure variable due to recent GI bleed and running out of medication. Historically well-controlled on current regimen.  Previously placed on hydrochlorothiazide  due to bilateral lower extremity edema - Refilled lisinopril  and hydrochlorothiazide  prescriptions. - Switched lisinopril  to 40 mg tablet once daily.  Anemia associated with acute blood loss; history of GI bleed Anemia secondary to recent GI bleed. Hemoglobin improved post-hospitalization. Continues on oral iron supplementation with queasiness. - Ordered CBC to assess current hemoglobin and iron  levels.  Atherosclerotic cardiovascular disease (ASCVD) No angina symptoms. Continues on rosuvastatin  without side effects. - Refilled rosuvastatin  prescription.  Chronic kidney disease, stage 2 Recent acute kidney injury during GI bleed and associated hospitalization.  Recheck BMP.  Continue to optimize risk factors.  Proton pump inhibitor therapy for gastrointestinal bleed history Reinitiated on pantoprazole  during hospitalization for GI bleed. Advised to continue indefinitely to prevent recurrence.  Dental surgery (in progress) Undergoing dental implant surgery, currently in healing phase. Temporary dental appliances in place.     Return in about 6 months (around 02/11/2025) for CPE.      I discussed the assessment and treatment plan with the patient  The patient was provided an opportunity to ask questions and all were answered. The patient agreed with the plan  and demonstrated an understanding of the instructions.   The patient was advised to call back or seek an in-person evaluation if the symptoms worsen or if the condition fails to improve as anticipated.    LAURAINE LOISE BUOY, DO  [facility] [phone] (phone) [phone] (fax)  [facility]

## 2024-08-14 NOTE — Patient Instructions (Addendum)
 Please call the [facility] [phone]) to schedule a routine screening bone density scan.  Recommended vaccines: Shingles (shingrix), Pneumonia (prevnar-20 and -21), and Tdap (tetanus, diphtheria and pertussis)

## 2024-08-15 ENCOUNTER — Encounter: Admitting: Student in an Organized Health Care Education/Training Program

## 2024-08-15 LAB — CBC
Hematocrit: 36.8 % (ref 34.0–46.6)
Hemoglobin: 11.7 g/dL (ref 11.1–15.9)
MCH: 29.6 pg (ref 26.6–33.0)
MCHC: 31.8 g/dL (ref 31.5–35.7)
MCV: 93 fL (ref 79–97)
Platelets: 287 x10E3/uL (ref 150–450)
RBC: 3.95 x10E6/uL (ref 3.77–5.28)
RDW: 13.1 % (ref 11.7–15.4)
WBC: 9.9 x10E3/uL (ref 3.4–10.8)

## 2024-08-15 LAB — BASIC METABOLIC PANEL WITH GFR
BUN/Creatinine Ratio: 21 (ref 12–28)
BUN: 21 mg/dL (ref 8–27)
CO2: 22 mmol/L (ref 20–29)
Calcium: 9.5 mg/dL (ref 8.7–10.3)
Chloride: 106 mmol/L (ref 96–106)
Creatinine, Ser: 0.98 mg/dL (ref 0.57–1.00)
Glucose: 90 mg/dL (ref 70–99)
Potassium: 4.4 mmol/L (ref 3.5–5.2)
Sodium: 142 mmol/L (ref 134–144)
eGFR: 64 mL/min/1.73 (ref >59)

## 2024-08-16 ENCOUNTER — Encounter: Payer: Self-pay | Admitting: Nurse Practitioner

## 2024-08-16 ENCOUNTER — Ambulatory Visit: Attending: Nurse Practitioner | Admitting: Nurse Practitioner

## 2024-08-16 VITALS — BP 156/64 | HR 96 | Temp 97.0°F | Resp 18 | Ht 61.5 in | Wt 153.0 lb

## 2024-08-16 DIAGNOSIS — M545 Low back pain, unspecified: Secondary | ICD-10-CM | POA: Insufficient documentation

## 2024-08-16 DIAGNOSIS — M47816 Spondylosis without myelopathy or radiculopathy, lumbar region: Secondary | ICD-10-CM | POA: Diagnosis not present

## 2024-08-16 DIAGNOSIS — Z79891 Long term (current) use of opiate analgesic: Secondary | ICD-10-CM | POA: Insufficient documentation

## 2024-08-16 DIAGNOSIS — Z79899 Other long term (current) drug therapy: Secondary | ICD-10-CM | POA: Insufficient documentation

## 2024-08-16 DIAGNOSIS — G8929 Other chronic pain: Secondary | ICD-10-CM | POA: Insufficient documentation

## 2024-08-16 DIAGNOSIS — G894 Chronic pain syndrome: Secondary | ICD-10-CM | POA: Diagnosis not present

## 2024-08-16 DIAGNOSIS — Z0289 Encounter for other administrative examinations: Secondary | ICD-10-CM | POA: Insufficient documentation

## 2024-08-16 MED ORDER — HYDROCODONE-ACETAMINOPHEN 5-325 MG PO TABS: 1.0000 | ORAL_TABLET | Freq: Four times a day (QID) | ORAL | 0 refills | Status: AC | PRN

## 2024-08-16 MED ORDER — BELBUCA 150 MCG BU FILM: 1.0000 | ORAL_FILM | Freq: Two times a day (BID) | BUCCAL | 2 refills | Status: AC | PRN

## 2024-08-16 MED ORDER — NALOXONE HCL 4 MG/0.1ML NA LIQD: 1.0000 | NASAL | 1 refills | Status: AC | PRN

## 2024-08-16 MED ORDER — HYDROCODONE-ACETAMINOPHEN 5-325 MG PO TABS: 1.0000 | ORAL_TABLET | Freq: Four times a day (QID) | ORAL | 0 refills | Status: DC | PRN

## 2024-08-16 NOTE — Progress Notes (Signed)
 PROVIDER NOTE: Interpretation of information contained herein should be left to medically-trained personnel. Specific patient instructions are provided elsewhere under Patient Instructions section of medical record. This document was created in part using AI and STT-dictation technology, any transcriptional errors that may result from this process are unintentional.  Patient: Priscilla Meza  Service: E/M   PCP: Donzella Lauraine SAILOR, DO  DOB: 09-27-1956  DOS: 08/16/2024  Provider: Emmy MARLA Blanch, NP  MRN: [id number]  Delivery: Face-to-face  Specialty: Interventional Pain Management  Type: Established Patient  Setting: Ambulatory outpatient facility  Specialty designation: 09  Referring Prov.: Ostwalt, Janna, PA-C  Location: Outpatient office facility       History of present illness (HPI) Priscilla Meza, a 68 y.o. year old female, is here today because of her Chronic pain syndrome [G89.4]. Priscilla Meza primary complain today is Back Pain  Pertinent problems: Priscilla Meza has Chronic bilateral low back pain without sciatica; Chronic narcotic use; Chronic pain syndrome; Spinal stenosis, lumbar region, with neurogenic claudication; Lumbar facet arthropathy; Encounter for long-term opiate analgesic use; Lumbar radiculopathy; and Chronic pain syndrome on their pertinent problem list.   Pain Assessment: Severity of Chronic pain is reported as a 2 /10. Location: Back Mid, Lower/Denies. Onset: More than a month ago. Quality: Aching. Timing: Constant. Modifying factor(s): Medication, Resting. Vitals:  height is 5' 1.5 (1.562 m) and weight is 153 lb (69.4 kg). Her temporal temperature is 97 F (36.1 C) (abnormal). Her blood pressure is 156/64 (abnormal) and her pulse is 96. Her respiration is 18 and oxygen saturation is 99%.  BMI: Estimated body mass index is 28.44 kg/m as calculated from the following:   Height as of this encounter: 5' 1.5 (1.562 m).   Weight as of this encounter: 153 lb (69.4 kg).  Last  encounter: 07/04/2024 Last procedure: Visit date not found.  Reason for encounter: medication management. No change in medical history since last visit.  Patient's pain is at baseline.  Patient continues multimodal pain regimen as prescribed.  States that it provides pain relief and improvement in functional status.  Patient continues struggle with lower back pain; however the current pain medication regimen helped to managing daily functional activities and pain level. Pharmacotherapy Assessment   Belbuca  150 mcg film every 12 hours as needed for pain Hydrocodone -acetaminophen  (Norco/Vicodin) 5-325 mg tablet every 6 hours as needed for pain. MME=20 Monitoring: [place] PMP: PDMP reviewed during this encounter.       Pharmacotherapy: No side-effects or adverse reactions reported. Compliance: No problems identified. Effectiveness: Clinically acceptable.  Erlene Doyal [place], [place]  08/16/2024  9:30 AM  Sign when Signing Visit Nursing Pain Medication Assessment:  Safety precautions to be maintained throughout the outpatient stay will include: orient to surroundings, keep bed in low position, maintain call bell within reach at all times, provide assistance with transfer out of bed and ambulation.  Medication Inspection Compliance: Pill count conducted under aseptic conditions, in front of the patient. Neither the pills nor the bottle was removed from the patient's sight at any time. Once count was completed pills were immediately returned to the patient in their original bottle.  Medication: Belbuca  Pill/Patch Count: 36 of 60 patches remain Pill/Patch Appearance: Markings consistent with prescribed medication Bottle Appearance: Old prescription bottle. Patient reminded that medications should always be kept in the newest prescription bottle. Filled Date: 09 / 03 / 2025 Last Medication intake:  Today  Erlene Doyal [place], [place]  08/16/2024  9:28 AM  Sign when Signing Visit  Nursing Pain Medication Assessment:   Safety precautions to be maintained throughout the outpatient stay will include: orient to surroundings, keep bed in low position, maintain call bell within reach at all times, provide assistance with transfer out of bed and ambulation.  Medication Inspection Compliance: Pill count conducted under aseptic conditions, in front of the patient. Neither the pills nor the bottle was removed from the patient's sight at any time. Once count was completed pills were immediately returned to the patient in their original bottle.  Medication: Hydrocodone /apap Pill/Patch Count: 90 of 120 pills/patches remain Pill/Patch Appearance: Markings consistent with prescribed medication Bottle Appearance: Standard pharmacy container. Clearly labeled. Filled Date: 09 / 04 / 2025 Last Medication intake:  Today    UDS:  Summary  Date Value Ref Range Status  02/29/2024 FINAL  Final    Comment:    ==================================================================== [employer] 13 (MW) ==================================================================== Test                             Result       Flag       Units  Drug Present and Declared for Prescription Verification   Hydrocodone                     2444         EXPECTED   ng/mg creat   Hydromorphone                  202          EXPECTED   ng/mg creat   Dihydrocodeine                 370          EXPECTED   ng/mg creat   Norhydrocodone                 3477         EXPECTED   ng/mg creat    Sources of hydrocodone  include scheduled prescription medications.    Hydromorphone, dihydrocodeine and norhydrocodone are expected    metabolites of hydrocodone . Hydromorphone and dihydrocodeine are    also available as scheduled prescription medications.    Buprenorphine                   67           EXPECTED   ng/mg creat   Norbuprenorphine               73           EXPECTED   ng/mg creat    Source of buprenorphine  is a scheduled prescription medication.     Norbuprenorphine is an expected metabolite of buprenorphine .  ==================================================================== Test                      Result    Flag   Units      Ref Range   Creatinine              66               mg/dL      >=79 ==================================================================== Declared Medications:  The flagging and interpretation on this report are based on the  following declared medications.  Unexpected results may arise from  inaccuracies in the declared medications.   **Note: The testing scope of this panel includes these medications:   Hydrocodone  (Norco)   **Note:  The testing scope of this panel does not include small to  moderate amounts of these reported medications:   Buprenorphine  (Belbuca )   **Note: The testing scope of this panel does not include the  following reported medications:   Acetaminophen  (Norco)  Hydrochlorothiazide  (Microzide )  Lisinopril  (Zestril )  Rosuvastatin  (Crestor ) ==================================================================== For clinical consultation, please call [phone]. ====================================================================     No results found for: CBDTHCR No results found for: D8THCCBX No results found for: D9THCCBX  ROS  Constitutional: Denies any fever or chills Gastrointestinal: No reported hemesis, hematochezia, vomiting, or acute GI distress Musculoskeletal: Low back pain Neurological: No reported episodes of acute onset apraxia, aphasia, dysarthria, agnosia, amnesia, paralysis, loss of coordination, or loss of consciousness  Medication Review  Buprenorphine  HCl, HYDROcodone -acetaminophen , chlorhexidine, cyanocobalamin, ferrous sulfate , hydrochlorothiazide , lisinopril , naloxone , pantoprazole , and rosuvastatin   History Review  Allergy: Priscilla Meza has no known allergies. Drug: Priscilla Meza  reports no history of drug use. Alcohol:  reports no history of  alcohol use. Tobacco:  reports that she has quit smoking. She has never used smokeless tobacco. Social: Priscilla Meza  reports that she has quit smoking. She has never used smokeless tobacco. She reports that she does not drink alcohol and does not use drugs. Medical:  has a past medical history of Frequent headaches, GERD (gastroesophageal reflux disease), Hypertension, and Urine incontinence. Surgical: Priscilla Meza  has a past surgical history that includes Abdominal hysterectomy (1985); Tubal ligation; Esophagogastroduodenoscopy (N/A, 06/18/2024); Colonoscopy (N/A, 06/18/2024); Givens capsule study (N/A, 06/18/2024); Colonoscopy (N/A, 06/19/2024); and Polypectomy (06/19/2024). Family: family history includes Breast cancer in her maternal aunt; Colon cancer in her maternal grandmother.  Laboratory Chemistry Profile   Renal Lab Results  Component Value Date   BUN 21 08/14/2024   CREATININE 0.98 08/14/2024   BCR 21 08/14/2024   GFRAA 72 01/04/2020   GFRNONAA >60 06/19/2024    Hepatic Lab Results  Component Value Date   AST 19 06/18/2024   ALT 9 06/18/2024   ALBUMIN 2.7 (L) 06/18/2024   ALKPHOS 35 (L) 06/18/2024   LIPASE 137 12/01/2014    Electrolytes Lab Results  Component Value Date   NA 142 08/14/2024   K 4.4 08/14/2024   CL 106 08/14/2024   CALCIUM  9.5 08/14/2024    Bone No results found for: VD25OH, VD125OH2TOT, CI6874NY7, CI7874NY7, 25OHVITD1, 25OHVITD2, 25OHVITD3, TESTOFREE, TESTOSTERONE  Inflammation (CRP: Acute Phase) (ESR: Chronic Phase) No results found for: CRP, ESRSEDRATE, LATICACIDVEN       Note: Above Lab results reviewed.  Recent Imaging Review  DG Lumbar Spine Complete W/Bend CLINICAL DATA:  Low back pain.  EXAM: LUMBAR SPINE - COMPLETE WITH BENDING VIEWS  COMPARISON:  None.  FINDINGS: 5 nonrib bearing lumbar-type vertebral bodies.  Vertebral body heights are maintained. No acute fracture. Generalized osteopenia.  Minimal grade 1  anterolisthesis of L4 on L5 and L5 on S1 secondary to facet disease. Minimal retrolisthesis of L2 on L3. No dynamic listhesis. No spondylolysis.  Degenerative disease with mild disc height loss at T12-L1, L1-2, L2-3 and L4-5. Degenerative disease with disc height loss at L5-S1. Bilateral facet arthropathy at L4-5 and L5-S1.  SI joints are unremarkable.  Abdominal aortic atherosclerosis.  IMPRESSION: 1. Lumbar spine spondylosis as described above. 2. No acute osseous injury of the lumbar spine.  Electronically Signed   By: Julaine Blanch M.D.   On: 12/27/2021 07:26 Note: Reviewed        Physical Exam  Vitals: BP (!) 156/64 (BP Location: Right Arm, Patient Position: Sitting)  Pulse 96   Temp (!) 97 F (36.1 C) (Temporal)   Resp 18   Ht 5' 1.5 (1.562 m)   Wt 153 lb (69.4 kg)   SpO2 99%   BMI 28.44 kg/m  BMI: Estimated body mass index is 28.44 kg/m as calculated from the following:   Height as of this encounter: 5' 1.5 (1.562 m).   Weight as of this encounter: 153 lb (69.4 kg). Ideal: Ideal body weight: 48.9 kg (107 lb 14.6 oz) Adjusted ideal body weight: 57.1 kg (125 lb 15.2 oz) General appearance: Well nourished, well developed, and well hydrated. In no apparent acute distress Mental status: Alert, oriented x 3 (person, place, & time)       Respiratory: No evidence of acute respiratory distress Eyes: PERLA   Assessment   Diagnosis Status  1. Chronic pain syndrome   2. Pain management contract signed   3. Encounter for long-term opiate analgesic use   4. Medication management   5. Lumbar facet arthropathy   6. Lumbar spondylosis   7. Chronic bilateral low back pain without sciatica    Controlled Controlled Controlled   Updated Problems: No problems updated.  Plan of Care  Problem-specific:  Assessment and Plan  The patient will continue the current medication regimen.  Prescribing drug monitoring (PDMP) was reviewed, showing findings consistent with the  prescribed use and no evidence of narcotic misuse or abuse.  Urine drug screening (UDS) up-to-date.  No additional concerns reported at this visit.  No side effects or adverse reaction reported at this visit.  A follow-up appointment is scheduled in 90 days for medication management   Priscilla Meza has a current medication list which includes the following long-term medication(s): ferrous sulfate , hydrochlorothiazide , lisinopril , pantoprazole , and rosuvastatin .  Pharmacotherapy (Medications Ordered): Meds ordered this encounter  Medications   HYDROcodone -acetaminophen  (NORCO/VICODIN) 5-325 MG tablet    Sig: Take 1 tablet by mouth every 6 (six) hours as needed for severe pain (pain score 7-10). Must last 30 days.    Dispense:  120 tablet    Refill:  0    Chronic Pain: STOP Act (Not applicable) Fill 1 day early if closed on refill date. Avoid benzodiazepines within 8 hours of opioids   HYDROcodone -acetaminophen  (NORCO/VICODIN) 5-325 MG tablet    Sig: Take 1 tablet by mouth every 6 (six) hours as needed for severe pain (pain score 7-10). Must last 30 days.    Dispense:  120 tablet    Refill:  0    Chronic Pain: STOP Act (Not applicable) Fill 1 day early if closed on refill date. Avoid benzodiazepines within 8 hours of opioids   HYDROcodone -acetaminophen  (NORCO/VICODIN) 5-325 MG tablet    Sig: Take 1 tablet by mouth every 6 (six) hours as needed for severe pain (pain score 7-10). Must last 30 days.    Dispense:  120 tablet    Refill:  0    Chronic Pain: STOP Act (Not applicable) Fill 1 day early if closed on refill date. Avoid benzodiazepines within 8 hours of opioids   BELBUCA  150 MCG FILM    Sig: Place 1 Film inside cheek every 12 (twelve) hours as needed.    Dispense:  60 each    Refill:  2   naloxone  (NARCAN ) nasal spray 4 mg/0.1 mL    Sig: Place 1 spray into the nose as needed for up to 365 doses (for opioid-induced respiratory depresssion). In case of emergency (overdose), spray once  into each nostril. If no  response within 3 minutes, repeat application and call 911.    Dispense:  1 each    Refill:  1    Instruct patient in proper use of device.   Orders:  No orders of the defined types were placed in this encounter.       Return in about 3 months (around 11/15/2024) for (F2F), (MM), Emmy Blanch NP.    Recent Visits Date Type Provider Dept  05/30/24 Office Visit Marcelino Nurse, MD [facility]  Showing recent visits within past 90 days and meeting all other requirements Today's Visits Date Type Provider Dept  08/16/24 Office Visit Dustina Scoggin K, NP [facility]  Showing today's visits and meeting all other requirements Future Appointments Date Type Provider Dept  11/08/24 Appointment Sapphira Harjo K, NP [facility]  Showing future appointments within next 90 days and meeting all other requirements  I discussed the assessment and treatment plan with the patient. The patient was provided an opportunity to ask questions and all were answered. The patient agreed with the plan and demonstrated an understanding of the instructions.  Patient advised to call back or seek an in-person evaluation if the symptoms or condition worsens.  I personally spent a total of 30 minutes in the care of the patient today including preparing to see the patient, getting/reviewing separately obtained history, performing a medically appropriate exam/evaluation, counseling and educating, placing orders, documenting clinical information in the EHR, and independently interpreting results.  Duration of encounter:  minutes.  Total time on encounter, as per AMA guidelines included both the face-to-face and non-face-to-face time personally spent by the physician and/or other qualified health care professional(s) on the day of the encounter (includes time in activities that require the physician or other qualified health care professional and does not include time in  activities normally performed by clinical staff). Physician's time may include the following activities when performed: Preparing to see the patient (e.g., pre-charting review of records, searching for previously ordered imaging, lab work, and nerve conduction tests) Review of prior analgesic pharmacotherapies. Reviewing PMP Interpreting ordered tests (e.g., lab work, imaging, nerve conduction tests) Performing post-procedure evaluations, including interpretation of diagnostic procedures Obtaining and/or reviewing separately obtained history Performing a medically appropriate examination and/or evaluation Counseling and educating the patient/family/caregiver Ordering medications, tests, or procedures Referring and communicating with other health care professionals (when not separately reported) Documenting clinical information in the electronic or other health record Independently interpreting results (not separately reported) and communicating results to the patient/ family/caregiver Care coordination (not separately reported)  Note by: Caisen Mangas K Inioluwa Baris, NP (TTS and AI technology used. I apologize for any typographical errors that were not detected and corrected.) Date: 08/16/2024; Time: 10:08 AM

## 2024-08-16 NOTE — Progress Notes (Signed)
 Nursing Pain Medication Assessment:  Safety precautions to be maintained throughout the outpatient stay will include: orient to surroundings, keep bed in low position, maintain call bell within reach at all times, provide assistance with transfer out of bed and ambulation.  Medication Inspection Compliance: Pill count conducted under aseptic conditions, in front of the patient. Neither the pills nor the bottle was removed from the patient's sight at any time. Once count was completed pills were immediately returned to the patient in their original bottle.  Medication: Hydrocodone /apap Pill/Patch Count: 90 of 120 pills/patches remain Pill/Patch Appearance: Markings consistent with prescribed medication Bottle Appearance: Standard pharmacy container. Clearly labeled. Filled Date: 09 / 04 / 2025 Last Medication intake:  Today

## 2024-08-16 NOTE — Progress Notes (Signed)
 Nursing Pain Medication Assessment:  Safety precautions to be maintained throughout the outpatient stay will include: orient to surroundings, keep bed in low position, maintain call bell within reach at all times, provide assistance with transfer out of bed and ambulation.  Medication Inspection Compliance: Pill count conducted under aseptic conditions, in front of the patient. Neither the pills nor the bottle was removed from the patient's sight at any time. Once count was completed pills were immediately returned to the patient in their original bottle.  Medication: Belbuca  Pill/Patch Count: 36 of 60 patches remain Pill/Patch Appearance: Markings consistent with prescribed medication Bottle Appearance: Old prescription bottle. Patient reminded that medications should always be kept in the newest prescription bottle. Filled Date: 09 / 03 / 2025 Last Medication intake:  Today

## 2024-08-23 DIAGNOSIS — Z23 Encounter for immunization: Secondary | ICD-10-CM | POA: Diagnosis not present

## 2024-08-24 ENCOUNTER — Encounter: Admitting: Student in an Organized Health Care Education/Training Program

## 2024-08-25 ENCOUNTER — Telehealth: Payer: Self-pay

## 2024-08-25 ENCOUNTER — Ambulatory Visit: Payer: Self-pay | Admitting: Family Medicine

## 2024-08-28 NOTE — Telephone Encounter (Signed)
 Error

## 2024-10-09 DIAGNOSIS — K449 Diaphragmatic hernia without obstruction or gangrene: Secondary | ICD-10-CM | POA: Diagnosis not present

## 2024-10-09 DIAGNOSIS — K219 Gastro-esophageal reflux disease without esophagitis: Secondary | ICD-10-CM | POA: Diagnosis not present

## 2024-10-09 DIAGNOSIS — Z862 Personal history of diseases of the blood and blood-forming organs and certain disorders involving the immune mechanism: Secondary | ICD-10-CM | POA: Diagnosis not present

## 2024-10-27 ENCOUNTER — Telehealth: Payer: Self-pay

## 2024-11-01 ENCOUNTER — Encounter: Payer: Self-pay | Admitting: Family Medicine

## 2024-11-04 DIAGNOSIS — Z79899 Other long term (current) drug therapy: Secondary | ICD-10-CM | POA: Insufficient documentation

## 2024-11-04 DIAGNOSIS — M47816 Spondylosis without myelopathy or radiculopathy, lumbar region: Secondary | ICD-10-CM | POA: Insufficient documentation

## 2024-11-07 NOTE — Progress Notes (Unsigned)
 PROVIDER NOTE: Interpretation of information contained herein should be left to medically-trained personnel. Specific patient instructions are provided elsewhere under Patient Instructions section of medical record. This document was created in part using AI and STT-dictation technology, any transcriptional errors that may result from this process are unintentional.  Patient: Priscilla Meza  Service: E/M   PCP: Donzella Lauraine SAILOR, DO  DOB: Apr 02, 1956  DOS: 11/08/2024  Provider: Emmy MARLA Blanch, NP  MRN: [id number]  Delivery: Face-to-face  Specialty: Interventional Pain Management  Type: Established Patient  Setting: Ambulatory outpatient facility  Specialty designation: 09  Referring Prov.: Donzella Lauraine SAILOR, DO  Location: Outpatient office facility       History of present illness (HPI) Priscilla Meza, a 68 y.o. year old female, is here today because of her Chronic pain syndrome [G89.4]. Priscilla Meza primary complain today is No chief complaint on file.  Pertinent problems: Priscilla Meza has Chronic bilateral low back pain without sciatica; DDD (degenerative disc disease), lumbar; Chronic narcotic use; Overweight; Chronic pain syndrome; Spinal stenosis, lumbar region, with neurogenic claudication; Lumbar facet arthropathy; Encounter for long-term opiate analgesic use; Lumbar radiculopathy; and Pain management contract signed on their pertinent problem list.  Pain Assessment: Severity of   is reported as a  /10. Location:    / . Onset:  . Quality:  . Timing:  . Modifying factor(s):  SABRA Vitals:  vitals were not taken for this visit.  BMI: Estimated body mass index is 28.44 kg/m as calculated from the following:   Height as of 08/16/24: 5' 1.5 (1.562 m).   Weight as of 08/16/24: 153 lb (69.4 kg).  Last encounter: 08/16/2024. Last procedure: Visit date not found.  Reason for encounter: medication management. No change in medical history since last visit.  Patient's pain is at baseline.  Patient continues  multimodal pain regimen as prescribed.  States that it provides pain relief and improvement in functional status.   Discussed the use of AI scribe software for clinical note transcription with the patient, who gave verbal consent to proceed.  History of Present Illness           Pharmacotherapy Assessment   Belbuca  150 mcg film every 12 hours as needed for pain Hydrocodone -acetaminophen  (Norco/Vicodin) 5-325 mg tablet every 6 hours as needed for pain. MME=20 Monitoring: [place] PMP: PDMP reviewed during this encounter.       Pharmacotherapy: No side-effects or adverse reactions reported. Compliance: No problems identified. Effectiveness: Clinically acceptable.  No notes on file  UDS:  Summary  Date Value Ref Range Status  02/29/2024 FINAL  Final    Comment:    ==================================================================== [employer] Select 13 (MW) ==================================================================== Test                             Result       Flag       Units  Drug Present and Declared for Prescription Verification   Hydrocodone                     2444         EXPECTED   ng/mg creat   Hydromorphone                  202          EXPECTED   ng/mg creat   Dihydrocodeine  370          EXPECTED   ng/mg creat   Norhydrocodone                 3477         EXPECTED   ng/mg creat    Sources of hydrocodone  include scheduled prescription medications.    Hydromorphone, dihydrocodeine and norhydrocodone are expected    metabolites of hydrocodone . Hydromorphone and dihydrocodeine are    also available as scheduled prescription medications.    Buprenorphine                   67           EXPECTED   ng/mg creat   Norbuprenorphine               73           EXPECTED   ng/mg creat    Source of buprenorphine  is a scheduled prescription medication.    Norbuprenorphine is an expected metabolite of  buprenorphine .  ==================================================================== Test                      Result    Flag   Units      Ref Range   Creatinine              66               mg/dL      >=79 ==================================================================== Declared Medications:  The flagging and interpretation on this report are based on the  following declared medications.  Unexpected results may arise from  inaccuracies in the declared medications.   **Note: The testing scope of this panel includes these medications:   Hydrocodone  (Norco)   **Note: The testing scope of this panel does not include small to  moderate amounts of these reported medications:   Buprenorphine  (Belbuca )   **Note: The testing scope of this panel does not include the  following reported medications:   Acetaminophen  (Norco)  Hydrochlorothiazide  (Microzide )  Lisinopril  (Zestril )  Rosuvastatin  (Crestor ) ==================================================================== For clinical consultation, please call [phone]. ====================================================================     No results found for: CBDTHCR No results found for: D8THCCBX No results found for: D9THCCBX  ROS  Constitutional: Denies any fever or chills Gastrointestinal: No reported hemesis, hematochezia, vomiting, or acute GI distress Musculoskeletal: Denies any acute onset joint swelling, redness, loss of ROM, or weakness Neurological: No reported episodes of acute onset apraxia, aphasia, dysarthria, agnosia, amnesia, paralysis, loss of coordination, or loss of consciousness  Medication Review  HYDROcodone -acetaminophen , chlorhexidine, cyanocobalamin, ferrous sulfate , hydrochlorothiazide , lisinopril , naloxone , pantoprazole , and rosuvastatin   History Review  Allergy: Priscilla Meza has no known allergies. Drug: Priscilla Meza  reports no history of drug use. Alcohol:  reports no history of alcohol  use. Tobacco:  reports that she has quit smoking. She has never used smokeless tobacco. Social: Priscilla Meza  reports that she has quit smoking. She has never used smokeless tobacco. She reports that she does not drink alcohol and does not use drugs. Medical:  has a past medical history of Frequent headaches, GERD (gastroesophageal reflux disease), Hypertension, and Urine incontinence. Surgical: Priscilla Meza  has a past surgical history that includes Abdominal hysterectomy (1985); Tubal ligation; Esophagogastroduodenoscopy (N/A, 06/18/2024); Colonoscopy (N/A, 06/18/2024); Givens capsule study (N/A, 06/18/2024); Colonoscopy (N/A, 06/19/2024); and Polypectomy (06/19/2024). Family: family history includes Breast cancer in her maternal aunt; Colon cancer in her maternal grandmother.  Laboratory Chemistry Profile   Renal Lab  Results  Component Value Date   BUN 21 08/14/2024   CREATININE 0.98 08/14/2024   BCR 21 08/14/2024   GFRAA 72 01/04/2020   GFRNONAA >60 06/19/2024    Hepatic Lab Results  Component Value Date   AST 19 06/18/2024   ALT 9 06/18/2024   ALBUMIN 2.7 (L) 06/18/2024   ALKPHOS 35 (L) 06/18/2024   LIPASE 137 12/01/2014    Electrolytes Lab Results  Component Value Date   NA 142 08/14/2024   K 4.4 08/14/2024   CL 106 08/14/2024   CALCIUM  9.5 08/14/2024    Bone No results found for: VD25OH, VD125OH2TOT, CI6874NY7, CI7874NY7, 25OHVITD1, 25OHVITD2, 25OHVITD3, TESTOFREE, TESTOSTERONE  Inflammation (CRP: Acute Phase) (ESR: Chronic Phase) No results found for: CRP, ESRSEDRATE, LATICACIDVEN       Note: Above Lab results reviewed.  Recent Imaging Review  DG Lumbar Spine Complete W/Bend CLINICAL DATA:  Low back pain.  EXAM: LUMBAR SPINE - COMPLETE WITH BENDING VIEWS  COMPARISON:  None.  FINDINGS: 5 nonrib bearing lumbar-type vertebral bodies.  Vertebral body heights are maintained. No acute fracture. Generalized osteopenia.  Minimal grade 1  anterolisthesis of L4 on L5 and L5 on S1 secondary to facet disease. Minimal retrolisthesis of L2 on L3. No dynamic listhesis. No spondylolysis.  Degenerative disease with mild disc height loss at T12-L1, L1-2, L2-3 and L4-5. Degenerative disease with disc height loss at L5-S1. Bilateral facet arthropathy at L4-5 and L5-S1.  SI joints are unremarkable.  Abdominal aortic atherosclerosis.  IMPRESSION: 1. Lumbar spine spondylosis as described above. 2. No acute osseous injury of the lumbar spine.  Electronically Signed   By: Julaine Blanch M.D.   On: 12/27/2021 07:26 Note: Reviewed        Physical Exam  Vitals: There were no vitals taken for this visit. BMI: Estimated body mass index is 28.44 kg/m as calculated from the following:   Height as of 08/16/24: 5' 1.5 (1.562 m).   Weight as of 08/16/24: 153 lb (69.4 kg). Ideal: Patient weight not recorded General appearance: Well nourished, well developed, and well hydrated. In no apparent acute distress Mental status: Alert, oriented x 3 (person, place, & time)       Respiratory: No evidence of acute respiratory distress Eyes: PERLA   Assessment   Diagnosis Status  1. Chronic pain syndrome   2. Pain management contract signed   3. Encounter for long-term opiate analgesic use   4. Medication management   5. Lumbar facet arthropathy   6. Lumbar spondylosis   7. Chronic bilateral low back pain without sciatica    Controlled Controlled Controlled   Updated Problems: No problems updated.  Plan of Care  Problem-specific:  Assessment and Plan            Priscilla Meza has a current medication list which includes the following long-term medication(s): ferrous sulfate , hydrochlorothiazide , lisinopril , pantoprazole , and rosuvastatin .  Pharmacotherapy (Medications Ordered): No orders of the defined types were placed in this encounter.  Orders:  No orders of the defined types were placed in this encounter.    {There is  no content from the last Plan section.}   No follow-ups on file.    Recent Visits Date Type Provider Dept  08/16/24 Office Visit Chantay Whitelock K, NP [facility]  Showing recent visits within past 90 days and meeting Meza other requirements Future Appointments Date Type Provider Dept  11/08/24 Appointment Christophere Hillhouse K, NP [facility]  Showing future appointments within next 90  days and meeting Meza other requirements  I discussed the assessment and treatment plan with the patient. The patient was provided an opportunity to ask questions and Meza were answered. The patient agreed with the plan and demonstrated an understanding of the instructions.  Patient advised to call back or seek an in-person evaluation if the symptoms or condition worsens.  Duration of encounter: *** minutes.  Total time on encounter, as per AMA guidelines included both the face-to-face and non-face-to-face time personally spent by the physician and/or other qualified health care professional(s) on the day of the encounter (includes time in activities that require the physician or other qualified health care professional and does not include time in activities normally performed by clinical staff). Physician's time may include the following activities when performed: Preparing to see the patient (e.g., pre-charting review of records, searching for previously ordered imaging, lab work, and nerve conduction tests) Review of prior analgesic pharmacotherapies. Reviewing PMP Interpreting ordered tests (e.g., lab work, imaging, nerve conduction tests) Performing post-procedure evaluations, including interpretation of diagnostic procedures Obtaining and/or reviewing separately obtained history Performing a medically appropriate examination and/or evaluation Counseling and educating the patient/family/caregiver Ordering medications, tests, or procedures Referring and communicating with other health care  professionals (when not separately reported) Documenting clinical information in the electronic or other health record Independently interpreting results (not separately reported) and communicating results to the patient/ family/caregiver Care coordination (not separately reported)  Note by: Eisen Robenson K Magdalene Tardiff, NP (TTS and AI technology used. I apologize for any typographical errors that were not detected and corrected.) Date: 11/08/2024; Time: 1:09 PM

## 2024-11-08 ENCOUNTER — Ambulatory Visit: Attending: Nurse Practitioner | Admitting: Nurse Practitioner

## 2024-11-08 VITALS — BP 160/83 | HR 89 | Temp 96.8°F | Resp 16 | Ht 61.5 in | Wt 158.0 lb

## 2024-11-08 DIAGNOSIS — M47816 Spondylosis without myelopathy or radiculopathy, lumbar region: Secondary | ICD-10-CM

## 2024-11-08 DIAGNOSIS — Z79891 Long term (current) use of opiate analgesic: Secondary | ICD-10-CM

## 2024-11-08 DIAGNOSIS — G8929 Other chronic pain: Secondary | ICD-10-CM | POA: Diagnosis not present

## 2024-11-08 DIAGNOSIS — Z79899 Other long term (current) drug therapy: Secondary | ICD-10-CM

## 2024-11-08 DIAGNOSIS — G894 Chronic pain syndrome: Secondary | ICD-10-CM | POA: Insufficient documentation

## 2024-11-08 DIAGNOSIS — Z0289 Encounter for other administrative examinations: Secondary | ICD-10-CM | POA: Diagnosis not present

## 2024-11-08 DIAGNOSIS — M545 Low back pain, unspecified: Secondary | ICD-10-CM | POA: Insufficient documentation

## 2024-11-08 MED ORDER — HYDROCODONE-ACETAMINOPHEN 5-325 MG PO TABS: 1.0000 | ORAL_TABLET | Freq: Four times a day (QID) | ORAL | 0 refills | Status: AC | PRN

## 2024-11-08 MED ORDER — BELBUCA 150 MCG BU FILM: 1.0000 | ORAL_FILM | Freq: Two times a day (BID) | BUCCAL | 2 refills | Status: AC | PRN

## 2024-11-08 NOTE — Progress Notes (Signed)
 Nursing Pain Medication Assessment:  Safety precautions to be maintained throughout the outpatient stay will include: orient to surroundings, keep bed in low position, maintain call bell within reach at all times, provide assistance with transfer out of bed and ambulation.  Medication Inspection Compliance: Pill count conducted under aseptic conditions, in front of the patient. Neither the pills nor the bottle was removed from the patient's sight at any time. Once count was completed pills were immediately returned to the patient in their original bottle.  Medication: Hydrocodone /APAP Pill/Patch Count: 107/120 Pill/Patch Appearance: Markings consistent with prescribed medication Bottle Appearance: Standard pharmacy container. Clearly labeled. Filled Date: 46 / 4 / 2025 Last Medication intake:  Today  Belbucca 51/60 patches remain Filled 10/29/24 Last used one today.  Correct Belbucca box was brought to last appointment on 08/16/24.

## 2024-11-10 ENCOUNTER — Other Ambulatory Visit: Payer: Self-pay | Admitting: Family Medicine

## 2024-11-10 DIAGNOSIS — R600 Localized edema: Secondary | ICD-10-CM

## 2024-11-10 DIAGNOSIS — I10 Essential (primary) hypertension: Secondary | ICD-10-CM

## 2024-11-20 ENCOUNTER — Ambulatory Visit (INDEPENDENT_AMBULATORY_CARE_PROVIDER_SITE_OTHER): Admitting: Family Medicine

## 2024-11-20 ENCOUNTER — Encounter: Payer: Self-pay | Admitting: Family Medicine

## 2024-11-20 VITALS — BP 150/78 | HR 64 | Temp 98.1°F | Ht 61.5 in | Wt 163.2 lb

## 2024-11-20 DIAGNOSIS — Z23 Encounter for immunization: Secondary | ICD-10-CM

## 2024-11-20 DIAGNOSIS — Z713 Dietary counseling and surveillance: Secondary | ICD-10-CM

## 2024-11-20 DIAGNOSIS — E66811 Obesity, class 1: Secondary | ICD-10-CM

## 2024-11-20 DIAGNOSIS — Z6830 Body mass index (BMI) 30.0-30.9, adult: Secondary | ICD-10-CM

## 2024-11-20 MED ORDER — WEGOVY 0.25 MG/0.5ML ~~LOC~~ SOAJ: 0.2500 mg | SUBCUTANEOUS | 0 refills | Status: DC

## 2024-11-20 NOTE — Patient Instructions (Addendum)
 Please call the [facility] [phone]) to schedule a routine screening mammogram.   Increase your intake of fresh/frozen fruits and vegetables.  The Mediterranean diet is a great reference for meal ideas.  I strongly encourage you to incorporate exercise into your daily routine (at least 30 minutes most days of the week) and monitor your dietary intake. - I encourage you to measure/weigh your foods/beverages for a few days to get a more accurate idea of what different amounts of things look like on your plate or in your glass.  - Using smaller plates/glasses also helps in that it tricks our minds into thinking we've eaten more than we have. Studies have shown that we eat more when presented with larger plates, even with the same amount of food on them.   - Plan to eat until you are no longer hungry, rather than until you're full, with a daily caloric limit of 1600 calories.  If you don't normally exercise, start with something simple or more enjoyable. You can plan to walk for your half hour or do something like dancing if you enjoy it.  Build up your activity over time; this will make it more enjoyable and reduce your risk of injury.  Here are some videos which offer a variety of different workout classes with different levels: [email or web address].[email or web address]  It is completely free. If a full video is too much for you to do, you can do them in bite-sized chunks (just pausing when needed and resuming later in the day).   Even if these actions don't ultimately lead to weight loss, increasing your activity will still help to reduce your insulin resistance and will improve your cardiovascular health (making heart attack/stroke less likely as you age).

## 2024-11-20 NOTE — Progress Notes (Signed)
 "     Established patient visit   Patient: Priscilla Meza   DOB: 03-31-1956   67 y.o. Female  MRN: [id number] Visit Date: 11/20/2024  Today's healthcare provider: LAURAINE LOISE BUOY, DO   Chief Complaint  Patient presents with   Weight Loss    Patient is here today to discuss weight loss medications.  Shingles- yes Declined other vaccines   Subjective    HPI Priscilla Meza is a 68 year old female who presents with difficulty losing weight and seeking weight loss medication options.  She has been unable to lose weight despite previous success with low-carb diets and describes this as the heaviest she has ever been, feeling 'miserable.' Her attempts to exercise on a treadmill are limited by significant back pain, which worsens with increased weight, creating a 'vicious cycle.'  She is currently taking lisinopril  and hydrochlorothiazide  for hypertension. Her blood pressure was unexpectedly high this morning, although it has been controlled in the past. She stopped monitoring it regularly after it stabilized, with the last recorded normal blood pressure being last month at a GI doctor's appointment.  She has a history of a GI bleed in July, for which she received a blood transfusion. She also mentions a past experience with shingles and a family history of severe shingles in her father.  She lives alone and works from home, contributing to a sedentary lifestyle. She describes minimal daily physical activity, often not leaving her house for days at a time, and expresses a need for motivation to increase her activity level, especially during the winter months when it is dark after work.  She has tried Wellbutrin in the past to quit smoking but did not tolerate it well. No history of seizures, pancreatitis, medullary thyroid cancer, or multiple endocrine neoplasia type two.       Medications: Show/hide medication list[1]       Objective    BP (!) 150/78 (BP Location: Left Arm,  Patient Position: Sitting, Cuff Size: Large)   Pulse 64   Temp 98.1 F (36.7 C) (Oral)   Ht 5' 1.5 (1.562 m)   Wt 163 lb 3.2 oz (74 kg)   SpO2 99%   BMI 30.34 kg/m     Physical Exam Vitals and nursing note reviewed.  Constitutional:      General: She is not in acute distress.    Appearance: Normal appearance.  HENT:     Head: Normocephalic and atraumatic.  Eyes:     General: No scleral icterus.    Conjunctiva/sclera: Conjunctivae normal.  Cardiovascular:     Rate and Rhythm: Normal rate.  Pulmonary:     Effort: Pulmonary effort is normal.  Neurological:     Mental Status: She is alert and oriented to person, place, and time. Mental status is at baseline.  Psychiatric:        Mood and Affect: Mood normal.        Behavior: Behavior normal.      No results found for any visits on 11/20/24.  Assessment & Plan    Obesity (BMI 30.0-34.9) -     Wegovy ; Inject 0.25 mg into the skin once a week.  Dispense: 2 mL; Refill: 0  Weight loss counseling, encounter for  Need for shingles vaccine -     Varicella-zoster vaccine IM      Obesity (BMI 30.0-34.9); weight loss counseling, encounter for Chronic obesity with difficulty losing weight. Discussed weight loss medications, including GLP-1 agonists and topiramate. Phentermine  contraindicated due to hypertension. Explained potential side effects of GLP-1 agonists. Emphasized monitoring food intake and exercise motivation. She expressed interest in Wegovy . - Prescribed Wegovy  with self-pay option starting at $200 for two months. - Provided information on calorie limit of 1600 calories per day. - Encouraged use of Couch [employer] energy for exercise motivation. - Will follow up in six weeks to assess progress.  Essential hypertension Hypertension managed with lisinopril  and hydrochlorothiazide . Recent elevated blood pressure reading. She has not been monitoring blood pressure at home. - Encouraged resumption of home blood  pressure monitoring. - Continue lisinopril  40 mg daily and hydrochlorothiazide  12.5 mg daily  General Health Maintenance Due for bone density scan. She plans to schedule it after the first of the year. - Provided phone number for scheduling bone density scan.    Return in about 6 weeks (around 01/01/2025) for Weight.      I discussed the assessment and treatment plan with the patient  The patient was provided an opportunity to ask questions and all were answered. The patient agreed with the plan and demonstrated an understanding of the instructions.   The patient was advised to call back or seek an in-person evaluation if the symptoms worsen or if the condition fails to improve as anticipated.    LAURAINE LOISE BUOY, DO  [facility] [phone] (phone) [phone] (fax)  [facility]    [1]  Outpatient Medications Prior to Visit  Medication Sig Note   [START ON 12/01/2024] Buprenorphine  HCl (BELBUCA ) 150 MCG FILM Place 1 Film inside cheek every 12 (twelve) hours as needed.    cyanocobalamin (VITAMIN B12) 1000 MCG tablet Take 1,000 mcg by mouth daily.    hydrochlorothiazide  (MICROZIDE ) 12.5 MG capsule TAKE 1 CAPSULE(12.5 MG) BY MOUTH DAILY    [START ON 12/03/2024] HYDROcodone -acetaminophen  (NORCO/VICODIN) 5-325 MG tablet Take 1 tablet by mouth every 6 (six) hours as needed for severe pain (pain score 7-10). Must last 30 days.    [START ON 01/02/2025] HYDROcodone -acetaminophen  (NORCO/VICODIN) 5-325 MG tablet Take 1 tablet by mouth every 6 (six) hours as needed for severe pain (pain score 7-10). Must last 30 days.    [START ON 02/01/2025] HYDROcodone -acetaminophen  (NORCO/VICODIN) 5-325 MG tablet Take 1 tablet by mouth every 6 (six) hours as needed for severe pain (pain score 7-10). Must last 30 days.    lisinopril  (ZESTRIL ) 40 MG tablet Take 1 tablet (40 mg total) by mouth daily.    naloxone  (NARCAN ) nasal spray 4 mg/0.1 mL Place 1 spray into the nose as  needed for up to 365 doses (for opioid-induced respiratory depresssion). In case of emergency (overdose), spray once into each nostril. If no response within 3 minutes, repeat application and call 911.    pantoprazole  (PROTONIX ) 40 MG tablet Take 1 tablet (40 mg total) by mouth 2 (two) times daily before a meal.    rosuvastatin  (CRESTOR ) 40 MG tablet Take 1 tablet (40 mg total) by mouth daily.    [DISCONTINUED] chlorhexidine (PERIDEX) 0.12 % solution     [DISCONTINUED] ferrous sulfate  325 (65 FE) MG EC tablet Take 1 tablet (325 mg total) by mouth every other day. 11/20/2024: GI doc took her off it   No facility-administered medications prior to visit.   "

## 2024-12-15 ENCOUNTER — Ambulatory Visit: Admitting: Family Medicine

## 2024-12-22 ENCOUNTER — Other Ambulatory Visit: Payer: Self-pay | Admitting: Obstetrics and Gynecology

## 2025-01-01 ENCOUNTER — Ambulatory Visit: Payer: Self-pay | Admitting: Family Medicine

## 2025-01-02 ENCOUNTER — Ambulatory Visit: Admitting: Family Medicine

## 2025-01-05 ENCOUNTER — Encounter: Payer: Self-pay | Admitting: Family Medicine

## 2025-01-05 ENCOUNTER — Ambulatory Visit: Admitting: Family Medicine

## 2025-01-05 VITALS — BP 137/75 | HR 78 | Temp 98.2°F | Ht 61.5 in | Wt 161.1 lb

## 2025-01-05 DIAGNOSIS — I2510 Atherosclerotic heart disease of native coronary artery without angina pectoris: Secondary | ICD-10-CM

## 2025-01-05 DIAGNOSIS — I10 Essential (primary) hypertension: Secondary | ICD-10-CM

## 2025-01-05 DIAGNOSIS — K219 Gastro-esophageal reflux disease without esophagitis: Secondary | ICD-10-CM

## 2025-01-05 DIAGNOSIS — D6489 Other specified anemias: Secondary | ICD-10-CM

## 2025-01-05 DIAGNOSIS — R7303 Prediabetes: Secondary | ICD-10-CM

## 2025-01-05 DIAGNOSIS — G894 Chronic pain syndrome: Secondary | ICD-10-CM

## 2025-01-05 DIAGNOSIS — F1190 Opioid use, unspecified, uncomplicated: Secondary | ICD-10-CM

## 2025-01-05 DIAGNOSIS — E66811 Obesity, class 1: Secondary | ICD-10-CM

## 2025-01-05 DIAGNOSIS — E7800 Pure hypercholesterolemia, unspecified: Secondary | ICD-10-CM

## 2025-01-05 DIAGNOSIS — R011 Cardiac murmur, unspecified: Secondary | ICD-10-CM

## 2025-01-05 DIAGNOSIS — Z13820 Encounter for screening for osteoporosis: Secondary | ICD-10-CM

## 2025-01-05 DIAGNOSIS — M47816 Spondylosis without myelopathy or radiculopathy, lumbar region: Secondary | ICD-10-CM

## 2025-01-05 MED ORDER — WEGOVY 1 MG/0.5ML ~~LOC~~ SOAJ: 1.0000 mg | SUBCUTANEOUS | 1 refills | Status: AC

## 2025-01-05 MED ORDER — WEGOVY 0.5 MG/0.5ML ~~LOC~~ SOAJ: 0.5000 mg | SUBCUTANEOUS | 0 refills | Status: AC

## 2025-01-05 NOTE — Progress Notes (Signed)
 "     Established patient visit   Patient: Priscilla Meza   DOB: Mar 30, 1956   69 y.o. Female  MRN: [id number] Visit Date: 01/05/2025  Today's healthcare provider: LAURAINE LOISE BUOY, DO   Chief Complaint  Patient presents with   Weight Check    Patient is here for a follow up on weight, was prescribed Wegovy  on 11/20/2024.  Reports that she has not had any side effects from the started dosage, wants to discuss the pills.    Bone Density- declined for now   Subjective    HPI Priscilla Meza is a 69 year old female who presents for a follow-up regarding her weight management with Wegovy .  She is currently on Wegovy  for weight management and has not experienced any side effects such as gas, diarrhea, nausea, or vomiting. Over the past six weeks, she has lost two pounds. She is using a sample provided and has two weeks left of her current supply. She plans to increase her dose to 0.5 mg soon. Her insurance does not cover the medication, and she currently pays $200/prescription out-of-pocket.  Her physical activity includes using a treadmill for at least 30 minutes a day, often broken into smaller intervals due to her busy work schedule. Her biggest challenge is inactivity and insufficient water intake. She attempts to balance her Diet Coke consumption with water throughout the day.  She is on rosuvastatin  40 mg daily for cholesterol management. She was previously on atorvastatin  but had a lapse in medication in 2024. She also started taking vitamin B12 on her own a few months ago to boost energy, as she did not realize that she had high levels in July. She plans to stop taking it until her next lab results.  Her diet consists of three small meals a day with occasional snacks like cheese puffs. Overeating leads to discomfort, which she attributes to the effects of Wegovy .  No nausea, vomiting, diarrhea, constipation, or unpleasant burps. No issues with the Wegovy  injections and she prefers  them over the pill form.      Medications: Show/hide medication list[1]       Objective    BP 137/75 (BP Location: Right Arm, Patient Position: Sitting)   Pulse 78   Temp 98.2 F (36.8 C) (Oral)   Ht 5' 1.5 (1.562 m)   Wt 161 lb 1.6 oz (73.1 kg)   SpO2 96%   BMI 29.95 kg/m     Physical Exam Vitals and nursing note reviewed.  Constitutional:      General: She is not in acute distress.    Appearance: Normal appearance.  HENT:     Head: Normocephalic and atraumatic.  Eyes:     General: No scleral icterus.    Conjunctiva/sclera: Conjunctivae normal.  Cardiovascular:     Rate and Rhythm: Normal rate.  Pulmonary:     Effort: Pulmonary effort is normal.  Neurological:     Mental Status: She is alert and oriented to person, place, and time. Mental status is at baseline.  Psychiatric:        Mood and Affect: Mood normal.        Behavior: Behavior normal.      No results found for any visits on 01/05/25.  Assessment & Plan    Obesity (BMI 30.0-34.9) -     Comprehensive metabolic panel with GFR -     Wegovy ; Inject 0.5 mg into the skin once a week.  Dispense: 2 mL; Refill: 0 -  Wegovy ; Inject 1 mg into the skin once a week.  Dispense: 2 mL; Refill: 1  Hypercholesteremia -     Comprehensive metabolic panel with GFR -     Lipid Panel With LDL/HDL Ratio  Borderline diabetes -     Comprehensive metabolic panel with GFR -     Hemoglobin A1c  Anemia due to other cause, not classified -     CBC with Differential/Platelet    Obesity (BMI 30.0-34.9) Chronic, currently managed with Wegovy . No side effects. Weight loss of 2 pounds over 6 weeks. Current dose 0.25 mg, increasing to 0.5 mg. Prefers injections to continue over oral medication for the time being. Aware of cost and side effects. - Increased Wegovy  dose to 0.5 mg. - Sent prescription for Wegovy  0.5 mg dose and delayed fill for Wegovy  1 mg dose.  Hypercholesterolemia Chronic managed with rosuvastatin   40 mg daily. Patient had not been taking prescribed medication for a period prior to her appointment in early 2025, but resumed at that point. Blood work planned to assess cholesterol levels. - Ordered fasting blood work to assess cholesterol levels.  Anemia due to other cause, not classified Vitamin B12 supplementation initiated by her. Previous high B12 levels noted. Plan to reassess B12 levels with upcoming blood work. - Ordered blood work to assess B12 levels. - Advised to stop B12 supplementation until lab results are reviewed.  Borderline diabetes Will recheck hemoglobin A1c today.    Return in about 6 weeks (around 02/15/2025) for CPE, poss mAWV, as scheduled.      I discussed the assessment and treatment plan with the patient  The patient was provided an opportunity to ask questions and all were answered. The patient agreed with the plan and demonstrated an understanding of the instructions.   The patient was advised to call back or seek an in-person evaluation if the symptoms worsen or if the condition fails to improve as anticipated.    LAURAINE LOISE BUOY, DO  [facility] [phone] (phone) [phone] (fax)  [facility]    [1]  Outpatient Medications Prior to Visit  Medication Sig   Buprenorphine  HCl (BELBUCA ) 150 MCG FILM Place 1 Film inside cheek every 12 (twelve) hours as needed.   cyanocobalamin (VITAMIN B12) 1000 MCG tablet Take 1,000 mcg by mouth daily.   hydrochlorothiazide  (MICROZIDE ) 12.5 MG capsule TAKE 1 CAPSULE(12.5 MG) BY MOUTH DAILY   HYDROcodone -acetaminophen  (NORCO/VICODIN) 5-325 MG tablet Take 1 tablet by mouth every 6 (six) hours as needed for severe pain (pain score 7-10). Must last 30 days.   [START ON 02/01/2025] HYDROcodone -acetaminophen  (NORCO/VICODIN) 5-325 MG tablet Take 1 tablet by mouth every 6 (six) hours as needed for severe pain (pain score 7-10). Must last 30 days.   lisinopril  (ZESTRIL ) 40 MG tablet  Take 1 tablet (40 mg total) by mouth daily.   naloxone  (NARCAN ) nasal spray 4 mg/0.1 mL Place 1 spray into the nose as needed for up to 365 doses (for opioid-induced respiratory depresssion). In case of emergency (overdose), spray once into each nostril. If no response within 3 minutes, repeat application and call 911.   pantoprazole  (PROTONIX ) 40 MG tablet Take 1 tablet (40 mg total) by mouth 2 (two) times daily before a meal.   rosuvastatin  (CRESTOR ) 40 MG tablet Take 1 tablet (40 mg total) by mouth daily.   [DISCONTINUED] semaglutide -weight management (WEGOVY ) 0.25 MG/0.5ML SOAJ SQ injection Inject 0.25 mg into the skin once a week.   No facility-administered medications  prior to visit.   "

## 2025-01-29 ENCOUNTER — Ambulatory Visit: Admitting: Family Medicine

## 2025-01-30 ENCOUNTER — Encounter: Admitting: Nurse Practitioner

## 2025-02-15 ENCOUNTER — Encounter: Admitting: Family Medicine

## 2025-02-19 ENCOUNTER — Encounter: Admitting: Nurse Practitioner
